# Patient Record
Sex: Female | Born: 1980 | Race: White | Hispanic: No | Marital: Single | Smoking: Current every day smoker
Health system: Southern US, Community
[De-identification: ages and names within clinical notes are randomized; demographics above are authoritative.]

## PROBLEM LIST (undated history)

## (undated) DIAGNOSIS — F419 Anxiety disorder, unspecified: Secondary | ICD-10-CM

## (undated) DIAGNOSIS — Z5189 Encounter for other specified aftercare: Secondary | ICD-10-CM

## (undated) DIAGNOSIS — M545 Low back pain: Secondary | ICD-10-CM

## (undated) HISTORY — PX: VAGINAL HYSTERECTOMY: SUR661

## (undated) HISTORY — DX: Low back pain: M54.5

## (undated) HISTORY — PX: CHOLECYSTECTOMY: SHX55

## (undated) HISTORY — DX: Anxiety disorder, unspecified: F41.9

## (undated) HISTORY — PX: DIAGNOSTIC LAPAROSCOPY: SUR761

## (undated) HISTORY — PX: FOOT SURGERY: SHX648

## (undated) HISTORY — DX: Encounter for other specified aftercare: Z51.89

---

## 2005-07-29 ENCOUNTER — Ambulatory Visit: Payer: Self-pay | Admitting: Unknown Physician Specialty

## 2005-09-19 ENCOUNTER — Emergency Department: Payer: Self-pay | Admitting: Emergency Medicine

## 2006-02-19 ENCOUNTER — Encounter: Payer: Self-pay | Admitting: Family Medicine

## 2006-05-22 ENCOUNTER — Encounter: Payer: Self-pay | Admitting: Family Medicine

## 2006-08-07 ENCOUNTER — Emergency Department: Payer: Self-pay | Admitting: Emergency Medicine

## 2008-06-05 ENCOUNTER — Encounter: Payer: Self-pay | Admitting: Family Medicine

## 2009-06-27 ENCOUNTER — Emergency Department: Payer: Self-pay | Admitting: Emergency Medicine

## 2009-11-05 ENCOUNTER — Ambulatory Visit (HOSPITAL_COMMUNITY): Admission: RE | Admit: 2009-11-05 | Discharge: 2009-11-05 | Payer: Self-pay | Admitting: Specialist

## 2010-03-20 ENCOUNTER — Encounter: Payer: Self-pay | Admitting: Family Medicine

## 2010-03-20 ENCOUNTER — Emergency Department: Payer: Self-pay | Admitting: Emergency Medicine

## 2010-10-25 ENCOUNTER — Other Ambulatory Visit: Payer: Self-pay | Admitting: Family Medicine

## 2010-10-25 ENCOUNTER — Ambulatory Visit (INDEPENDENT_AMBULATORY_CARE_PROVIDER_SITE_OTHER): Payer: PRIVATE HEALTH INSURANCE | Admitting: Family Medicine

## 2010-10-25 ENCOUNTER — Encounter: Payer: Self-pay | Admitting: Family Medicine

## 2010-10-25 DIAGNOSIS — R599 Enlarged lymph nodes, unspecified: Secondary | ICD-10-CM

## 2010-10-25 DIAGNOSIS — E119 Type 2 diabetes mellitus without complications: Secondary | ICD-10-CM

## 2010-10-25 DIAGNOSIS — E0865 Diabetes mellitus due to underlying condition with hyperglycemia: Secondary | ICD-10-CM | POA: Insufficient documentation

## 2010-10-25 DIAGNOSIS — R221 Localized swelling, mass and lump, neck: Secondary | ICD-10-CM

## 2010-10-25 DIAGNOSIS — Z136 Encounter for screening for cardiovascular disorders: Secondary | ICD-10-CM

## 2010-10-25 DIAGNOSIS — R220 Localized swelling, mass and lump, head: Secondary | ICD-10-CM

## 2010-10-25 LAB — LIPID PANEL
Cholesterol: 170 mg/dL (ref 0–200)
HDL: 34 mg/dL — ABNORMAL LOW (ref >39.00)
LDL Cholesterol: 110 mg/dL — ABNORMAL HIGH (ref 0–99)
Total CHOL/HDL Ratio: 5
Triglycerides: 128 mg/dL (ref 0.0–149.0)
VLDL: 25.6 mg/dL (ref 0.0–40.0)

## 2010-10-25 LAB — BASIC METABOLIC PANEL
BUN: 12 mg/dL (ref 6–23)
CO2: 27 mEq/L (ref 19–32)
Calcium: 9.4 mg/dL (ref 8.4–10.5)
Chloride: 101 mEq/L (ref 96–112)
Creatinine, Ser: 0.9 mg/dL (ref 0.4–1.2)
GFR: 76.36 mL/min (ref >60.00)
Glucose, Bld: 215 mg/dL — ABNORMAL HIGH (ref 70–99)
Potassium: 4.4 mEq/L (ref 3.5–5.1)
Sodium: 138 mEq/L (ref 135–145)

## 2010-10-25 LAB — HEMOGLOBIN A1C: Hgb A1c MFr Bld: 8.7 % — ABNORMAL HIGH (ref 4.6–6.5)

## 2010-10-29 ENCOUNTER — Ambulatory Visit: Payer: Self-pay | Admitting: Family Medicine

## 2010-10-29 ENCOUNTER — Encounter: Payer: Self-pay | Admitting: Family Medicine

## 2010-10-31 ENCOUNTER — Encounter: Payer: Self-pay | Admitting: Family Medicine

## 2010-10-31 NOTE — Assessment & Plan Note (Signed)
Summary: NEW PT TO EST/CHECK LUMP UNDER R ARM/CLE     [facility],MAILED NPP   Vital Signs:  Patient profile:   30 year old female Height:      72 inches Weight:      275.50 pounds BMI:     37.50 Temp:     98.3 degrees F oral Pulse rate:   72 / minute Pulse rhythm:   regular BP sitting:   130 / 80  (right arm) Cuff size:   large  Vitals Entered By: Linde Gillis CMA Duncan Dull) (October 25, 2010 10:45 AM) CC: new patient, check lump under right arm   History of Present Illness: 30 yo here to establish care.  1.  Lump under right axilla- noticed it a few weeks ago.  Not painful, no erythema, non fluctuant.  Went to PCP, placed her on Doxycycline which had no affect on it.  No increase in size.  She is concerned because her mother died of breast CA in her 70s and her mother's sister died at 10 of breast CA.   Nitara has never had a mammogram. We do not know hormone or BRCA status of her mother, but Kjerstin was checked for BRCA and was neg.  She has not noticed any lumps in breast.  No changes in her nipples or discharge. No fevers or chills.  2.  Lump on right side of her neck.  Has been there for years and at times can be very painful.  No erythema or warmth.  Has also been on doxycycline for this which has had no impact on it either.  She does not think it has grown in size.  3.  h/o DM- diagnosed with DM and was on Metformin combo mediation.  It upset her stomach.  Went to bariatric clinic, on phenteramine, lost weight and was able to control her DM with diet.  She stopped the phenteramine and gained some weight back.  Just restarted it last month and has lost 10 pounds.  Denies any increased thirst or urination.  Preventive Screening-Counseling & Management  Alcohol-Tobacco     Smoking Status: never  Current Medications (verified): 1)  Phentermine Hcl 37.5 Mg Caps (Phentermine Hcl) .... 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 Eyes:  Denies blurring. ENT:  Denies difficulty swallowing. CV:  Denies chest pain or discomfort. Resp:  Denies shortness of breath. GI:  Denies abdominal pain, change in bowel habits, nausea, and vomiting. GU:  Denies discharge and dysuria. MS:  Denies joint pain, joint redness, and joint swelling. Derm:  Denies rash. Neuro:  Denies headaches. Psych:  Denies anxiety and depression. Endo:  Denies cold intolerance, heat intolerance, and polyuria.  Heme:  Complains of enlarge lymph nodes; denies fevers.  Physical Exam  General:  alert and overweight-appearing.  alert and overweight-appearing.   Head:  normocephalic, atraumatic, and no abnormalities observed.  normocephalic, atraumatic, and no abnormalities observed.   Eyes:  vision grossly intact, pupils equal, pupils round, and pupils reactive to light.  vision grossly intact, pupils equal, pupils round, and pupils reactive to light.   Ears:  R ear normal and L ear normal.  R ear normal and L ear normal.   Nose:  no external deformity.  no external deformity.   Mouth:  good dentition.  good dentition.   Neck:  supple  and full ROM.   palpable, firm, circular 1.5 cm nodule right sterno-mastoid area.supple and full ROM.   Breasts:  No mass, nodules, thickening, tenderness, bulging, retraction, inflamation, nipple discharge or skin changes noted.   Lungs:  Normal respiratory effort, chest expands symmetrically. Lungs are clear to auscultation, no crackles or wheezes. Heart:  Normal rate and regular rhythm. S1 and S2 normal without gallop, murmur, click, rub or other extra sounds. Abdomen:  Bowel sounds positive,abdomen soft and non-tender without masses, organomegaly or hernias noted. Msk:  No deformity or scoliosis noted of thoracic or lumbar spine.   Extremities:  no edema Neurologic:  No cranial nerve deficits noted. Station and gait are normal. Plantar reflexes are down-going bilaterally. DTRs are symmetrical throughout. Sensory, motor and coordinative functions appear intact. Skin:  Intact without suspicious lesions or rashes Cervical Nodes:  see neck Axillary Nodes:  R axillary LN enlarged, non tender, no erythema.R axillary LN enlarged.   Psych:  Cognition and judgment appear intact. Alert and cooperative with normal attention span and concentration. No apparent delusions, illusions, hallucinations   Impression & Recommendations:  Problem # 1:  DIABETES MELLITUS, TYPE II (ICD-250.00) Assessment Unchanged recheck a1c today.  Likely will need to restart medication and discussed diabetic friendly diet.  Problem # 2:  LYMPHADENOPATHY, RIGHT AXILLA (ICD-785.6) Assessment: New hopefully benign lymphadenopathy.  since it is non tender and with her strong FH of breast CA, will refer for ultrasound/mammogram of her bilateral breast and right axilla.  Pt in agreement with plan. Orders: Radiology Referral (Radiology)  Problem # 3:  NECK MASS (ICD-784.2) Assessment: New Cyst vs reactive node.  Given how long it has been there and now causing pain, will refer for ultrasound. Orders: Radiology Referral  (Radiology)  Complete Medication List: 1)  Phentermine Hcl 37.5 Mg Caps (Phentermine hcl) .... Take one tablet by mouth daily  Other Orders: Venipuncture (34742) TLB-Lipid Panel (80061-LIPID) TLB-BMP (Basic Metabolic Panel-BMET) (80048-METABOL) TLB-A1C / Hgb A1C (Glycohemoglobin) (83036-A1C)  Patient Instructions: 1)  Please stop by to see Shirlee Limerick on your way out. 2)  At your convenience, please make an appointment for a complete physical/pap smear.   Orders Added: 1)  Venipuncture [36415] 2)  TLB-Lipid Panel [80061-LIPID] 3)  TLB-BMP (Basic Metabolic Panel-BMET) [80048-METABOL] 4)  TLB-A1C / Hgb A1C (Glycohemoglobin) [83036-A1C] 5)  Radiology Referral [Radiology] 6)  New Patient Level III [59563]    Current Allergies (reviewed today): No known allergies

## 2010-11-05 ENCOUNTER — Encounter: Payer: Self-pay | Admitting: Family Medicine

## 2010-11-05 ENCOUNTER — Ambulatory Visit: Payer: Self-pay | Admitting: Family Medicine

## 2010-11-05 HISTORY — PX: BREAST BIOPSY: SHX20

## 2010-11-06 ENCOUNTER — Encounter: Payer: Self-pay | Admitting: Family Medicine

## 2010-11-06 DIAGNOSIS — N63 Unspecified lump in unspecified breast: Secondary | ICD-10-CM | POA: Insufficient documentation

## 2010-11-07 ENCOUNTER — Telehealth: Payer: Self-pay | Admitting: Family Medicine

## 2010-11-07 ENCOUNTER — Other Ambulatory Visit: Payer: Self-pay | Admitting: Family Medicine

## 2010-11-07 DIAGNOSIS — N6310 Unspecified lump in the right breast, unspecified quadrant: Secondary | ICD-10-CM

## 2010-11-12 ENCOUNTER — Other Ambulatory Visit: Payer: PRIVATE HEALTH INSURANCE

## 2010-11-12 NOTE — Procedures (Signed)
Summary: Spirometry Report   Spirometry Report   Imported By: Kassie Mends 11/04/2010 10:40:52  _____________________________________________________________________  External Attachment:    Type:   Image     Comment:   External Document

## 2010-11-12 NOTE — Miscellaneous (Signed)
Summary: Orders Update  Clinical Lists Changes  Problems: Added new problem of LUMP OR MASS IN BREAST (ICD-611.72) Orders: Added new Referral order of Surgical Referral (Surgery) - Signed

## 2010-11-12 NOTE — Letter (Signed)
Summary: [facility]   Imported By: Kassie Mends 11/05/2010 08:29:39  _____________________________________________________________________  External Attachment:    Type:   Image     Comment:   External Document

## 2010-11-12 NOTE — Progress Notes (Signed)
  Phone Note From Other Clinic   Caller: Referral Coordinator Summary of Call: Wanted to let you know that the patient is going to go to the [facility] for a double core biopsy on 11/13/2010 at 8:00am. The surgeons office in [place] could not see her for 2 weeks and she didnt want to wait. Patient is aware of the appt and she will bring her MMG disc with her to the appt. Initial call taken by: Carlton Adam,  November 07, 2010 9:00 AM  Follow-up for Phone Call        Thank you. Ruthe Mannan MD  November 07, 2010 9:37 AM

## 2010-11-13 ENCOUNTER — Other Ambulatory Visit: Payer: PRIVATE HEALTH INSURANCE

## 2010-11-13 ENCOUNTER — Other Ambulatory Visit: Payer: Self-pay | Admitting: Family Medicine

## 2010-11-13 ENCOUNTER — Ambulatory Visit
Admission: RE | Admit: 2010-11-13 | Discharge: 2010-11-13 | Disposition: A | Discharge status: Home / Self Care | Payer: PRIVATE HEALTH INSURANCE | Source: Ambulatory Visit | Attending: Family Medicine | Admitting: Family Medicine

## 2010-11-13 ENCOUNTER — Other Ambulatory Visit: Payer: Self-pay | Admitting: Diagnostic Radiology

## 2010-11-13 DIAGNOSIS — N6310 Unspecified lump in the right breast, unspecified quadrant: Secondary | ICD-10-CM

## 2010-12-10 ENCOUNTER — Other Ambulatory Visit: Payer: Self-pay | Admitting: *Deleted

## 2010-12-10 MED ORDER — METFORMIN HCL 500 MG PO TABS: 500.0000 mg | ORAL_TABLET | Freq: Two times a day (BID) | ORAL | Status: DC

## 2011-01-10 ENCOUNTER — Telehealth: Payer: Self-pay | Admitting: *Deleted

## 2011-01-10 MED ORDER — FLUCONAZOLE 150 MG PO TABS: 150.0000 mg | ORAL_TABLET | Freq: Once | ORAL | Status: AC

## 2011-01-10 NOTE — Telephone Encounter (Signed)
Rx sent but if symptoms continue, must be evaluated.

## 2011-01-10 NOTE — Telephone Encounter (Signed)
Pt is complaining of yeast infection- itching, burning, discharge- and requests that diflucan be called to [facility] in [place].

## 2011-01-10 NOTE — Telephone Encounter (Signed)
Patient advised as instructed via telephone. 

## 2011-03-10 ENCOUNTER — Ambulatory Visit (INDEPENDENT_AMBULATORY_CARE_PROVIDER_SITE_OTHER): Payer: PRIVATE HEALTH INSURANCE | Admitting: Family Medicine

## 2011-03-10 ENCOUNTER — Encounter: Payer: Self-pay | Admitting: Family Medicine

## 2011-03-10 VITALS — BP 120/90 | HR 88 | Temp 98.0°F | Ht 71.0 in | Wt 273.0 lb

## 2011-03-10 DIAGNOSIS — M549 Dorsalgia, unspecified: Secondary | ICD-10-CM

## 2011-03-10 MED ORDER — PREDNISONE 20 MG PO TABS: ORAL_TABLET | ORAL | Status: DC

## 2011-03-10 NOTE — Patient Instructions (Signed)
You have musculoskeletal low back pain or perhaps a herniated disc.  Take tylenol for baseline pain relief (1-2 extra strength tabs 3x/day)  Follow the directions for prednsione.  Stay as active as possible.  Consider massage, chiropractor, physical therapy, and/or acupuncture. Physical therapy has been shown to be helpful while the others have mixed results. Strengthening of low back muscles, abdominal musculature are key for long term pain relief. If not improving, will consider further imaging (MRI) and/or other medications (neurontin, lyrica, nortriptyline) that help with pain.

## 2011-03-10 NOTE — Progress Notes (Signed)
Subjective:    Hannah Foley is a 30 y.o. female who presents for evaluation of low back pain. The patient has had recurrent self limited episodes of low back pain in the past. Symptoms have been present for 7 days and are unchanged.  Onset was related to / precipitated by no known injury. The pain is located in the right lumbar area and radiates to the right lower leg. The pain is described as aching and stiffness and occurs intermittently.. Symptoms are exacerbated by coughing, flexion and standing. Symptoms are improved by NSAIDs. She has also tried acetaminophen which provided no symptom relief. She has no other symptoms associated with the back pain. The patient has no "red flag" history indicative of complicated back pain.  The following portions of the patient's history were reviewed and updated as appropriate: problem list.  Review of Systems Pertinent items are noted in HPI.    Objective:  BP 120/90  Pulse 88  Temp(Src) 98 F (36.7 C) (Oral)  Ht 5\' 11"  (1.803 m)  Wt 273 lb (123.832 kg)  BMI 38.08 kg/m2 Gen:  Alert, pleaseant, NAD MSK:  No paraspinous tenderness, SLR pos right, neg left Neuro:  Normal gait, grossly intact    Assessment:    Sciatica possibly due to degenerative joint disease at intervertebral facet joints    Plan:    Educational material distributed. Proper lifting, bending technique discussed. Stretching exercises discussed. prednisone taper.  See pt instructions for details.

## 2011-03-12 ENCOUNTER — Other Ambulatory Visit: Payer: Self-pay | Admitting: Family Medicine

## 2011-03-12 ENCOUNTER — Telehealth: Payer: Self-pay | Admitting: *Deleted

## 2011-03-12 ENCOUNTER — Ambulatory Visit (INDEPENDENT_AMBULATORY_CARE_PROVIDER_SITE_OTHER)
Admission: RE | Admit: 2011-03-12 | Discharge: 2011-03-12 | Disposition: A | Discharge status: Home / Self Care | Payer: PRIVATE HEALTH INSURANCE | Source: Ambulatory Visit | Attending: Family Medicine | Admitting: Family Medicine

## 2011-03-12 DIAGNOSIS — M549 Dorsalgia, unspecified: Secondary | ICD-10-CM

## 2011-03-12 DIAGNOSIS — M545 Low back pain, unspecified: Secondary | ICD-10-CM | POA: Insufficient documentation

## 2011-03-12 HISTORY — DX: Low back pain, unspecified: M54.50

## 2011-03-12 HISTORY — DX: Low back pain: M54.5

## 2011-03-12 NOTE — Telephone Encounter (Signed)
Let's go ahead and get an xray of her back.  I will place order and she can come here to do it.

## 2011-03-12 NOTE — Telephone Encounter (Signed)
Patient advised as instructed via telephone.  She will come in this afternoon for the xray.

## 2011-03-12 NOTE — Telephone Encounter (Signed)
Patient was seen last week for back pain and she says that it isn't doing any better. She is asking what you would recommend at this point. Please advise.

## 2011-03-15 ENCOUNTER — Ambulatory Visit
Admission: RE | Admit: 2011-03-15 | Discharge: 2011-03-15 | Disposition: A | Discharge status: Home / Self Care | Payer: PRIVATE HEALTH INSURANCE | Source: Ambulatory Visit | Attending: Family Medicine | Admitting: Family Medicine

## 2011-03-15 DIAGNOSIS — M545 Low back pain, unspecified: Secondary | ICD-10-CM

## 2011-03-17 ENCOUNTER — Other Ambulatory Visit: Payer: Self-pay | Admitting: Family Medicine

## 2011-03-17 DIAGNOSIS — IMO0002 Reserved for concepts with insufficient information to code with codable children: Secondary | ICD-10-CM

## 2011-03-21 ENCOUNTER — Other Ambulatory Visit: Payer: Self-pay | Admitting: *Deleted

## 2011-03-21 MED ORDER — FLUCONAZOLE 150 MG PO TABS: 150.0000 mg | ORAL_TABLET | Freq: Once | ORAL | Status: DC

## 2011-03-21 NOTE — Telephone Encounter (Signed)
Patient advised as instructed via telephone. 

## 2011-03-21 NOTE — Telephone Encounter (Signed)
Patient called requesting a Rx for Diflucan.  She stated that she just finished Prednisone and now has a yeast infection.  Please advise.  Uses [facility] in [place].

## 2011-07-24 ENCOUNTER — Other Ambulatory Visit: Payer: Self-pay | Admitting: Family Medicine

## 2011-07-24 DIAGNOSIS — N809 Endometriosis, unspecified: Secondary | ICD-10-CM | POA: Insufficient documentation

## 2012-01-21 ENCOUNTER — Encounter: Payer: Self-pay | Admitting: Family Medicine

## 2012-02-02 ENCOUNTER — Ambulatory Visit (INDEPENDENT_AMBULATORY_CARE_PROVIDER_SITE_OTHER): Payer: PRIVATE HEALTH INSURANCE | Admitting: Family Medicine

## 2012-02-02 ENCOUNTER — Encounter: Payer: Self-pay | Admitting: Family Medicine

## 2012-02-02 VITALS — BP 120/86 | HR 76 | Temp 98.1°F | Wt 261.0 lb

## 2012-02-02 DIAGNOSIS — J069 Acute upper respiratory infection, unspecified: Secondary | ICD-10-CM

## 2012-02-02 MED ORDER — AMOXICILLIN-POT CLAVULANATE 875-125 MG PO TABS: 1.0000 | ORAL_TABLET | Freq: Two times a day (BID) | ORAL | Status: AC

## 2012-02-02 MED ORDER — FLUCONAZOLE 150 MG PO TABS: 150.0000 mg | ORAL_TABLET | Freq: Once | ORAL | Status: AC

## 2012-02-02 MED ORDER — METFORMIN HCL 500 MG PO TABS: 500.0000 mg | ORAL_TABLET | Freq: Two times a day (BID) | ORAL | Status: DC

## 2012-02-02 NOTE — Progress Notes (Signed)
SUBJECTIVE:  Hannah Foley is a 31 y.o. female who complains of coryza, congestion, sneezing, sore throat, dry cough, myalgias and bilateral sinus pain for 14 days. She denies a history of anorexia, chest pain, chills, dizziness, fatigue and fevers and denies a history of asthma. Patient denies smoke cigarettes.   Patient Active Problem List  Diagnoses  . Type II or unspecified type diabetes mellitus without mention of complication, not stated as uncontrolled  . NECK MASS  . LYMPHADENOPATHY, RIGHT AXILLA  . LUMP OR MASS IN BREAST  . Low back pain  . Endometriosis   Past Medical History  Diagnosis Date  . Diabetes mellitus     type 2   Past Surgical History  Procedure Date  . Cholecystectomy   . Vaginal hysterectomy     laproscopy, hyst for endometriosis   History  Substance Use Topics  . Smoking status: Former Games developer  . Smokeless tobacco: Not on file  . Alcohol Use: Yes   Family History  Problem Relation Age of Onset  . Cancer Paternal Aunt 52    breast  . Cancer Other     breast   No Known Allergies Current Outpatient Prescriptions on File Prior to Visit  Medication Sig Dispense Refill  . metFORMIN (GLUCOPHAGE) 500 MG tablet Take 1 tablet (500 mg total) by mouth 2 (two) times daily with a meal.  60 tablet  6   The PMH, PSH, Social History, Family History, Medications, and allergies have been reviewed in [facility], and have been updated if relevant.  OBJECTIVE: BP 120/86  Pulse 76  Temp(Src) 98.1 F (36.7 C) (Oral)  Wt 261 lb (118.389 kg)  She appears well, vital signs are as noted. Left TM bulging.  Throat and pharynx normal.  Neck supple. No adenopathy in the neck. Nose is congested. Sinuses non tender. The chest is clear, without wheezes or rales.  ASSESSMENT:  otitis media and sinusitis  PLAN: Given duration and progression of symptoms, will treat for bacterial sinusitis with amoxicillin. Symptomatic therapy suggested: push fluids, rest and return office visit  prn if symptoms persist or worsen. Call or return to clinic prn if these symptoms worsen or fail to improve as anticipated.

## 2012-02-06 ENCOUNTER — Telehealth: Payer: Self-pay | Admitting: Family Medicine

## 2012-02-06 NOTE — Telephone Encounter (Signed)
Caller: Redith/Patient; PCP: Gwinda Passe); CB#: [phone]; Call regarding Sore Throat; follows OV 02/02/12, was given Augmentin for ear infection, states still having difficulty swallowing with congestion, afebrile. LMP ~ 2 weeks ago, not using birth control. Not using any OTC products for congestion. Symptoms reviewed URI Guideline, with continued symptoms, Onset  2 weeks of symptoms, pt states was told to call if no improvement, calling for recommendations.

## 2012-02-06 NOTE — Telephone Encounter (Signed)
Please advise: Finish augmentin as prescribed.  Treat sympotmatically with Mucinex, nasal saline irrigation, and Tylenol/Ibuprofen. Also try claritin D or zyrtec D over the counter- two times a day as needed ( have to sign for them at pharmacy). You can use warm compresses.  Cough suppressant at night. Call if not improving as expected in 5-7 days.

## 2012-02-06 NOTE — Telephone Encounter (Signed)
Advised patient

## 2012-07-03 ENCOUNTER — Ambulatory Visit: Payer: Self-pay

## 2012-07-06 ENCOUNTER — Ambulatory Visit (INDEPENDENT_AMBULATORY_CARE_PROVIDER_SITE_OTHER): Payer: Commercial Managed Care - PPO | Admitting: Family Medicine

## 2012-07-06 VITALS — BP 110/86 | Temp 97.8°F | Wt 249.0 lb

## 2012-07-06 DIAGNOSIS — J40 Bronchitis, not specified as acute or chronic: Secondary | ICD-10-CM

## 2012-07-06 MED ORDER — LEVOFLOXACIN 500 MG PO TABS: 500.0000 mg | ORAL_TABLET | Freq: Every day | ORAL | Status: DC

## 2012-07-06 MED ORDER — DEXAMETHASONE SOD PHOSPHATE PF 10 MG/ML IJ SOLN
10.0000 mg | Freq: Once | INTRAMUSCULAR | Status: AC
  Administered 2012-07-06: 10 mg via INTRAMUSCULAR

## 2012-07-06 NOTE — Patient Instructions (Addendum)
Please take Levaquin as directed. Continue using your inhaler.  Call me in a 1-2 days with an update.

## 2012-07-06 NOTE — Addendum Note (Signed)
Addended by: Eliezer Bottom on: 07/06/2012 12:14 PM   Modules accepted: Orders

## 2012-07-06 NOTE — Progress Notes (Signed)
SUBJECTIVE:  Hannah Foley is a 31 y.o. female who complains of coryza, congestion, wheezing, productive cough, chills x 14 days.  Went to UC 4 days ago- given Zpack and albuterol.  Feels no better, actually feels worse.  Cough is more productive.   Husband being treated for PNA. Patient Active Problem List  Diagnosis  . Type II or unspecified type diabetes mellitus without mention of complication, not stated as uncontrolled  . NECK MASS  . LYMPHADENOPATHY, RIGHT AXILLA  . LUMP OR MASS IN BREAST  . Low back pain  . Endometriosis  . Bronchitis   Past Medical History  Diagnosis Date  . Diabetes mellitus     type 2   Past Surgical History  Procedure Date  . Cholecystectomy   . Vaginal hysterectomy     laproscopy, hyst for endometriosis   History  Substance Use Topics  . Smoking status: Former Games developer  . Smokeless tobacco: Not on file  . Alcohol Use: Yes   Family History  Problem Relation Age of Onset  . Cancer Paternal Aunt 88    breast  . Cancer Other     breast   No Known Allergies Current Outpatient Prescriptions on File Prior to Visit  Medication Sig Dispense Refill  . metFORMIN (GLUCOPHAGE) 500 MG tablet Take 1 tablet (500 mg total) by mouth 2 (two) times daily with a meal.  60 tablet  6   The PMH, PSH, Social History, Family History, Medications, and allergies have been reviewed in [facility], and have been updated if relevant.  OBJECTIVE: BP 110/86  Temp 97.8 F (36.6 C)  Wt 249 lb (112.946 kg)  SpO2 96%   She appears like she is not feeling well but no increased WOB Resp:  Diffuse wheezes throughout, scattered rhonchi CVS:  Tachypneic, RR Ext: no edema.  ASSESSMENT:  Bronchitis/CAP  PLAN: Will broaden coverage with course of levaquin. IM decadron in office today.  Continue prn albuterol.  Call or return to clinic prn if these symptoms worsen or fail to improve as anticipated.

## 2012-07-13 ENCOUNTER — Telehealth: Payer: Self-pay | Admitting: Family Medicine

## 2012-07-13 MED ORDER — LEVALBUTEROL TARTRATE 45 MCG/ACT IN AERO: 1.0000 | INHALATION_SPRAY | RESPIRATORY_TRACT | Status: DC | PRN

## 2012-07-13 MED ORDER — PREDNISONE 20 MG PO TABS: ORAL_TABLET | ORAL | Status: DC

## 2012-07-13 NOTE — Telephone Encounter (Signed)
Spoke with patient, she said she wasn't asking for a stronger abx, she just wanted to know what she should do about the wheezing and rattling that she has.  She says she gets really short of breath and inhalers only last a minute.  Please advise.

## 2012-07-13 NOTE — Telephone Encounter (Signed)
Yes that is why I am recommending another either dose of decadron or oral prednisone to help decrease inflammation and open up her airway. We could also try another inhaler like xopenex which would also be an as needed inhaler like albuterol.  I will send xopenex to pharmacy and let me know about prednisone tablets vs decadron.

## 2012-07-13 NOTE — Telephone Encounter (Signed)
Spoke with patient, advised her that xopenex has been sent to pharmacy.  She would like to try prednisone this time to see if it helps.  Please send to [facility] [place].

## 2012-07-13 NOTE — Telephone Encounter (Signed)
Left message asking pt to call back. 

## 2012-07-13 NOTE — Telephone Encounter (Signed)
No cough can persist for weeks and I would not extend her course of levaquin because it is so strong. We could try a Zpack to cover atypical pneumonia. Has she had a fever?  If not, another injection of decadron or oral prednisone tablets would likely be more effective than more antibiotics.

## 2012-07-13 NOTE — Telephone Encounter (Signed)
Rs sent.

## 2012-07-13 NOTE — Telephone Encounter (Signed)
° °  Patient Information:  Caller Name: Ally  Phone: [phone]  Patient: Hannah Foley, Hannah Foley  Gender: Female  DOB: 1980-09-19  Age: 31 Years  PCP: Ruthe Mannan [facility])  Pregnant: No   Symptoms  Reason For Call & Symptoms: Pt was prescribed Levaquin last Tuesday 07/06/12 for bronchitis. She took her last pill yesterday. There has been no improvement. Pt still has cough and mild wheezing (wheezing improved). Pt continues Albuterol 2 puffs QID. Pt is asking if there is another stronger abx that could be called in.  Reviewed Health History In EMR: Yes  Reviewed Medications In EMR: Yes  Reviewed Allergies In EMR: Yes  Date of Onset of Symptoms: 07/02/2012  Treatments Tried: completed treatment of Levaquin; is taking Albuterol  Treatments Tried Worked: No OB:  LMP: 07/13/2012  Guideline(s) Used:  Cough  Disposition Per Guideline:   See Within 3 Days in Office  Reason For Disposition Reached:   Cough has been present for > 10 days  Advice Given:  N/A  Office Follow Up:  Does the office need to follow up with this patient?: Yes  Instructions For The Office: Office please call pt back and advise if she needs to be assessed again of if MD can call in different antibiotic  RN Note:  Office please call pt back and let her know if another abx can be called in or if she should be evaluated again.

## 2013-02-22 ENCOUNTER — Telehealth: Payer: Self-pay

## 2013-02-22 ENCOUNTER — Ambulatory Visit (INDEPENDENT_AMBULATORY_CARE_PROVIDER_SITE_OTHER): Payer: Commercial Managed Care - PPO | Admitting: Family Medicine

## 2013-02-22 VITALS — BP 120/72 | HR 78 | Temp 98.1°F | Ht 70.0 in | Wt 240.8 lb

## 2013-02-22 DIAGNOSIS — Z113 Encounter for screening for infections with a predominantly sexual mode of transmission: Secondary | ICD-10-CM | POA: Insufficient documentation

## 2013-02-22 DIAGNOSIS — N898 Other specified noninflammatory disorders of vagina: Secondary | ICD-10-CM

## 2013-02-22 DIAGNOSIS — B3731 Acute candidiasis of vulva and vagina: Secondary | ICD-10-CM | POA: Insufficient documentation

## 2013-02-22 DIAGNOSIS — B373 Candidiasis of vulva and vagina: Secondary | ICD-10-CM

## 2013-02-22 LAB — POCT WET PREP WITH KOH
Clue Cells Wet Prep HPF POC: NEGATIVE
KOH Prep POC: POSITIVE
Trichomonas, UA: NEGATIVE
Yeast Wet Prep HPF POC: POSITIVE

## 2013-02-22 MED ORDER — FLUCONAZOLE 150 MG PO TABS: 150.0000 mg | ORAL_TABLET | Freq: Once | ORAL | Status: DC

## 2013-02-22 NOTE — Assessment & Plan Note (Signed)
She wishes to screen for GC/CHlam but not blood testing such as HIV etc.  Urine will be performed given pt on menses right now.

## 2013-02-22 NOTE — Patient Instructions (Addendum)
Return first urine of the day for GC/Chlam screen tommorow. Start diflucan x 1 ... May repeat x1 after menses if symptoms continued.

## 2013-02-22 NOTE — Telephone Encounter (Signed)
Pt started 02/21/13 with vaginal discharge with itching and perineal itching. Pt scheduled appt with Dr Ermalene Searing today at 9:45 am.

## 2013-02-22 NOTE — Progress Notes (Signed)
  Subjective:    Patient ID: Hannah Foley, female    DOB: Dec 10, 1980, 32 y.o.   MRN: [id number]  HPI  32 year old female presetns with vaginal itching and discharge x 24 hours. White thick, minimal odor. Started menses this AM. New sexual partner x 1 week ago.. Started with condoms, moved to spermicide for contraception. [place]  She has some concern for STDs such as gonnorhea an chlamydia. She also noted bump on mons pubis, non tender... She feels this is from shaving.   Review of Systems  Constitutional: Negative for fever and fatigue.  HENT: Negative for ear pain.   Eyes: Negative for pain.  Respiratory: Negative for chest tightness and shortness of breath.   Cardiovascular: Negative for chest pain, palpitations and leg swelling.  Gastrointestinal: Negative for abdominal pain.  Genitourinary: Negative for dysuria.       Objective:   Physical Exam  Constitutional: Vital signs are normal. She appears well-developed and well-nourished. She is cooperative.  Non-toxic appearance. She does not appear ill. No distress.  HENT:  Head: Normocephalic.  Right Ear: Hearing, tympanic membrane, external ear and ear canal normal. Tympanic membrane is not erythematous, not retracted and not bulging.  Left Ear: Hearing, tympanic membrane, external ear and ear canal normal. Tympanic membrane is not erythematous, not retracted and not bulging.  Nose: No mucosal edema or rhinorrhea. Right sinus exhibits no maxillary sinus tenderness and no frontal sinus tenderness. Left sinus exhibits no maxillary sinus tenderness and no frontal sinus tenderness.  Mouth/Throat: Uvula is midline, oropharynx is clear and moist and mucous membranes are normal.  Eyes: Conjunctivae, EOM and lids are normal. Pupils are equal, round, and reactive to light. No foreign bodies found.  Neck: Trachea normal and normal range of motion. Neck supple. Carotid bruit is not present. No mass and no  thyromegaly present.  Cardiovascular: Normal rate, regular rhythm, S1 normal, S2 normal, normal heart sounds, intact distal pulses and normal pulses.  Exam reveals no gallop and no friction rub.   No murmur heard. Pulmonary/Chest: Effort normal and breath sounds normal. Not tachypneic. No respiratory distress. She has no decreased breath sounds. She has no wheezes. She has no rhonchi. She has no rales.  Abdominal: Soft. Normal appearance and bowel sounds are normal. There is no tenderness.  Genitourinary: No labial fusion. There is lesion on the right labia. There is no rash or injury on the right labia. There is no rash, tenderness, lesion or injury on the left labia. There is erythema around the vagina. No tenderness or bleeding around the vagina. No foreign body around the vagina. No signs of injury around the vagina. Vaginal discharge found.  Healing boil on right mons, no herpetic lesions  Neurological: She is alert.  Skin: Skin is warm, dry and intact. No rash noted.  Psychiatric: Her speech is normal and behavior is normal. Judgment and thought content normal. Her mood appears not anxious. Cognition and memory are normal. She does not exhibit a depressed mood.          Assessment & Plan:

## 2013-03-18 ENCOUNTER — Ambulatory Visit (INDEPENDENT_AMBULATORY_CARE_PROVIDER_SITE_OTHER): Payer: Commercial Managed Care - PPO | Admitting: Family Medicine

## 2013-03-18 ENCOUNTER — Encounter: Payer: Self-pay | Admitting: Family Medicine

## 2013-03-18 VITALS — BP 120/84 | HR 84 | Temp 98.4°F | Ht 70.0 in | Wt 235.5 lb

## 2013-03-18 DIAGNOSIS — N751 Abscess of Bartholin's gland: Secondary | ICD-10-CM

## 2013-03-18 MED ORDER — FLUCONAZOLE 150 MG PO TABS: 150.0000 mg | ORAL_TABLET | Freq: Once | ORAL | Status: DC

## 2013-03-18 MED ORDER — AMOXICILLIN-POT CLAVULANATE 875-125 MG PO TABS: 1.0000 | ORAL_TABLET | Freq: Two times a day (BID) | ORAL | Status: DC

## 2013-03-18 NOTE — Assessment & Plan Note (Signed)
Pt hesitant about I and D/word catheter. Will start with oral antibiotics and TID sitz baths.  Follow up early next week. If no improvement.. Refer to GYN.

## 2013-03-18 NOTE — Patient Instructions (Addendum)
Start and complete antibiotics.  Sitz baths TID.  Follow up next TUesday with Dr. Ermalene Searing.

## 2013-03-18 NOTE — Progress Notes (Signed)
  Subjective:    Patient ID: Hannah Foley, female    DOB: [birth year], 32 y.o.   MRN: [id number]  HPI  32 year old female pt of Dr. Olena Mater presents with  Bump  After sex 5 days ago.. In inner pasrt of right labia.  Are is getting larger over time...pea to lima bean size. Under skin. Minimally tender.. When she mashes on it. No discharge. No fever. No N/V, no abdominal pain. Soaking area daily in bath. No medication.  She has had similar in the past... Went away on its own.  Review of Systems  Constitutional: Negative for fever and fatigue.  HENT: Negative for ear pain.   Eyes: Negative for pain.  Respiratory: Negative for shortness of breath.   Cardiovascular: Negative for chest pain, palpitations and leg swelling.       Objective:   Physical Exam  Constitutional: Vital signs are normal. She appears well-developed and well-nourished. She is cooperative.  Non-toxic appearance. She does not appear ill. No distress.  HENT:  Head: Normocephalic.  Right Ear: Hearing, tympanic membrane, external ear and ear canal normal. Tympanic membrane is not erythematous, not retracted and not bulging.  Left Ear: Hearing, tympanic membrane, external ear and ear canal normal. Tympanic membrane is not erythematous, not retracted and not bulging.  Nose: No mucosal edema or rhinorrhea. Right sinus exhibits no maxillary sinus tenderness and no frontal sinus tenderness. Left sinus exhibits no maxillary sinus tenderness and no frontal sinus tenderness.  Mouth/Throat: Uvula is midline, oropharynx is clear and moist and mucous membranes are normal.  Eyes: Conjunctivae, EOM and lids are normal. Pupils are equal, round, and reactive to light. No foreign bodies found.  Neck: Trachea normal and normal range of motion. Neck supple. Carotid bruit is not present. No mass and no thyromegaly present.  Cardiovascular: Normal rate, regular rhythm, S1 normal, S2 normal, normal heart sounds, intact distal pulses and normal  pulses.  Exam reveals no gallop and no friction rub.   No murmur heard. Pulmonary/Chest: Effort normal and breath sounds normal. Not tachypneic. No respiratory distress. She has no decreased breath sounds. She has no wheezes. She has no rhonchi. She has no rales.  Abdominal: Soft. Normal appearance and bowel sounds are normal. There is no tenderness.  Genitourinary: There is tenderness and lesion on the right labia. There is no rash on the right labia.  2 cm Bartholin's gland abscess /cyst on right  Neurological: She is alert.  Skin: Skin is warm, dry and intact. No rash noted.  Psychiatric: Her speech is normal and behavior is normal. Judgment and thought content normal. Her mood appears not anxious. Cognition and memory are normal. She does not exhibit a depressed mood.          Assessment & Plan:

## 2013-03-18 NOTE — Addendum Note (Signed)
Addended by: Kerby Nora E on: 03/18/2013 11:36 AM   Modules accepted: Orders

## 2013-03-22 ENCOUNTER — Encounter: Payer: Self-pay | Admitting: Family Medicine

## 2013-03-22 ENCOUNTER — Ambulatory Visit (INDEPENDENT_AMBULATORY_CARE_PROVIDER_SITE_OTHER): Payer: Commercial Managed Care - PPO | Admitting: Family Medicine

## 2013-03-22 VITALS — BP 122/76 | HR 86 | Temp 98.2°F | Wt 230.8 lb

## 2013-03-22 DIAGNOSIS — N751 Abscess of Bartholin's gland: Secondary | ICD-10-CM

## 2013-03-22 NOTE — Progress Notes (Signed)
  Subjective:    Patient ID: Hannah Foley, female    DOB: [birth year], 32 y.o.   MRN: [id number]  HPI  32 year old female pt of Dr. Elmer Sow seen on 7/25 diagnosed with Bartholin's gland abcess.  Started on augmentin BID x 10 days and sitz baths returns for 4 day follow up.  Today she reports that she had some drainage..yellow bloody discharge. Still pea size lump remains.  Now non tender. She does fell tired and nauseous but feels this may be the antibiotics.    Review of Systems  Constitutional: Negative for fever and fatigue.  HENT: Negative for ear pain.   Eyes: Negative for pain.  Respiratory: Negative for chest tightness and shortness of breath.   Cardiovascular: Negative for chest pain, palpitations and leg swelling.  Gastrointestinal: Negative for abdominal pain.  Genitourinary: Negative for dysuria.       Objective:   Physical Exam  Constitutional: Vital signs are normal. She appears well-developed and well-nourished. She is cooperative.  Non-toxic appearance. She does not appear ill. No distress.  HENT:  Head: Normocephalic.  Right Ear: Hearing, tympanic membrane, external ear and ear canal normal. Tympanic membrane is not erythematous, not retracted and not bulging.  Left Ear: Hearing, tympanic membrane, external ear and ear canal normal. Tympanic membrane is not erythematous, not retracted and not bulging.  Nose: No mucosal edema or rhinorrhea. Right sinus exhibits no maxillary sinus tenderness and no frontal sinus tenderness. Left sinus exhibits no maxillary sinus tenderness and no frontal sinus tenderness.  Mouth/Throat: Uvula is midline, oropharynx is clear and moist and mucous membranes are normal.  Eyes: Conjunctivae, EOM and lids are normal. Pupils are equal, round, and reactive to light. No foreign bodies found.  Neck: Trachea normal and normal range of motion. Neck supple. Carotid bruit is not present. No mass and no thyromegaly present.  Cardiovascular: Normal  rate, regular rhythm, S1 normal, S2 normal, normal heart sounds, intact distal pulses and normal pulses.  Exam reveals no gallop and no friction rub.   No murmur heard. Pulmonary/Chest: Effort normal and breath sounds normal. Not tachypneic. No respiratory distress. She has no decreased breath sounds. She has no wheezes. She has no rhonchi. She has no rales.  Abdominal: Soft. Normal appearance and bowel sounds are normal. There is no tenderness.  Genitourinary: There is lesion on the right labia. There is no rash or tenderness on the right labia.  0.5 cm Bartholin's gland cyst on right, nontender, no discharge expressed, firm  Neurological: She is alert.  Skin: Skin is warm, dry and intact. No rash noted.  Psychiatric: Her speech is normal and behavior is normal. Judgment and thought content normal. Her mood appears not anxious. Cognition and memory are normal. She does not exhibit a depressed mood.          Assessment & Plan:

## 2013-03-22 NOTE — Assessment & Plan Note (Signed)
Resolving with antibiotics and sitz baths.  If recurs refer to GYN for word catheter placement.

## 2013-05-13 ENCOUNTER — Emergency Department: Payer: Self-pay | Admitting: Emergency Medicine

## 2013-05-15 LAB — HM DIABETES EYE EXAM

## 2013-05-16 ENCOUNTER — Other Ambulatory Visit: Payer: Self-pay

## 2013-05-16 MED ORDER — FLUCONAZOLE 150 MG PO TABS: 150.0000 mg | ORAL_TABLET | Freq: Once | ORAL | Status: DC

## 2013-05-16 NOTE — Telephone Encounter (Signed)
Pt left vm was seen last week a [facility] ED for stepping on a needle and was placed on antibiotic; now pt has white, thick vaginal discharge,vaginal and perineal itching and request diflucan to [facility].Please advise. [facility] ED record requested by fax.

## 2013-05-16 NOTE — Telephone Encounter (Signed)
Patient notified diflucan sent to pharmacy.

## 2013-05-30 ENCOUNTER — Ambulatory Visit: Payer: Commercial Managed Care - PPO | Admitting: Family Medicine

## 2013-05-30 ENCOUNTER — Ambulatory Visit (INDEPENDENT_AMBULATORY_CARE_PROVIDER_SITE_OTHER): Payer: Commercial Managed Care - PPO | Admitting: Family Medicine

## 2013-05-30 ENCOUNTER — Encounter: Payer: Self-pay | Admitting: Family Medicine

## 2013-05-30 VITALS — BP 124/82 | HR 102 | Temp 98.0°F | Wt 228.8 lb

## 2013-05-30 DIAGNOSIS — B373 Candidiasis of vulva and vagina: Secondary | ICD-10-CM

## 2013-05-30 DIAGNOSIS — F17200 Nicotine dependence, unspecified, uncomplicated: Secondary | ICD-10-CM

## 2013-05-30 DIAGNOSIS — E119 Type 2 diabetes mellitus without complications: Secondary | ICD-10-CM

## 2013-05-30 DIAGNOSIS — J40 Bronchitis, not specified as acute or chronic: Secondary | ICD-10-CM

## 2013-05-30 DIAGNOSIS — B3731 Acute candidiasis of vulva and vagina: Secondary | ICD-10-CM

## 2013-05-30 MED ORDER — FLUCONAZOLE 150 MG PO TABS: 150.0000 mg | ORAL_TABLET | Freq: Once | ORAL | Status: DC

## 2013-05-30 MED ORDER — AZITHROMYCIN 250 MG PO TABS: ORAL_TABLET | ORAL | Status: DC

## 2013-05-30 MED ORDER — WELLBUTRIN 75 MG PO TABS: 75.0000 mg | ORAL_TABLET | Freq: Two times a day (BID) | ORAL | Status: DC

## 2013-05-30 MED ORDER — ALBUTEROL SULFATE HFA 108 (90 BASE) MCG/ACT IN AERS: 2.0000 | INHALATION_SPRAY | Freq: Four times a day (QID) | RESPIRATORY_TRACT | Status: DC | PRN

## 2013-05-30 NOTE — Patient Instructions (Signed)
I've sent in wellbutrin 75mg  twice daily (brand dosing).  Start when you're feeling better.  Look into [email or web address]. You have bronchitis - treat with zpack and albuterol inhaler - three times a day for next 3 days then as needed. Push fluids and plenty of rest. Use plain mucinex or immediate release guaifenesin with plenty of water to mobilize mucous. Continue otc cough medicine as needed. If fever or worsening productive cough or not improving as expected, return to see [place]. Schedule appointment with Dr. Dayton Martes for routine follow up in next few months - to check sugars.

## 2013-05-30 NOTE — Assessment & Plan Note (Signed)
Sent in brand wellbutrin per pt request - has had good success with this in past. Discussed to notify us if tolerating well and not effective for increase in dose.

## 2013-05-30 NOTE — Assessment & Plan Note (Signed)
[facility] script for diflucan per pt request.

## 2013-05-30 NOTE — Progress Notes (Signed)
  Subjective:    Patient ID: Hannah Foley, female    DOB: [birth year], 32 y.o.   MRN: [id number]  HPI CC: cough  Recent trip to beach - started feeling sick on Friday.  3d h/o cough , progressively worsening.  Productive of mild mucous.  Headache from coughing.  Chest congestion.  Ear pain.  Some wheezing, short winded. Has tried benadryl and OTC tussin.  No fevers/chills, ST, abd pain, tooth pain.  No sick contacts at home. Pt smokes - 1 ppd - just restarted (prior had quit for 4 years).  Insurance will cover wellbutrin - this worked well in the past.  wellbutrin brand (generic caused dizziness) would like script. No h/o asthma, COPD.  H/o bronchitis in the past.  H/o DM - hasn't checked sugars recently.  No recent A1c. Wt Readings from Last 3 Encounters:  05/30/13 228 lb 12 oz (103.76 kg)  03/22/13 230 lb 12 oz (104.668 kg)  03/18/13 235 lb 8 oz (106.822 kg)    Lab Results  Component Value Date   HGBA1C 8.7* 10/25/2010    Past Medical History  Diagnosis Date  . Diabetes mellitus     type 2     Review of Systems Per HPI    Objective:   Physical Exam  Vitals reviewed. Constitutional: She appears well-developed and well-nourished. No distress.  HENT:  Head: Normocephalic and atraumatic.  Right Ear: Hearing, tympanic membrane, external ear and ear canal normal.  Left Ear: Hearing, tympanic membrane, external ear and ear canal normal.  Nose: No mucosal edema or rhinorrhea. Right sinus exhibits no maxillary sinus tenderness and no frontal sinus tenderness. Left sinus exhibits no maxillary sinus tenderness and no frontal sinus tenderness.  Mouth/Throat: Uvula is midline and mucous membranes are normal. Posterior oropharyngeal erythema present. No oropharyngeal exudate, posterior oropharyngeal edema or tonsillar abscesses.  Eyes: Conjunctivae and EOM are normal. Pupils are equal, round, and reactive to light. No scleral icterus.  Neck: Normal range of motion. Neck supple.   Cardiovascular: Normal rate, regular rhythm, normal heart sounds and intact distal pulses.   No murmur heard. Pulmonary/Chest: Effort normal. No respiratory distress. She has decreased breath sounds. She has no wheezes (faint end exp). She has rhonchi (scattered diffusely mainly bilat upper lobes). She has no rales.  Lymphadenopathy:    She has no cervical adenopathy.  Skin: Skin is warm and dry. No rash noted.       Assessment & Plan:

## 2013-05-30 NOTE — Assessment & Plan Note (Signed)
rec schedule routine f/u with PCP in next 1-2 months.

## 2013-05-30 NOTE — Assessment & Plan Note (Signed)
In smoker and likely uncontrolled diabetic. Treat with zpack, albuterol inhaler, supportive care as per instructions. Continue OTC cough medicines. Update if worsening or not improving as expected - red flags to return discussed.

## 2013-05-31 ENCOUNTER — Telehealth: Payer: Self-pay | Admitting: Family Medicine

## 2013-05-31 NOTE — Telephone Encounter (Signed)
Please have her increase albuterol inhaler to every 4 hours as needed.  If not better with this, will need to return for re evaluation.

## 2013-05-31 NOTE — Telephone Encounter (Signed)
Pt given inhaler and medicine yesterday.  She can only use the inhaler every 6 hours and it is not relieving her chest tightness.  She said she needs something she can use more often or in between.  Pharmacy: [facility] in [place]

## 2013-06-01 NOTE — Telephone Encounter (Signed)
Pt called back; still tightness and congestion in chest. Patient notified as instructed by telephone. Offered pt a f/u appt today but pt said she will try the albuterol inhaler q4h prn and if not better tomorrow will cb for f/u appt. Pt advised if condition were to change or worsen tonight to go to UC if necessary. Pt voiced understanding.

## 2013-06-02 ENCOUNTER — Ambulatory Visit: Payer: Commercial Managed Care - PPO | Admitting: Family Medicine

## 2013-06-02 DIAGNOSIS — Z0289 Encounter for other administrative examinations: Secondary | ICD-10-CM

## 2013-06-14 ENCOUNTER — Ambulatory Visit (INDEPENDENT_AMBULATORY_CARE_PROVIDER_SITE_OTHER): Payer: Commercial Managed Care - PPO | Admitting: Family Medicine

## 2013-06-14 ENCOUNTER — Telehealth: Payer: Self-pay

## 2013-06-14 ENCOUNTER — Encounter: Payer: Self-pay | Admitting: Family Medicine

## 2013-06-14 VITALS — BP 116/82 | HR 88 | Temp 98.1°F | Ht 70.0 in | Wt 227.8 lb

## 2013-06-14 DIAGNOSIS — IMO0001 Reserved for inherently not codable concepts without codable children: Secondary | ICD-10-CM

## 2013-06-14 DIAGNOSIS — J40 Bronchitis, not specified as acute or chronic: Secondary | ICD-10-CM

## 2013-06-14 LAB — HM DIABETES FOOT EXAM

## 2013-06-14 MED ORDER — LIRAGLUTIDE 18 MG/3ML ~~LOC~~ SOPN: 0.6000 mg | PEN_INJECTOR | Freq: Every day | SUBCUTANEOUS | Status: DC

## 2013-06-14 NOTE — Progress Notes (Signed)
Subjective:    Patient ID: Hannah Foley, female    DOB: 09-18-1980, 32 y.o.   MRN: [id number]  HPI  32 year old female pt of Dr. Elmer Sow previously well controlled diabetic ( per pt) on diet control presents with recent elevation of A1C ot GYN office.  A1C at GYN 10. ? last week.  At our office she has not been seen for A1C  Or DM visit since 2012. Lab Results  Component Value Date   HGBA1C 8.7* 10/25/2010   She reports she has been feeling well except recent bronchitis, on antibiotics. She has not had increase in thirst, no urinary frequency, no fatigue. She has been eating better in last 3 months.  No exercise.  She has not been checking blood sugars at home.  Wt Readings from Last 3 Encounters:  06/14/13 227 lb 12 oz (103.307 kg)  05/30/13 228 lb 12 oz (103.76 kg)  03/22/13 230 lb 12 oz (104.668 kg)   She was on metformin in past for DM.Marland Kitchen But made her feel sluggish. Januvia in past tolerated.  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   Mother with DM.    Review of Systems  Constitutional: Negative for fever and fatigue.  HENT: Negative for ear pain.   Eyes: Negative for pain.  Respiratory: Positive for cough and chest tightness. Negative for shortness of breath.        Secondary to bronchitis.  Cardiovascular: Negative for chest pain, palpitations and leg swelling.  Gastrointestinal: Negative for abdominal pain.  Genitourinary: Negative for dysuria.       Objective:   Physical Exam  Constitutional: Vital signs are normal. She appears well-developed and well-nourished. She is cooperative.  Non-toxic appearance. She does not appear ill. No distress.  obese  HENT:  Head: Normocephalic.  Right Ear: Hearing, tympanic membrane, external ear and ear canal normal. Tympanic membrane is not erythematous, not retracted and not bulging.  Left Ear: Hearing, tympanic membrane, external ear and ear canal normal. Tympanic membrane is not erythematous, not retracted  and not bulging.  Nose: No mucosal edema or rhinorrhea. Right sinus exhibits no maxillary sinus tenderness and no frontal sinus tenderness. Left sinus exhibits no maxillary sinus tenderness and no frontal sinus tenderness.  Mouth/Throat: Uvula is midline, oropharynx is clear and moist and mucous membranes are normal.  Eyes: Conjunctivae, EOM and lids are normal. Pupils are equal, round, and reactive to light. Lids are everted and swept, no foreign bodies found.  Neck: Trachea normal and normal range of motion. Neck supple. Carotid bruit is not present. No mass and no thyromegaly present.  Cardiovascular: Normal rate, regular rhythm, S1 normal, S2 normal, normal heart sounds, intact distal pulses and normal pulses.  Exam reveals no gallop and no friction rub.   No murmur heard. Pulmonary/Chest: Effort normal and breath sounds normal. Not tachypneic. No respiratory distress. She has no decreased breath sounds. She has no wheezes. She has no rhonchi. She has no rales.  Abdominal: Soft. Normal appearance and bowel sounds are normal. There is no tenderness.  Neurological: She is alert.  Skin: Skin is warm, dry and intact. No rash noted.  Psychiatric: Her speech is normal and behavior is normal. Judgment and thought content normal. Her mood appears not anxious. Cognition and memory are normal. She does not exhibit a depressed mood.     Diabetic foot exam: Normal inspection No skin breakdown No calluses  Normal DP pulses Normal sensation to light touch and monofilament Nails normal  Assessment & Plan:  Total visit time 30 minutes, > 50% spent counseling and cordinating patients care.

## 2013-06-14 NOTE — Telephone Encounter (Signed)
Inject 0.6 mg into skin daily x 1 week, then increase to 1.2 mg daily  therafter

## 2013-06-14 NOTE — Telephone Encounter (Signed)
Hannah Foley left v/m requesting cb for clarification of instructions for Liraglutide.Please advise.

## 2013-06-14 NOTE — Assessment & Plan Note (Signed)
Likely causing significant comorbidity.

## 2013-06-14 NOTE — Telephone Encounter (Signed)
Misty Stanley at [employer] notified as instructed and voiced understanding.

## 2013-06-14 NOTE — Patient Instructions (Addendum)
Have eye MD visit. Send in lab panel from GYN. Restart exercise and work on weight loss. Work on diet changes with nutritionist at work. Follow up in 2 weeks with Dr.  For Dm check. Bring your record... Fasting in morning and 2 hours after one meal a day. Follow up in 3 months with lab prior.

## 2013-06-14 NOTE — Assessment & Plan Note (Signed)
Improving.

## 2013-06-14 NOTE — Assessment & Plan Note (Signed)
Poor control. Start on victoza.  Counseled on lifestyle changes. Info given. Has nutritionist at work. Follow up in 2 weeks.  Recheck A1C in 3 months.

## 2013-06-15 ENCOUNTER — Other Ambulatory Visit: Payer: Self-pay | Admitting: *Deleted

## 2013-06-15 MED ORDER — INSULIN PEN NEEDLE 32G X 6 MM MISC: 1.0000 | Freq: Every day | Status: DC

## 2013-06-28 ENCOUNTER — Ambulatory Visit (INDEPENDENT_AMBULATORY_CARE_PROVIDER_SITE_OTHER): Payer: Commercial Managed Care - PPO | Admitting: Family Medicine

## 2013-06-28 ENCOUNTER — Encounter: Payer: Self-pay | Admitting: Family Medicine

## 2013-06-28 VITALS — BP 104/74 | HR 92 | Temp 98.7°F | Ht 70.0 in | Wt 225.2 lb

## 2013-06-28 DIAGNOSIS — IMO0001 Reserved for inherently not codable concepts without codable children: Secondary | ICD-10-CM

## 2013-06-28 MED ORDER — LIRAGLUTIDE 18 MG/3ML ~~LOC~~ SOPN: 1.8000 mg | PEN_INJECTOR | Freq: Every day | SUBCUTANEOUS | Status: DC

## 2013-06-28 NOTE — Progress Notes (Signed)
  Subjective:    Patient ID: Hannah Foley, female    DOB: 10-27-1980, 32 y.o.   MRN: [id number]  HPI  32 year old female recent dx of DM presents for 2 week follow up after victoza initiation. She has not had any SE to this medication.  After  1 week went to dose of 1.2  FBS 124-134, post prandial 1 hr  159-192. She has noted she is eating less... More full, working on healthy eating.    Lab Results  Component Value Date   HGBA1C 8.7* 10/25/2010   Wt Readings from Last 3 Encounters:  06/28/13 225 lb 4 oz (102.173 kg)  06/14/13 227 lb 12 oz (103.307 kg)  05/30/13 228 lb 12 oz (103.76 kg)      Review of Systems  Constitutional: Negative for fever and fatigue.  HENT: Negative for ear pain.   Eyes: Negative for pain.  Respiratory: Negative for chest tightness and shortness of breath.   Cardiovascular: Negative for chest pain, palpitations and leg swelling.  Gastrointestinal: Negative for abdominal pain.  Genitourinary: Negative for dysuria.       Objective:   Physical Exam  Constitutional: Vital signs are normal. She appears well-developed and well-nourished. She is cooperative.  Non-toxic appearance. She does not appear ill. No distress.  HENT:  Head: Normocephalic.  Right Ear: Hearing, tympanic membrane, external ear and ear canal normal. Tympanic membrane is not erythematous, not retracted and not bulging.  Left Ear: Hearing, tympanic membrane, external ear and ear canal normal. Tympanic membrane is not erythematous, not retracted and not bulging.  Nose: No mucosal edema or rhinorrhea. Right sinus exhibits no maxillary sinus tenderness and no frontal sinus tenderness. Left sinus exhibits no maxillary sinus tenderness and no frontal sinus tenderness.  Mouth/Throat: Uvula is midline, oropharynx is clear and moist and mucous membranes are normal.  Eyes: Conjunctivae, EOM and lids are normal. Pupils are equal, round, and reactive to light. Lids are everted and swept, no foreign  bodies found.  Neck: Trachea normal and normal range of motion. Neck supple. Carotid bruit is not present. No mass and no thyromegaly present.  Cardiovascular: Normal rate, regular rhythm, S1 normal, S2 normal, normal heart sounds, intact distal pulses and normal pulses.  Exam reveals no gallop and no friction rub.   No murmur heard. Pulmonary/Chest: Effort normal and breath sounds normal. Not tachypneic. No respiratory distress. She has no decreased breath sounds. She has no wheezes. She has no rhonchi. She has no rales.  Abdominal: Soft. Normal appearance and bowel sounds are normal. There is no tenderness.  Neurological: She is alert.  Skin: Skin is warm, dry and intact. No rash noted.  Psychiatric: Her speech is normal and behavior is normal. Judgment and thought content normal. Her mood appears not anxious. Cognition and memory are normal. She does not exhibit a depressed mood.          Assessment & Plan:

## 2013-06-28 NOTE — Patient Instructions (Addendum)
Increase victoza to the maximum dose 1.8 mg.  Goal fasting  blood sugar < 120, ideally less than 100. 2 hours after meals goal < 180. Low blood sugar in < 60... If occurs have 4 oz orange juice. Follow up in 2 and 1/2 months for appt with fasting labs prior.

## 2013-06-28 NOTE — Assessment & Plan Note (Signed)
Improving control on victoza, tolerating well. Increase dose to max, conitnue working on lifestyle and follow up in 2.5 months for A1C.

## 2013-06-30 ENCOUNTER — Other Ambulatory Visit: Payer: Self-pay

## 2013-08-03 ENCOUNTER — Telehealth: Payer: Self-pay

## 2013-08-03 NOTE — Telephone Encounter (Signed)
Pt left v/m; pt taking Victoza; pt has problem with indigestion at night, reflux burns her throat; Pepcid is not helping.[facility] Ditto; pt request cb.

## 2013-08-03 NOTE — Telephone Encounter (Signed)
Have her try 2 tabs pf omeprazole OTC  Daily... If no relief, decrease victoza back to 1.2 mgs daily.

## 2013-08-04 NOTE — Telephone Encounter (Signed)
Patient notified as instructed by telephone. 

## 2013-09-06 ENCOUNTER — Encounter: Payer: Self-pay | Admitting: Family Medicine

## 2013-09-06 ENCOUNTER — Ambulatory Visit (INDEPENDENT_AMBULATORY_CARE_PROVIDER_SITE_OTHER): Payer: Commercial Managed Care - PPO | Admitting: Family Medicine

## 2013-09-06 VITALS — BP 90/70 | HR 92 | Temp 98.0°F | Ht 70.0 in | Wt 209.8 lb

## 2013-09-06 DIAGNOSIS — IMO0001 Reserved for inherently not codable concepts without codable children: Secondary | ICD-10-CM

## 2013-09-06 DIAGNOSIS — E7800 Pure hypercholesterolemia, unspecified: Secondary | ICD-10-CM

## 2013-09-06 DIAGNOSIS — E1165 Type 2 diabetes mellitus with hyperglycemia: Principal | ICD-10-CM

## 2013-09-06 LAB — LIPID PANEL
Cholesterol: 166 mg/dL (ref 0–200)
HDL: 31.9 mg/dL — ABNORMAL LOW (ref >39.00)
LDL Cholesterol: 114 mg/dL — ABNORMAL HIGH (ref 0–99)
Total CHOL/HDL Ratio: 5
Triglycerides: 102 mg/dL (ref 0.0–149.0)
VLDL: 20.4 mg/dL (ref 0.0–40.0)

## 2013-09-06 LAB — COMPREHENSIVE METABOLIC PANEL
ALT: 21 U/L (ref 0–35)
AST: 18 U/L (ref 0–37)
Albumin: 3.9 g/dL (ref 3.5–5.2)
Alkaline Phosphatase: 53 U/L (ref 39–117)
BUN: 10 mg/dL (ref 6–23)
CO2: 25 mEq/L (ref 19–32)
Calcium: 9.1 mg/dL (ref 8.4–10.5)
Chloride: 106 mEq/L (ref 96–112)
Creatinine, Ser: 1 mg/dL (ref 0.4–1.2)
GFR: 65.03 mL/min (ref >60.00)
Glucose, Bld: 98 mg/dL (ref 70–99)
Potassium: 4.5 mEq/L (ref 3.5–5.1)
Sodium: 137 mEq/L (ref 135–145)
Total Bilirubin: 0.5 mg/dL (ref 0.3–1.2)
Total Protein: 7.5 g/dL (ref 6.0–8.3)

## 2013-09-06 LAB — HEMOGLOBIN A1C: Hgb A1c MFr Bld: 6.1 % (ref 4.6–6.5)

## 2013-09-06 NOTE — Assessment & Plan Note (Signed)
Due for re-eval. 

## 2013-09-06 NOTE — Progress Notes (Signed)
33 year old female recent dx of DM presents for  follow up   Diabetes:  On victoza 1.8 She is down two sizes of pants. She has not started exercsing. Using medications without difficulties: occ flare ups of indigestion at times, better if does not lay down after eating. Hypoglycemic episodes:None Hyperglycemic episodes:None Feet problems:None Blood Sugars averaging: FBS 90 eye exam within last year:due She has not had any SE to this medication.   Due for re-eval of A1C.  Wt Readings from Last 3 Encounters:  09/06/13 209 lb 12 oz (95.142 kg)  06/28/13 225 lb 4 oz (102.173 kg)  06/14/13 227 lb 12 oz (103.307 kg)      Review of Systems  Constitutional: Negative for fever and fatigue.  HENT: Negative for ear pain.  Eyes: Negative for pain.  Respiratory: Negative for chest tightness and shortness of breath.  Cardiovascular: Negative for chest pain, palpitations and leg swelling.  Gastrointestinal: Negative for abdominal pain.  Genitourinary: Negative for dysuria.  Objective:   Physical Exam  Constitutional: Vital signs are normal. She appears well-developed and well-nourished. She is cooperative. Non-toxic appearance. She does not appear ill. No distress.  HENT:  Head: Normocephalic.  Right Ear: Hearing, tympanic membrane, external ear and ear canal normal. Tympanic membrane is not erythematous, not retracted and not bulging.  Left Ear: Hearing, tympanic membrane, external ear and ear canal normal. Tympanic membrane is not erythematous, not retracted and not bulging.  Nose: No mucosal edema or rhinorrhea. Right sinus exhibits no maxillary sinus tenderness and no frontal sinus tenderness. Left sinus exhibits no maxillary sinus tenderness and no frontal sinus tenderness.  Mouth/Throat: Uvula is midline, oropharynx is clear and moist and mucous membranes are normal.  Eyes: Conjunctivae, EOM and lids are normal. Pupils are equal, round, and reactive to light. Lids are everted and swept,  no foreign bodies found.  Neck: Trachea normal and normal range of motion. Neck supple. Carotid bruit is not present. No mass and no thyromegaly present.  Cardiovascular: Normal rate, regular rhythm, S1 normal, S2 normal, normal heart sounds, intact distal pulses and normal pulses. Exam reveals no gallop and no friction rub.  No murmur heard.  Pulmonary/Chest: Effort normal and breath sounds normal. Not tachypneic. No respiratory distress. She has no decreased breath sounds. She has no wheezes. She has no rhonchi. She has no rales.  Abdominal: Soft. Normal appearance and bowel sounds are normal. There is no tenderness.  Neurological: She is alert.  Skin: Skin is warm, dry and intact. No rash noted.  Psychiatric: Her speech is normal and behavior is normal. Judgment and thought content normal. Her mood appears not anxious. Cognition and memory are normal. She does not exhibit a depressed mood  Diabetic foot exam: Normal inspection No skin breakdown No calluses  Normal DP pulses Normal sensation to light touch and monofilament Nails normal

## 2013-09-06 NOTE — Assessment & Plan Note (Signed)
Due for re-eval of A1C but likely well controlled on victoza with weight loss.

## 2013-09-06 NOTE — Progress Notes (Signed)
Pre-visit discussion using our clinic review tool. No additional management support is needed unless otherwise documented below in the visit note.  

## 2013-09-06 NOTE — Patient Instructions (Addendum)
Stop at lab on way out. Add exercise to routine. Continuing working with weight loss. Schedule DM follow up in 6 months with fasting labs prior.

## 2013-09-07 ENCOUNTER — Telehealth: Payer: Self-pay

## 2013-09-07 ENCOUNTER — Telehealth: Payer: Self-pay | Admitting: Family Medicine

## 2013-09-07 NOTE — Telephone Encounter (Signed)
Relevant patient education assigned to patient using Emmi. ° °

## 2013-10-15 ENCOUNTER — Ambulatory Visit: Payer: Self-pay | Admitting: Physician Assistant

## 2013-10-15 LAB — PREGNANCY, URINE: Pregnancy Test, Urine: NEGATIVE m[IU]/mL

## 2013-11-22 ENCOUNTER — Telehealth: Payer: Self-pay | Admitting: Family Medicine

## 2013-11-22 ENCOUNTER — Ambulatory Visit (INDEPENDENT_AMBULATORY_CARE_PROVIDER_SITE_OTHER): Payer: Commercial Managed Care - PPO | Admitting: Family Medicine

## 2013-11-22 ENCOUNTER — Encounter: Payer: Self-pay | Admitting: Family Medicine

## 2013-11-22 VITALS — BP 100/60 | HR 79 | Temp 98.2°F | Ht 70.0 in | Wt 197.5 lb

## 2013-11-22 DIAGNOSIS — IMO0001 Reserved for inherently not codable concepts without codable children: Secondary | ICD-10-CM

## 2013-11-22 DIAGNOSIS — Z3200 Encounter for pregnancy test, result unknown: Secondary | ICD-10-CM

## 2013-11-22 DIAGNOSIS — E1165 Type 2 diabetes mellitus with hyperglycemia: Principal | ICD-10-CM

## 2013-11-22 LAB — HCG, SERUM, QUALITATIVE: Preg, Serum: NEGATIVE

## 2013-11-22 NOTE — Progress Notes (Signed)
33 year old female recent dx of DM presents for follow up.  Pt of Dr. Elmer SowAron's   She has not been taking OCps regularly and is concerned she may be pregnant . She had negative tests at home and is interested in blood serum preg.  She has been sick on stomach, occ vaginal discharge. She had sex during time she was sexually active. No breast tenderness. LMP  2 weeks ago, but had a second period.  Diabetes: On victoza 1.8  Lab Results  Component Value Date   HGBA1C 6.1 09/06/2013  Using medications without difficulties:  Cost of victoza. is now prohibhtive Hypoglycemic episodes:occ if skipped bedtime snack Hyperglycemic episodes:None  Feet problems:None  Blood Sugars averaging: FBS  88 eye exam within last year:due  She has not had any SE to this medication.   Wt Readings from Last 3 Encounters:  11/22/13 197 lb 8 oz (89.585 kg)  09/06/13 209 lb 12 oz (95.142 kg)  06/28/13 225 lb 4 oz (102.173 kg)    Review of Systems  Constitutional: Negative for fever and fatigue.  HENT: Negative for ear pain.  Eyes: Negative for pain.  Respiratory: Negative for chest tightness and shortness of breath.  Cardiovascular: Negative for chest pain, palpitations and leg swelling.  Gastrointestinal: Negative for abdominal pain.  Genitourinary: Negative for dysuria.  Objective:   Physical Exam  Constitutional: Vital signs are normal. She appears well-developed and well-nourished. She is cooperative. Non-toxic appearance. She does not appear ill. No distress.  HENT:  Head: Normocephalic.  Right Ear: Hearing, tympanic membrane, external ear and ear canal normal. Tympanic membrane is not erythematous, not retracted and not bulging.  Left Ear: Hearing, tympanic membrane, external ear and ear canal normal. Tympanic membrane is not erythematous, not retracted and not bulging.  Nose: No mucosal edema or rhinorrhea. Right sinus exhibits no maxillary sinus tenderness and no frontal sinus tenderness. Left sinus  exhibits no maxillary sinus tenderness and no frontal sinus tenderness.  Mouth/Throat: Uvula is midline, oropharynx is clear and moist and mucous membranes are normal.  Eyes: Conjunctivae, EOM and lids are normal. Pupils are equal, round, and reactive to light. Lids are everted and swept, no foreign bodies found.  Neck: Trachea normal and normal range of motion. Neck supple. Carotid bruit is not present. No mass and no thyromegaly present.  Cardiovascular: Normal rate, regular rhythm, S1 normal, S2 normal, normal heart sounds, intact distal pulses and normal pulses. Exam reveals no gallop and no friction rub.  No murmur heard.  Pulmonary/Chest: Effort normal and breath sounds normal. Not tachypneic. No respiratory distress. She has no decreased breath sounds. She has no wheezes. She has no rhonchi. She has no rales.  Abdominal: Soft. Normal appearance and bowel sounds are normal. There is no tenderness.  Neurological: She is alert.  Skin: Skin is warm, dry and intact. No rash noted.  Psychiatric: Her speech is normal and behavior is normal. Judgment and thought content normal. Her mood appears not anxious. Cognition and memory are normal. She does not exhibit a depressed mood  Diabetic foot exam:  Normal inspection  No skin breakdown  No calluses  Normal DP pulses  Normal sensation to light touch and monofilament  Nails normal

## 2013-11-22 NOTE — Telephone Encounter (Signed)
Relevant patient education mailed to patient.  

## 2013-11-22 NOTE — Progress Notes (Signed)
Pre visit review using our clinic review tool, if applicable. No additional management support is needed unless otherwise documented below in the visit note. 

## 2013-11-22 NOTE — Assessment & Plan Note (Signed)
Excellent control last check. Continued weight loss.  Cannot afford victoza so we will D/C.  Encouraged exercise, weight loss, healthy eating habits.  Follow up in 3 months.

## 2013-11-22 NOTE — Patient Instructions (Addendum)
Stop victoza. Work on [employer]healthy eating, start regular exercise. Follow up in 3 months with fasting labs prior. Stop at lab on way out. CHANGE PCP in computer to Dr. Ermalene SearingBedsole.

## 2013-12-02 ENCOUNTER — Telehealth: Payer: Self-pay

## 2013-12-02 NOTE — Telephone Encounter (Signed)
Relevant patient education assigned to patient using Emmi. ° °

## 2013-12-19 ENCOUNTER — Encounter: Payer: Self-pay | Admitting: Family Medicine

## 2013-12-19 ENCOUNTER — Ambulatory Visit (INDEPENDENT_AMBULATORY_CARE_PROVIDER_SITE_OTHER): Payer: Commercial Managed Care - PPO | Admitting: Family Medicine

## 2013-12-19 ENCOUNTER — Telehealth: Payer: Self-pay | Admitting: Family Medicine

## 2013-12-19 VITALS — BP 122/62 | HR 84 | Temp 97.4°F | Ht 69.5 in | Wt 189.2 lb

## 2013-12-19 DIAGNOSIS — Z113 Encounter for screening for infections with a predominantly sexual mode of transmission: Secondary | ICD-10-CM

## 2013-12-19 LAB — HIV ANTIBODY (ROUTINE TESTING W REFLEX): HIV 1&2 Ab, 4th Generation: NONREACTIVE

## 2013-12-19 LAB — RPR

## 2013-12-19 NOTE — Telephone Encounter (Signed)
Relevant patient education assigned to patient using Emmi. ° °

## 2013-12-19 NOTE — Patient Instructions (Signed)
Good to see you. We will call you with your lab results.   

## 2013-12-19 NOTE — Addendum Note (Signed)
Addended by: Baldomero LamyHAVERS, Nhu Glasby C on: 12/19/2013 12:28 PM   Modules accepted: Orders

## 2013-12-19 NOTE — Assessment & Plan Note (Signed)
Orders Placed This Encounter  Procedures  . GC/Chlamydia Probe Amp  . HIV Antibody  . RPR  . HSV(herpes simplex vrs) 1+2 ab-IgM

## 2013-12-19 NOTE — Progress Notes (Signed)
Pre visit review using our clinic review tool, if applicable. No additional management support is needed unless otherwise documented below in the visit note. 

## 2013-12-19 NOTE — Progress Notes (Signed)
   Subjective:   Patient ID: Hannah Foley, female    DOB: 07/06/1981, 33 y.o.   MRN: [id number]  Hannah Hampshiremily C Kam is a pleasant 33 y.o. year old female who presents to clinic today with infectious disease  on 12/19/2013  HPI: In a new relationship (started a few months ago). Very happy and trusts him very much.   They do not use condoms.  On OCPs.  He was complaining of burning with urination and was tested for STDs at walk in clinic- results not back yet.  She wants to be on safe side and get checked out. Denies any dysuria, vaginal discharge or genital lesions.  Patient Active Problem List   Diagnosis Date Noted  . High cholesterol 09/06/2013  . Morbid obesity 06/14/2013  . Smoker 05/30/2013  . Bartholin's gland abscess 03/18/2013  . Screen for STD (sexually transmitted disease) 02/22/2013  . Endometriosis 07/24/2011  . Low back pain 03/12/2011  . Type II or unspecified type diabetes mellitus without mention of complication, not stated as uncontrolled 10/25/2010   Past Medical History  Diagnosis Date  . Diabetes mellitus     type 2   Past Surgical History  Procedure Laterality Date  . Cholecystectomy    . Vaginal hysterectomy      laproscopy, hyst for endometriosis   History  Substance Use Topics  . Smoking status: Current Every Day Smoker -- 1.00 packs/day    Types: Cigarettes  . Smokeless tobacco: Never Used  . Alcohol Use: Yes   Family History  Problem Relation Age of Onset  . Cancer Paternal Aunt 7850    breast  . Cancer Other     breast   No Known Allergies Current Outpatient Prescriptions on File Prior to Visit  Medication Sig Dispense Refill  . Norethin Ace-Eth Estrad-FE (MICROGESTIN FE 1.5/30 PO) Take 1 tablet by mouth daily.       No current facility-administered medications on file prior to visit.   The PMH, PSH, Social History, Family History, Medications, and allergies [facility] been [facility], and have been updated if relevant.    Review of  Systems    See HPI Objective:    BP 122/62  Pulse 84  Temp(Src) 97.4 F (36.3 C) (Oral)  Ht 5' 9.5" (1.765 m)  Wt 189 lb 4 oz (85.843 kg)  BMI 27.56 kg/m2  SpO2 98%  LMP 12/06/2013  Wt Readings from Last 3 Encounters:  12/19/13 189 lb 4 oz (85.843 kg)  11/22/13 197 lb 8 oz (89.585 kg)  09/06/13 209 lb 12 oz (95.142 kg)    Physical Exam  Gen:  Alert, pleasant, NAD GU: Normal vagina and external genitalia. No CMT       Assessment & Plan:   Screen for STD (sexually transmitted disease) No Follow-up on file.

## 2013-12-20 LAB — GC/CHLAMYDIA PROBE AMP
CT Probe RNA: NEGATIVE
GC Probe RNA: NEGATIVE

## 2013-12-20 LAB — HSV(HERPES SIMPLEX VRS) I + II AB-IGM: Herpes Simplex Vrs I&II-IgM Ab (EIA): 2.54 INDEX — ABNORMAL HIGH

## 2014-02-15 ENCOUNTER — Other Ambulatory Visit: Payer: Commercial Managed Care - PPO

## 2014-02-16 ENCOUNTER — Other Ambulatory Visit (INDEPENDENT_AMBULATORY_CARE_PROVIDER_SITE_OTHER): Payer: Commercial Managed Care - PPO

## 2014-02-16 ENCOUNTER — Other Ambulatory Visit: Payer: Self-pay | Admitting: Family Medicine

## 2014-02-16 DIAGNOSIS — E119 Type 2 diabetes mellitus without complications: Secondary | ICD-10-CM

## 2014-02-16 LAB — COMPREHENSIVE METABOLIC PANEL
ALT: 16 U/L (ref 0–35)
AST: 20 U/L (ref 0–37)
Albumin: 3.9 g/dL (ref 3.5–5.2)
Alkaline Phosphatase: 50 U/L (ref 39–117)
BUN: 11 mg/dL (ref 6–23)
CO2: 24 mEq/L (ref 19–32)
Calcium: 9.1 mg/dL (ref 8.4–10.5)
Chloride: 105 mEq/L (ref 96–112)
Creatinine, Ser: 1 mg/dL (ref 0.4–1.2)
GFR: 67.89 mL/min (ref >60.00)
Glucose, Bld: 130 mg/dL — ABNORMAL HIGH (ref 70–99)
Potassium: 4.1 mEq/L (ref 3.5–5.1)
Sodium: 137 mEq/L (ref 135–145)
Total Bilirubin: 0.4 mg/dL (ref 0.2–1.2)
Total Protein: 7.5 g/dL (ref 6.0–8.3)

## 2014-02-16 LAB — HEMOGLOBIN A1C: Hgb A1c MFr Bld: 6.1 % (ref 4.6–6.5)

## 2014-02-21 ENCOUNTER — Ambulatory Visit (INDEPENDENT_AMBULATORY_CARE_PROVIDER_SITE_OTHER): Payer: Commercial Managed Care - PPO | Admitting: Family Medicine

## 2014-02-21 ENCOUNTER — Encounter: Payer: Self-pay | Admitting: Family Medicine

## 2014-02-21 VITALS — BP 120/74 | HR 81 | Temp 97.8°F | Ht 69.5 in | Wt 181.5 lb

## 2014-02-21 DIAGNOSIS — E119 Type 2 diabetes mellitus without complications: Secondary | ICD-10-CM

## 2014-02-21 NOTE — Assessment & Plan Note (Signed)
>  15 minutes spent in face to face time with patient, >50% spent in counselling or coordination of care Discussed lab results with pt. Congratulated on her on continued success- diabetes is now diet controlled. Follow up in 1 year unless her FSBS increase- she will continue to monitor at home.

## 2014-02-21 NOTE — Progress Notes (Signed)
   Subjective:   Patient ID: Hannah Foley, female    DOB: 06/19/1981, 33 y.o.   MRN: [id number]  Hannah Foley is a pleasant 33 y.o. year old female who presents to clinic today with Follow-up  on 02/21/2014  HPI: Has been seeing Dr. Ermalene SearingBedsole for her DM.  Last note reviewed.  Was on Victoza 1.8.  Well controlled and losing weight but cost was prohibitive so it was d/c'd.  Dr. B encouraged life style changes- diet control.  She is doing well, has continued to lose weight.  Lab Results  Component Value Date   HGBA1C 6.1 02/16/2014    Wt Readings from Last 3 Encounters:  02/21/14 181 lb 8 oz (82.328 kg)  12/19/13 189 lb 4 oz (85.843 kg)  11/22/13 197 lb 8 oz (89.585 kg)     Current Outpatient Prescriptions on File Prior to Visit  Medication Sig Dispense Refill  . Norethin Ace-Eth Estrad-FE (MICROGESTIN FE 1.5/30 PO) Take 1 tablet by mouth daily.       No current facility-administered medications on file prior to visit.    No Known Allergies  Past Medical History  Diagnosis Date  . Diabetes mellitus     type 2    Past Surgical History  Procedure Laterality Date  . Cholecystectomy    . Vaginal hysterectomy      laproscopy, hyst for endometriosis    Family History  Problem Relation Age of Onset  . Cancer Paternal Aunt 1650    breast  . Cancer Other     breast    History   Social History  . Marital Status: Single    Spouse Name: N/A    Number of Children: 0  . Years of Education: N/A   Occupational History  . Social Worker, [employer]    Social History Main Topics  . Smoking status: Current Every Day Smoker -- 1.00 packs/day    Types: Cigarettes  . Smokeless tobacco: Never Used  . Alcohol Use: Yes  . Drug Use: No  . Sexual Activity: Not on file   Other Topics Concern  . Not on file   Social History Narrative  . No narrative on file   The PMH, PSH, Social History, Family History, Medications, and allergies have been reviewed [facility], and have  been updated if relevant.    Review of Systems See HPI Denies any CP or SOB No hot sweats, nausea or changes in blood sugar    Objective:    BP 120/74  Pulse 81  Temp(Src) 97.8 F (36.6 C) (Oral)  Ht 5' 9.5" (1.765 m)  Wt 181 lb 8 oz (82.328 kg)  BMI 26.43 kg/m2  SpO2 99%  LMP 02/18/2014   Physical Exam  Gen:  Alert, pleasant, NAD Psych:  Good eye contact, not anxious or depressed appearing     Assessment & Plan:   Type II or unspecified type diabetes mellitus without mention of complication, not stated as uncontrolled No Follow-up on file.

## 2014-02-21 NOTE — Progress Notes (Signed)
Pre visit review using our clinic review tool, if applicable. No additional management support is needed unless otherwise documented below in the visit note. 

## 2014-05-17 ENCOUNTER — Encounter: Payer: Self-pay | Admitting: Internal Medicine

## 2014-05-17 ENCOUNTER — Ambulatory Visit (INDEPENDENT_AMBULATORY_CARE_PROVIDER_SITE_OTHER): Payer: Commercial Managed Care - PPO | Admitting: Internal Medicine

## 2014-05-17 VITALS — BP 120/76 | HR 81 | Temp 98.2°F | Wt 177.0 lb

## 2014-05-17 DIAGNOSIS — J219 Acute bronchiolitis, unspecified: Secondary | ICD-10-CM

## 2014-05-17 DIAGNOSIS — B379 Candidiasis, unspecified: Secondary | ICD-10-CM

## 2014-05-17 DIAGNOSIS — T3695XA Adverse effect of unspecified systemic antibiotic, initial encounter: Secondary | ICD-10-CM

## 2014-05-17 DIAGNOSIS — J218 Acute bronchiolitis due to other specified organisms: Secondary | ICD-10-CM

## 2014-05-17 MED ORDER — FLUCONAZOLE 150 MG PO TABS: 150.0000 mg | ORAL_TABLET | Freq: Once | ORAL | Status: DC

## 2014-05-17 MED ORDER — AZITHROMYCIN 250 MG PO TABS: ORAL_TABLET | ORAL | Status: DC

## 2014-05-17 NOTE — Patient Instructions (Addendum)

## 2014-05-17 NOTE — Progress Notes (Signed)
HPI  Pt presents to the clinic today with c/o cough, chest congestion, shortness of breath and fatigue. She reports that this started 2 weeks ago. The cough is productive of yellow mucous. She denies fever but has had chills and body aches. She has tried Catering manager without relief. She is a smoker. She has had sick contacts.  Review of Systems      Past Medical History  Diagnosis Date  . Diabetes mellitus     type 2    Family History  Problem Relation Age of Onset  . Cancer Paternal Aunt 34    breast  . Cancer Other     breast    History   Social History  . Marital Status: Single    Spouse Name: N/A    Number of Children: 0  . Years of Education: N/A   Occupational History  . Social Worker, [employer]    Social History Main Topics  . Smoking status: Current Every Day Smoker -- 1.00 packs/day    Types: Cigarettes  . Smokeless tobacco: Never Used  . Alcohol Use: Yes  . Drug Use: No  . Sexual Activity: Not on file   Other Topics Concern  . Not on file   Social History Narrative  . No narrative on file    No Known Allergies   Constitutional: Positive headache, fatigue. Denies fever or abrupt weight changes.  HEENT:  Positive sore throat. Denies eye redness, eye pain, pressure behind the eyes, facial pain, nasal congestion, ear pain, ringing in the ears, wax buildup, runny nose or bloody nose. Respiratory: Positive cough. Denies difficulty breathing or shortness of breath.  Cardiovascular: Denies chest pain, chest tightness, palpitations or swelling in the hands or feet.   No other specific complaints in a complete review of systems (except as listed in HPI above).  Objective:   BP 120/76  Pulse 81  Temp(Src) 98.2 F (36.8 C) (Oral)  Wt 177 lb (80.287 kg)  SpO2 98% Wt Readings from Last 3 Encounters:  05/17/14 177 lb (80.287 kg)  02/21/14 181 lb 8 oz (82.328 kg)  12/19/13 189 lb 4 oz (85.843 kg)     General: Appears her stated age, well  developed, well nourished in NAD. HEENT:  Ears: Tm's gray and intact, normal light reflex; Nose: mucosa pink and moist, septum midline; Throat/Mouth: + PND. Teeth present, mucosa erythematous and moist, no exudate noted, no lesions or ulcerations noted.  Neck: Mild cervical lymphadenopathy. Cardiovascular: Normal rate and rhythm. S1,S2 noted.  No murmur, rubs or gallops noted.  Pulmonary/Chest: Normal effort and intermittent expiratory wheezing noted. No respiratory distress. No rales or ronchi noted.      Assessment & Plan:   Acute bronchitis:  Stop Smoking!! Get some rest and drink plenty of water Do salt water gargles for the sore throat eRx for Azithromax x 5 days eRx for diflucan for antibiotic induced yeast infection  RTC as needed or if symptoms persist.

## 2014-05-17 NOTE — Progress Notes (Signed)
Pre visit review using our clinic review tool, if applicable. No additional management support is needed unless otherwise documented below in the visit note. 

## 2014-05-18 ENCOUNTER — Ambulatory Visit: Payer: Commercial Managed Care - PPO | Admitting: Internal Medicine

## 2014-08-03 ENCOUNTER — Encounter: Payer: Self-pay | Admitting: Family Medicine

## 2014-08-03 ENCOUNTER — Ambulatory Visit (INDEPENDENT_AMBULATORY_CARE_PROVIDER_SITE_OTHER): Payer: Commercial Managed Care - PPO | Admitting: Family Medicine

## 2014-08-03 ENCOUNTER — Other Ambulatory Visit (HOSPITAL_COMMUNITY)
Admission: RE | Admit: 2014-08-03 | Discharge: 2014-08-03 | Disposition: A | Discharge status: Home / Self Care | Payer: Commercial Managed Care - PPO | Source: Ambulatory Visit | Attending: Family Medicine | Admitting: Family Medicine

## 2014-08-03 VITALS — BP 126/74 | HR 66 | Temp 98.0°F | Ht 69.5 in | Wt 181.5 lb

## 2014-08-03 DIAGNOSIS — Z01419 Encounter for gynecological examination (general) (routine) without abnormal findings: Secondary | ICD-10-CM | POA: Diagnosis present

## 2014-08-03 DIAGNOSIS — Z1151 Encounter for screening for human papillomavirus (HPV): Secondary | ICD-10-CM | POA: Diagnosis present

## 2014-08-03 DIAGNOSIS — F17200 Nicotine dependence, unspecified, uncomplicated: Secondary | ICD-10-CM

## 2014-08-03 DIAGNOSIS — Z0000 Encounter for general adult medical examination without abnormal findings: Secondary | ICD-10-CM | POA: Insufficient documentation

## 2014-08-03 DIAGNOSIS — E785 Hyperlipidemia, unspecified: Secondary | ICD-10-CM

## 2014-08-03 DIAGNOSIS — Z720 Tobacco use: Secondary | ICD-10-CM

## 2014-08-03 DIAGNOSIS — E119 Type 2 diabetes mellitus without complications: Secondary | ICD-10-CM

## 2014-08-03 LAB — CBC WITH DIFFERENTIAL/PLATELET
Basophils Absolute: 0.1 10*3/uL (ref 0.0–0.1)
Basophils Relative: 0.6 % (ref 0.0–3.0)
Eosinophils Absolute: 0.1 10*3/uL (ref 0.0–0.7)
Eosinophils Relative: 1 % (ref 0.0–5.0)
HCT: 43.5 % (ref 36.0–46.0)
Hemoglobin: 13.9 g/dL (ref 12.0–15.0)
Lymphocytes Relative: 19.1 % (ref 12.0–46.0)
Lymphs Abs: 2.1 10*3/uL (ref 0.7–4.0)
MCHC: 32.1 g/dL (ref 30.0–36.0)
MCV: 87.1 fl (ref 78.0–100.0)
Monocytes Absolute: 0.4 10*3/uL (ref 0.1–1.0)
Monocytes Relative: 4 % (ref 3.0–12.0)
Neutro Abs: 8.3 10*3/uL — ABNORMAL HIGH (ref 1.4–7.7)
Neutrophils Relative %: 75.3 % (ref 43.0–77.0)
Platelets: 278 10*3/uL (ref 150.0–400.0)
RBC: 4.99 Mil/uL (ref 3.87–5.11)
RDW: 14.3 % (ref 11.5–15.5)
WBC: 11 10*3/uL — ABNORMAL HIGH (ref 4.0–10.5)

## 2014-08-03 LAB — COMPREHENSIVE METABOLIC PANEL
ALT: 11 U/L (ref 0–35)
AST: 12 U/L (ref 0–37)
Albumin: 4 g/dL (ref 3.5–5.2)
Alkaline Phosphatase: 40 U/L (ref 39–117)
BUN: 14 mg/dL (ref 6–23)
CO2: 25 mEq/L (ref 19–32)
Calcium: 8.9 mg/dL (ref 8.4–10.5)
Chloride: 103 mEq/L (ref 96–112)
Creatinine, Ser: 0.9 mg/dL (ref 0.4–1.2)
GFR: 77.57 mL/min (ref >60.00)
Glucose, Bld: 95 mg/dL (ref 70–99)
Potassium: 4.2 mEq/L (ref 3.5–5.1)
Sodium: 135 mEq/L (ref 135–145)
Total Bilirubin: 0.4 mg/dL (ref 0.2–1.2)
Total Protein: 7.4 g/dL (ref 6.0–8.3)

## 2014-08-03 LAB — LIPID PANEL
Cholesterol: 175 mg/dL (ref 0–200)
HDL: 39.4 mg/dL (ref >39.00)
LDL Cholesterol: 119 mg/dL — ABNORMAL HIGH (ref 0–99)
NonHDL: 135.6
Total CHOL/HDL Ratio: 4
Triglycerides: 83 mg/dL (ref 0.0–149.0)
VLDL: 16.6 mg/dL (ref 0.0–40.0)

## 2014-08-03 LAB — TSH: TSH: 1.6 u[IU]/mL (ref 0.35–4.50)

## 2014-08-03 LAB — HEMOGLOBIN A1C: Hgb A1c MFr Bld: 5.9 % (ref 4.6–6.5)

## 2014-08-03 MED ORDER — NORETHIN ACE-ETH ESTRAD-FE 1.5-30 MG-MCG PO TABS: 1.0000 | ORAL_TABLET | Freq: Every day | ORAL | Status: DC

## 2014-08-03 NOTE — Patient Instructions (Signed)
Happy Holidays! We will call you with your results from today.

## 2014-08-03 NOTE — Assessment & Plan Note (Signed)
Has been diet controlled. Recheck a1c today to confirm stability. Lipid panel as well. The patient indicates understanding of these issues and agrees with the plan.

## 2014-08-03 NOTE — Assessment & Plan Note (Signed)
Improved. BMI now 26.4- encouraged her to continue working on diet and exercise.

## 2014-08-03 NOTE — Assessment & Plan Note (Signed)
Quit smoking cigarettes but is vaping now.  Discussed the risks of vaping with her, including popcorn lung, unknown long term effects of inhaling additives/flavoring, etc.

## 2014-08-03 NOTE — Addendum Note (Signed)
Addended by: Liane ComberHAVERS, Sharetta Ricchio C on: 08/03/2014 10:17 AM   Modules accepted: Orders, SmartSet

## 2014-08-03 NOTE — Progress Notes (Signed)
Subjective:   Patient ID: Hannah Foley, female    DOB: 04/13/1981, 33 y.o.   MRN: [id number]  Hannah Foley is a pleasant 33 y.o. year old female who presents to clinic today with Annual Exam  on 08/03/2014  HPI:  Received her flu vaccine at work.  DM- has been diet controlled.    Was on Victoza 1.8- not currently taking anything.    She is doing well, has continued to work on her diet and lifestyle- weight stable.    Lab Results  Component Value Date   HGBA1C 6.1 02/16/2014   Tobacco use- quit smoking cigarettes yesterday, now vaping.  Lab Results  Component Value Date   CHOL 166 09/06/2013   HDL 31.90* 09/06/2013   LDLCALC 114* 09/06/2013   TRIG 102.0 09/06/2013   CHOLHDL 5 09/06/2013   Happier than she has ever been- still with same boyfriend who she is living with.  They did not get officially married but did say their vows to each other in 10/2013.  Very healthy relationship compared to her previous relationship.  Recently tested for STDs.  Wt Readings from Last 3 Encounters:  08/03/14 181 lb 8 oz (82.328 kg)  05/17/14 177 lb (80.287 kg)  02/21/14 181 lb 8 oz (82.328 kg)     No current outpatient prescriptions on file prior to visit.   No current facility-administered medications on file prior to visit.    No Known Allergies  Past Medical History  Diagnosis Date  . Diabetes mellitus     type 2    Past Surgical History  Procedure Laterality Date  . Cholecystectomy    . Vaginal hysterectomy      laproscopy, hyst for endometriosis    Family History  Problem Relation Age of Onset  . Cancer Paternal Aunt 250    breast  . Cancer Other     breast    History   Social History  . Marital Status: Single    Spouse Name: N/A    Number of Children: 0  . Years of Education: N/A   Occupational History  . Social Worker, [employer]    Social History Main Topics  . Smoking status: Current Every Day Smoker -- 1.00 packs/day    Types: Cigarettes   . Smokeless tobacco: Never Used  . Alcohol Use: Yes  . Drug Use: No  . Sexual Activity: Not on file   Other Topics Concern  . Not on file   Social History Narrative   The PMH, PSH, Social History, Family History, Medications, and allergies have been reviewed [facility], and have been updated if relevant.    Review of Systems  See HPI Patient reports no  vision/ hearing changes,anorexia, weight change, fever ,adenopathy, persistant / recurrent hoarseness, swallowing issues, chest pain, edema,persistant / recurrent cough, hemoptysis, dyspnea(rest, exertional, paroxysmal nocturnal), gastrointestinal  bleeding (melena, rectal bleeding), abdominal pain, excessive heart burn, GU symptoms(dysuria, hematuria, pyuria, voiding/incontinence  Issues) syncope, focal weakness, severe memory loss, concerning skin lesions, depression, anxiety, abnormal bruising/bleeding, major joint swelling, breast masses or abnormal vaginal bleeding.         Objective:    BP 126/74 mmHg  Pulse 66  Temp(Src) 98 F (36.7 C) (Oral)  Ht 5' 9.5" (1.765 m)  Wt 181 lb 8 oz (82.328 kg)  BMI 26.43 kg/m2  SpO2 98%  LMP 07/18/2014 (Within Days)  Wt Readings from Last 3 Encounters:  08/03/14 181 lb 8 oz (82.328 kg)  05/17/14  177 lb (80.287 kg)  02/21/14 181 lb 8 oz (82.328 kg)    Physical Exam   General:  Well-developed,well-nourished,in no acute distress; alert,appropriate and cooperative throughout examination Head:  normocephalic and atraumatic.   Eyes:  vision grossly intact, pupils equal, pupils round, and pupils reactive to light.   Ears:  R ear normal and L ear normal.   Nose:  no external deformity.   Mouth:  good dentition.   Neck:  No deformities, masses, or tenderness noted. Breasts:  No mass, nodules, thickening, tenderness, bulging, retraction, inflamation, nipple discharge or skin changes noted.   Lungs:  Normal respiratory effort, chest expands symmetrically. Lungs are clear to auscultation, no  crackles or wheezes. Heart:  Normal rate and regular rhythm. S1 and S2 normal without gallop, murmur, click, rub or other extra sounds. Abdomen:  Bowel sounds positive,abdomen soft and non-tender without masses, organomegaly or hernias noted. Rectal:  no external abnormalities.   Genitalia:  Pelvic Exam:        External: normal female genitalia without lesions or masses        Vagina: normal without lesions or masses        Cervix: normal without lesions or masses        Adnexa: normal bimanual exam without masses or fullness        Uterus: normal by palpation        Pap smear: performed Msk:  No deformity or scoliosis noted of thoracic or lumbar spine.   Extremities:  No clubbing, cyanosis, edema, or deformity noted with normal full range of motion of all joints.   Neurologic:  alert & oriented X3 and gait normal.   Skin:  Intact without suspicious lesions or rashes Cervical Nodes:  No lymphadenopathy noted Axillary Nodes:  No palpable lymphadenopathy Psych:  Cognition and judgment appear intact. Alert and cooperative with normal attention span and concentration. No apparent delusions, illusions, hallucinations      Assessment & Plan:   Diabetes type 2, controlled  Well woman exam with routine gynecological exam No Follow-up on file.

## 2014-08-03 NOTE — Assessment & Plan Note (Signed)
Reviewed preventive care protocols, scheduled due services, and updated immunizations Discussed nutrition, exercise, diet, and healthy lifestyle.  

## 2014-08-04 LAB — CYTOLOGY - PAP

## 2014-08-05 ENCOUNTER — Other Ambulatory Visit: Payer: Self-pay | Admitting: Internal Medicine

## 2014-08-07 ENCOUNTER — Encounter: Payer: Self-pay | Admitting: *Deleted

## 2014-08-23 ENCOUNTER — Ambulatory Visit (INDEPENDENT_AMBULATORY_CARE_PROVIDER_SITE_OTHER): Payer: Commercial Managed Care - PPO | Admitting: Internal Medicine

## 2014-08-23 ENCOUNTER — Encounter: Payer: Self-pay | Admitting: Internal Medicine

## 2014-08-23 VITALS — BP 118/74 | HR 76 | Temp 97.8°F | Wt 189.0 lb

## 2014-08-23 DIAGNOSIS — J0100 Acute maxillary sinusitis, unspecified: Secondary | ICD-10-CM

## 2014-08-23 DIAGNOSIS — B379 Candidiasis, unspecified: Secondary | ICD-10-CM

## 2014-08-23 DIAGNOSIS — T3695XA Adverse effect of unspecified systemic antibiotic, initial encounter: Secondary | ICD-10-CM

## 2014-08-23 MED ORDER — FLUCONAZOLE 150 MG PO TABS: 150.0000 mg | ORAL_TABLET | Freq: Once | ORAL | Status: DC

## 2014-08-23 MED ORDER — AMOXICILLIN-POT CLAVULANATE 875-125 MG PO TABS: 1.0000 | ORAL_TABLET | Freq: Two times a day (BID) | ORAL | Status: DC

## 2014-08-23 NOTE — Patient Instructions (Signed)

## 2014-08-23 NOTE — Progress Notes (Signed)
Pre visit review using our clinic review tool, if applicable. No additional management support is needed unless otherwise documented below in the visit note. 

## 2014-08-23 NOTE — Progress Notes (Signed)
HPI  Pt presents to the clinic today with c/o nasal congestion, cough and chest congestion. She reports this started 3 days ago. She is blowing green mucous out of her nose. She denies fever, chills or body aches. Her cough is unproductive. She denies shortness of breath. She has tried Mucinex and Catering managerAlka Seltzer without much relief. She has no history of allergies or breathing problems but she does smoke. She has had sick contacts.  Review of Systems    Past Medical History  Diagnosis Date  . Diabetes mellitus     type 2    Family History  Problem Relation Age of Onset  . Cancer Paternal Aunt 2050    breast  . Cancer Other     breast    History   Social History  . Marital Status: Single    Spouse Name: N/A    Number of Children: 0  . Years of Education: N/A   Occupational History  . Social Worker, [employer]    Social History Main Topics  . Smoking status: Current Every Day Smoker -- 1.00 packs/day    Types: Cigarettes  . Smokeless tobacco: Never Used  . Alcohol Use: 0.0 oz/week    0 Not specified per week     Comment: occasional  . Drug Use: No  . Sexual Activity: Not on file   Other Topics Concern  . Not on file   Social History Narrative    No Known Allergies   Constitutional: Positive headache, fatigue . Denies fever or abrupt weight changes.  HEENT:  Positive facial pain, nasal congestion and sore throat. Denies eye redness, ear pain, ringing in the ears, wax buildup, runny nose or bloody nose. Respiratory: Positive cough. Denies difficulty breathing or shortness of breath.  Cardiovascular: Denies chest pain, chest tightness, palpitations or swelling in the hands or feet.   No other specific complaints in a complete review of systems (except as listed in HPI above).  Objective:  BP 118/74 mmHg  Pulse 76  Temp(Src) 97.8 F (36.6 C) (Oral)  Wt 189 lb (85.73 kg)  SpO2 98%  LMP 07/18/2014 (Within Days)   General: Appears her stated age, ill  appearing in NAD. HEENT: Head: normal shape and size, maxillary sinus tenderness noted; Eyes: sclera white, no icterus, conjunctiva pink; Ears: Tm's pink but intact, normal light reflex, + serous effusion on the left; Nose: mucosa pink and moist, septum midline; Throat/Mouth: + PND. Teeth present, mucosa erythematous  and moist, no exudate noted, no lesions or ulcerations noted.  Neck: Cervical adenopathy noted. Cardiovascular: Normal rate and rhythm. S1,S2 noted.  No murmur, rubs or gallops noted. Pulmonary/Chest: Normal effort and positive vesicular breath sounds. No respiratory distress. No wheezes, rales or ronchi noted.      Assessment & Plan:   Viral sinusitis  Can use a Neti Pot which can be purchased from your local drug store. Flonase 2 sprays each nostril for 3 days and then as needed. Given the approaching weekend and holiday, will print RX for Augmentin BID for 10 days to start if you are worse Will also print RX for Diflucan for antibiotic induced yeast infection  RTC as needed or if symptoms persist.

## 2014-10-13 ENCOUNTER — Ambulatory Visit (INDEPENDENT_AMBULATORY_CARE_PROVIDER_SITE_OTHER): Payer: Commercial Managed Care - PPO | Admitting: Family Medicine

## 2014-10-13 ENCOUNTER — Encounter: Payer: Self-pay | Admitting: Family Medicine

## 2014-10-13 VITALS — BP 120/72 | HR 80 | Temp 98.7°F | Wt 192.0 lb

## 2014-10-13 DIAGNOSIS — L729 Follicular cyst of the skin and subcutaneous tissue, unspecified: Secondary | ICD-10-CM

## 2014-10-13 DIAGNOSIS — B379 Candidiasis, unspecified: Secondary | ICD-10-CM

## 2014-10-13 DIAGNOSIS — L089 Local infection of the skin and subcutaneous tissue, unspecified: Secondary | ICD-10-CM

## 2014-10-13 DIAGNOSIS — T3695XA Adverse effect of unspecified systemic antibiotic, initial encounter: Secondary | ICD-10-CM

## 2014-10-13 MED ORDER — FLUCONAZOLE 150 MG PO TABS: 150.0000 mg | ORAL_TABLET | Freq: Once | ORAL | Status: DC

## 2014-10-13 MED ORDER — DOXYCYCLINE HYCLATE 100 MG PO TABS: 100.0000 mg | ORAL_TABLET | Freq: Two times a day (BID) | ORAL | Status: DC

## 2014-10-13 NOTE — Assessment & Plan Note (Signed)
Worse after she tried to express pus out of it at home  Small and firm- not fluctuant Disc home care- warm compresses and hygiene and use of abx ointment  Doxycycline 100 mg bid for 10 d Diflucan for yeast inf times one if needed  Update if not starting to improve in a week or if worsening

## 2014-10-13 NOTE — Progress Notes (Signed)
Pre visit review using our clinic review tool, if applicable. No additional management support is needed unless otherwise documented below in the visit note. 

## 2014-10-13 NOTE — Progress Notes (Signed)
Subjective:    Patient ID: Hannah Foley, female    DOB: 08/04/1981, 34 y.o.   MRN: [id number]  HPI Has a cyst/ skin issue on R breast   Started like a pimple (there is a pore that stays clogged)  Expressed pus out of it and it got worse/red and sore   No fever   Needs tx for yeast infx if she take an antibiotic  Patient Active Problem List   Diagnosis Date Noted  . Well woman exam with routine gynecological exam 08/03/2014  . Tobacco use disorder 08/03/2014  . High cholesterol 09/06/2013  . Morbid obesity 06/14/2013  . Smoker 05/30/2013  . Endometriosis 07/24/2011  . Low back pain 03/12/2011  . Diabetes type 2, controlled 10/25/2010   Past Medical History  Diagnosis Date  . Diabetes mellitus     type 2   Past Surgical History  Procedure Laterality Date  . Cholecystectomy    . Vaginal hysterectomy      laproscopy, hyst for endometriosis   History  Substance Use Topics  . Smoking status: Current Every Day Smoker -- 1.00 packs/day    Types: Cigarettes  . Smokeless tobacco: Never Used  . Alcohol Use: 0.0 oz/week    0 Standard drinks or equivalent per week     Comment: occasional   Family History  Problem Relation Age of Onset  . Cancer Paternal Aunt [id number]    breast  . Cancer Other     breast   No Known Allergies Current Outpatient Prescriptions on File Prior to Visit  Medication Sig Dispense Refill  . norethindrone-ethinyl estradiol-iron (MICROGESTIN FE 1.5/30) 1.5-30 MG-MCG tablet Take 1 tablet by mouth daily. 1 Package 10   No current facility-administered medications on file prior to visit.    Review of Systems Review of Systems  Constitutional: Negative for fever, appetite change, fatigue and unexpected weight change.  Eyes: Negative for pain and visual disturbance.  Respiratory: Negative for cough and shortness of breath.   Cardiovascular: Negative for cp or palpitations    Gastrointestinal: Negative for nausea, diarrhea and constipation.    Genitourinary: Negative for urgency and frequency.  Skin: Negative for pallor or rash  pos for painful skin area with drainage in between breasts  Neurological: Negative for weakness, light-headedness, numbness and headaches.  Hematological: Negative for adenopathy. Does not bruise/bleed easily.  Psychiatric/Behavioral: Negative for dysphoric mood. The patient is not nervous/anxious.         Objective:   Physical Exam  Constitutional: She appears well-developed and well-nourished. No distress.  HENT:  Head: Normocephalic and atraumatic.  Mouth/Throat: Oropharynx is clear and moist.  Eyes: Conjunctivae and EOM are normal. Pupils are equal, round, and reactive to light.  Neck: Normal range of motion. Neck supple.  Cardiovascular: Normal rate and regular rhythm.   Pulmonary/Chest: Effort normal and breath sounds normal.  Lymphadenopathy:    She has no cervical adenopathy.  Neurological: She is alert.  Skin: Skin is warm and dry. No rash noted. There is erythema. No pallor.  1 cm area of erythema and induration in between breasts to the R that is tender and without fluctuance or drainage    Psychiatric: She has a normal mood and affect.          Assessment & Plan:   Problem List Items Addressed This Visit      Musculoskeletal and Integument   Infected cyst of skin    Worse after she tried to express pus out of it  at home  Small and firm- not fluctuant Disc home care- warm compresses and hygiene and use of abx ointment  Doxycycline 100 mg bid for 10 d Diflucan for yeast inf times one if needed  Update if not starting to improve in a week or if worsening         Other Visit Diagnoses    Antibiotic-induced yeast infection    -  Primary    Relevant Medications    fluconazole (DIFLUCAN) tablet 150 mg

## 2014-10-13 NOTE — Patient Instructions (Signed)
I think you have an infected skin cyst  Use a clean warm compress as often as you can  Keep clean with soap and water  Antibiotic ointment over the counter is fine also  Take doxycycline as directed   Update if not starting to improve in a week or if worsening

## 2014-10-18 ENCOUNTER — Encounter: Payer: Self-pay | Admitting: Family Medicine

## 2014-10-18 ENCOUNTER — Ambulatory Visit (INDEPENDENT_AMBULATORY_CARE_PROVIDER_SITE_OTHER): Payer: Commercial Managed Care - PPO | Admitting: Family Medicine

## 2014-10-18 ENCOUNTER — Telehealth: Payer: Self-pay

## 2014-10-18 VITALS — BP 110/70 | HR 78 | Temp 97.8°F | Wt 194.8 lb

## 2014-10-18 DIAGNOSIS — L729 Follicular cyst of the skin and subcutaneous tissue, unspecified: Secondary | ICD-10-CM

## 2014-10-18 DIAGNOSIS — L089 Local infection of the skin and subcutaneous tissue, unspecified: Secondary | ICD-10-CM

## 2014-10-18 NOTE — Patient Instructions (Signed)
Keep using warm compresses, finish the antibiotics, and update us Monday.  I expect this to gradually get better.   Take care.

## 2014-10-18 NOTE — Telephone Encounter (Signed)
Pt left v/m; pt seen 10/13/14 with cyst on rt breast; pt taking abx and area is no better. There is no drainage from cyst; feels tight and area is hard.there is a red area 1" from the cyst. Chest wall is sore. Pt is very concerned since her mother had breast cancer and wants to be seen today. Pt scheduled appt today at 3:30p with Dr Para Marchuncan. (only available appt).Pt wanted note sent to Dr Dayton MartesAron so she would be aware of what is going on.

## 2014-10-18 NOTE — Telephone Encounter (Signed)
Will see today.  Routed to PCP.

## 2014-10-18 NOTE — Progress Notes (Signed)
Pre visit review using our clinic review tool, if applicable. No additional management support is needed unless otherwise documented below in the visit note.  R medial breast.  Noted a "bump", squeezed it, it drained, came in to be seen.  Started on doxy 10/13/14, s/p 5 days of abx.  Is etting smaller.  No redness.  Not a tender now.  No fevers.  H/o cyst in the breast prev.  FH breast cancer.    Meds, vitals, and allergies reviewed.   ROS: See HPI.  Otherwise, noncontributory.  Chaperoned exam.  No redness on the chest wall.  Small (~1cm) area of toughness in the skin at the sternum, just R of midline, very minimally tender.  No drainage.

## 2014-10-19 NOTE — Assessment & Plan Note (Signed)
Much improved, so small it wouldn't need I&D.  She may have to have it removed at some point but with such noted improvement on abx, wouldn't open at this point.  Finish abx, f/u prn, can continue warm compresses.  Routed to PCP as FYI.

## 2015-01-30 ENCOUNTER — Telehealth: Payer: Self-pay | Admitting: Family Medicine

## 2015-01-30 NOTE — Telephone Encounter (Signed)
Friend of pt dropped off form for work regarding physical.  Please call pt when ready to pick up. Placing on [employer] desk, thanks

## 2015-01-30 NOTE — Telephone Encounter (Signed)
Placed in Dr Aron's inbox for review and completion 

## 2015-06-10 ENCOUNTER — Other Ambulatory Visit: Payer: Self-pay | Admitting: Family Medicine

## 2015-06-24 IMAGING — CR SACRUM AND COCCYX - 2+ VIEW
1 series · 4 of 4 positions shown · non-contrast
Comparison: 10/15/2013

CLINICAL DATA: Fall, pain

EXAM:
SACRUM AND COCCYX - 2+ VIEW

[Series 1: ap · 0.17mm/px · 4 of 4 slices shown]
[im 1/4]
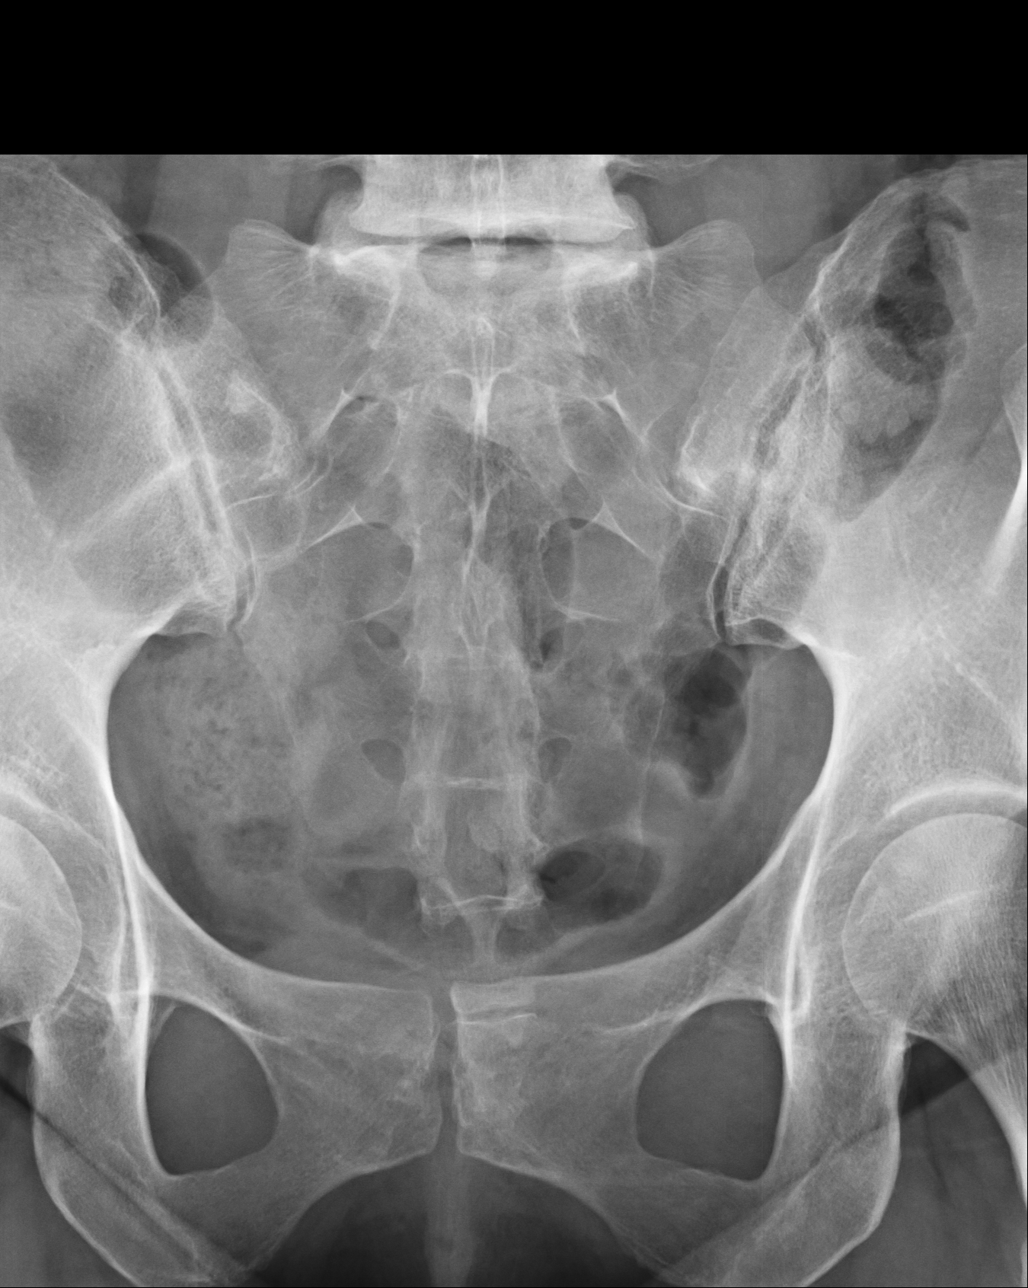
[im 2/4]
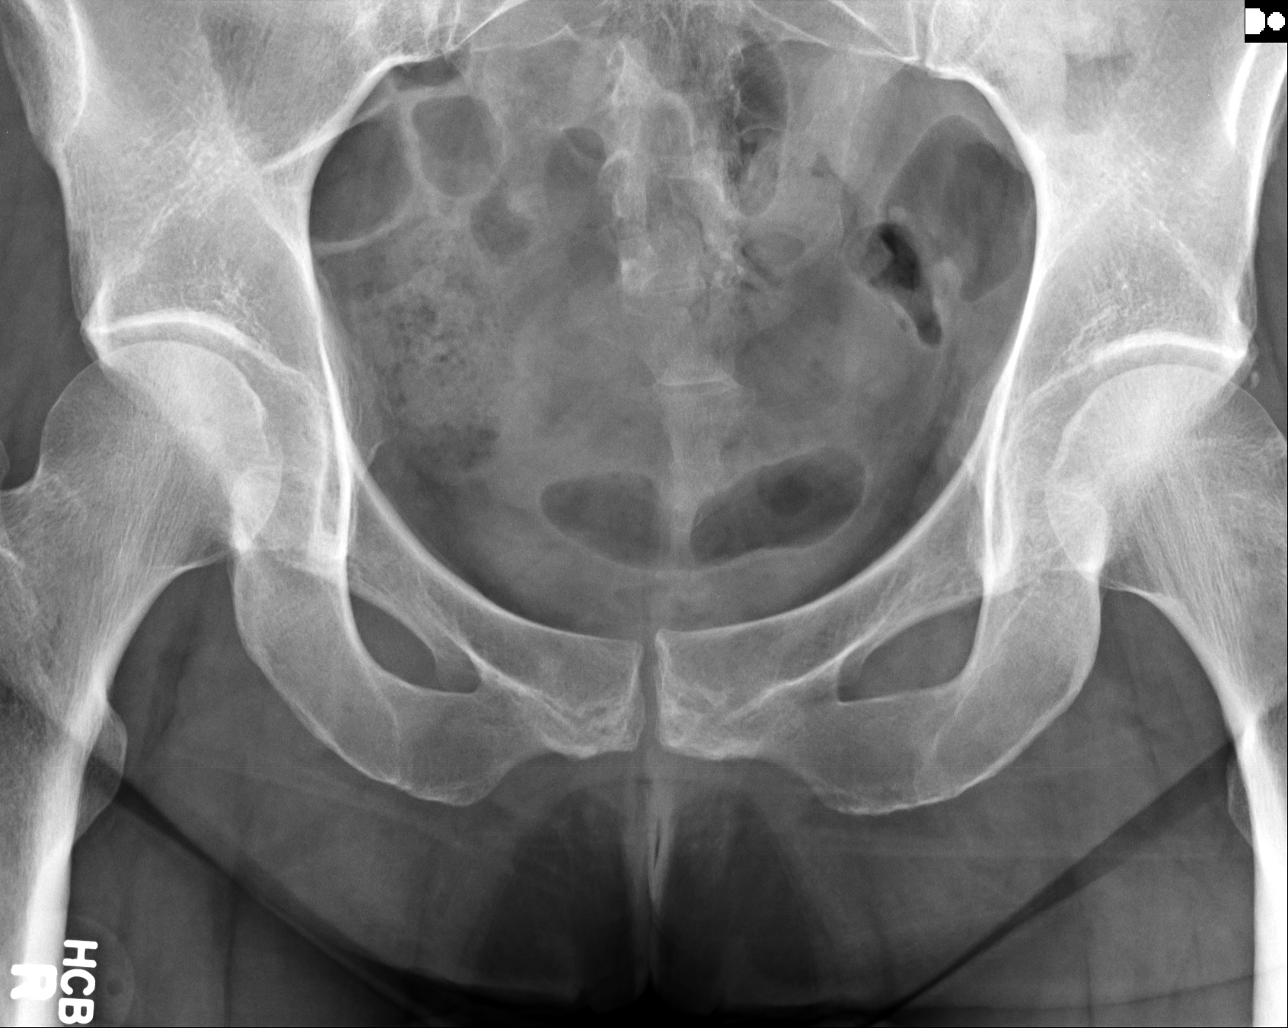
[im 3/4]
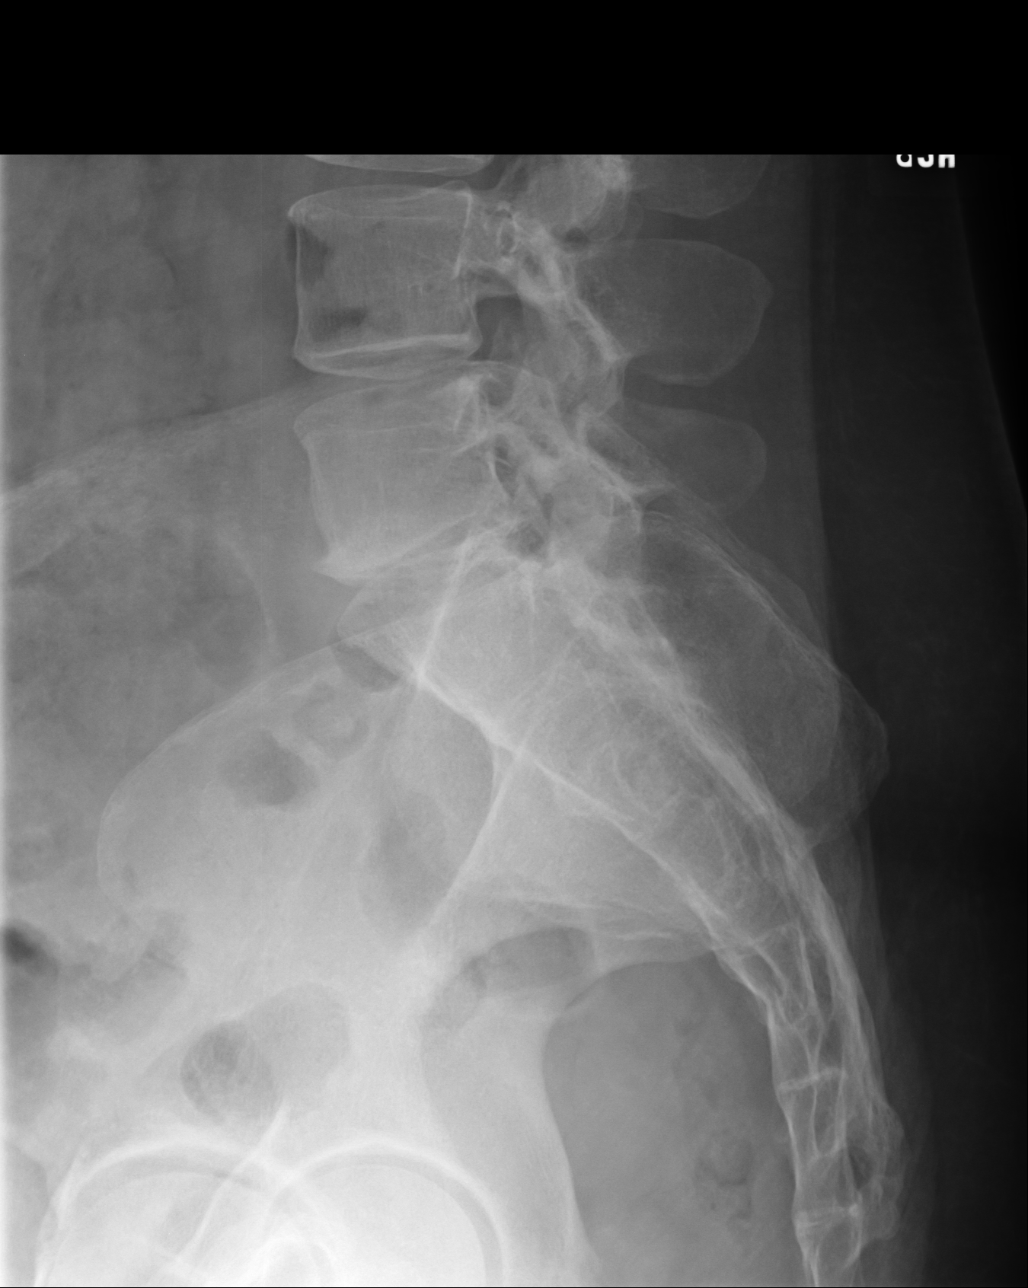
[im 4/4]
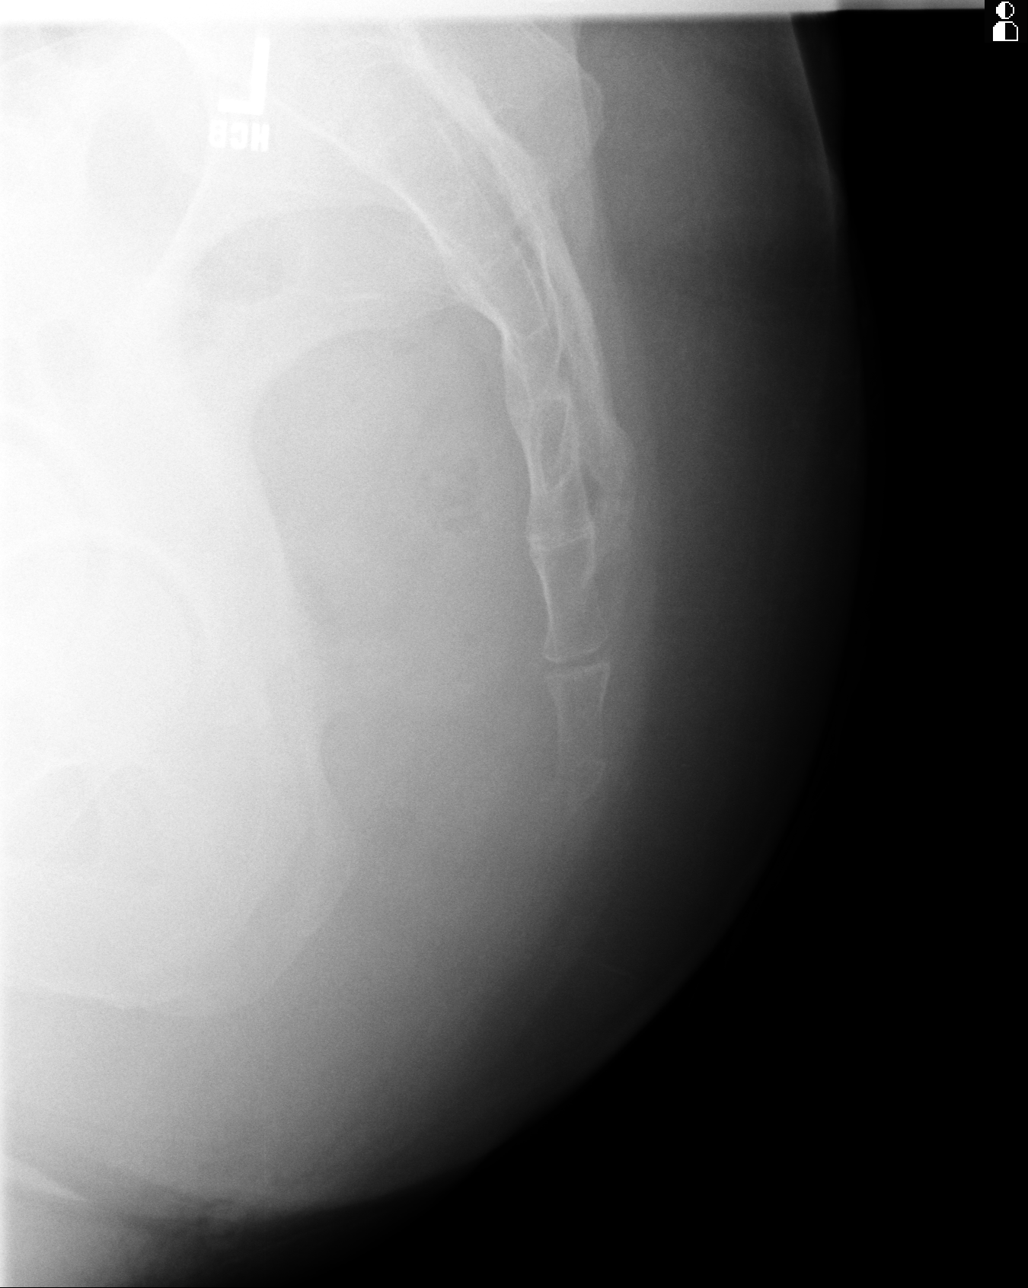

[4 of 4 positions shown; findings below may reference images not displayed]

FINDINGS: There is no evidence of fracture or other focal bone lesions.
Degenerative disc disease and spondylosis noted at L5-S1.
IMPRESSION: No acute osseous finding.

## 2015-08-04 ENCOUNTER — Other Ambulatory Visit: Payer: Self-pay | Admitting: Family Medicine

## 2015-08-06 ENCOUNTER — Other Ambulatory Visit: Payer: Self-pay | Admitting: Family Medicine

## 2015-08-06 ENCOUNTER — Encounter: Payer: Self-pay | Admitting: Family Medicine

## 2015-08-06 ENCOUNTER — Ambulatory Visit (INDEPENDENT_AMBULATORY_CARE_PROVIDER_SITE_OTHER): Payer: Commercial Managed Care - PPO | Admitting: Family Medicine

## 2015-08-06 VITALS — BP 124/70 | HR 63 | Temp 98.0°F | Ht 69.5 in | Wt 222.5 lb

## 2015-08-06 DIAGNOSIS — Z720 Tobacco use: Secondary | ICD-10-CM

## 2015-08-06 DIAGNOSIS — E119 Type 2 diabetes mellitus without complications: Secondary | ICD-10-CM | POA: Diagnosis not present

## 2015-08-06 DIAGNOSIS — Z01419 Encounter for gynecological examination (general) (routine) without abnormal findings: Secondary | ICD-10-CM

## 2015-08-06 DIAGNOSIS — E7800 Pure hypercholesterolemia, unspecified: Secondary | ICD-10-CM

## 2015-08-06 DIAGNOSIS — Z0000 Encounter for general adult medical examination without abnormal findings: Secondary | ICD-10-CM

## 2015-08-06 DIAGNOSIS — F17200 Nicotine dependence, unspecified, uncomplicated: Secondary | ICD-10-CM

## 2015-08-06 MED ORDER — NORETHIN ACE-ETH ESTRAD-FE 1.5-30 MG-MCG PO TABS: 1.0000 | ORAL_TABLET | Freq: Every day | ORAL | Status: DC

## 2015-08-06 MED ORDER — BUPROPION HCL ER (SMOKING DET) 150 MG PO TB12: 150.0000 mg | ORAL_TABLET | Freq: Two times a day (BID) | ORAL | Status: DC

## 2015-08-06 NOTE — Progress Notes (Signed)
Subjective:   Patient ID: Hannah Foley, female    DOB: 11-30-1980, 34 y.o.   MRN: [id number]  Hannah Foley is a pleasant 34 y.o. year old female who presents to clinic today with Annual Exam  on 08/06/2015  HPI:  Received her flu vaccine at work.  Pap smear 07/2014.  DM- has been diet controlled.    Was on Victoza 1.8- not currently taking anything.    She has gained more weight- admits to not following her diet but restarted her diet last week.  Wt Readings from Last 3 Encounters:  08/06/15 222 lb 8 oz (100.925 kg)  10/18/14 194 lb 12 oz (88.338 kg)  10/13/14 192 lb (87.091 kg)   Tobacco abuse- unfortunately smoking again- 1 ppd.  Has quit in past with Zyban.  Lab Results  Component Value Date   HGBA1C 5.9 08/03/2014    Lab Results  Component Value Date   CHOL 175 08/03/2014   HDL 39.40 08/03/2014   LDLCALC 119* 08/03/2014   TRIG 83.0 08/03/2014   CHOLHDL 4 08/03/2014     Wt Readings from Last 3 Encounters:  08/06/15 222 lb 8 oz (100.925 kg)  10/18/14 194 lb 12 oz (88.338 kg)  10/13/14 192 lb (87.091 kg)     No current outpatient prescriptions on file prior to visit.   No current facility-administered medications on file prior to visit.    No Known Allergies  Past Medical History  Diagnosis Date  . Diabetes mellitus     type 2    Past Surgical History  Procedure Laterality Date  . Cholecystectomy    . Vaginal hysterectomy      laproscopy, hyst for endometriosis    Family History  Problem Relation Age of Onset  . Cancer Paternal Aunt 73    breast  . Cancer Other     breast    Social History   Social History  . Marital Status: Single    Spouse Name: N/A  . Number of Children: 0  . Years of Education: N/A   Occupational History  . Social Worker, [employer]    Social History Main Topics  . Smoking status: Current Every Day Smoker -- 1.00 packs/day    Types: Cigarettes  . Smokeless tobacco: Never Used  . Alcohol Use: 0.0  oz/week    0 Standard drinks or equivalent per week     Comment: occasional  . Drug Use: No  . Sexual Activity: Not on file   Other Topics Concern  . Not on file   Social History Narrative   The PMH, PSH, Social History, Family History, Medications, and allergies have been reviewed in [facility], and have been updated if relevant.    Review of Systems  Constitutional: Negative.   HENT: Negative.   Eyes: Negative.   Respiratory: Negative.   Cardiovascular: Negative.   Gastrointestinal: Negative.   Endocrine: Negative.   Genitourinary: Negative.   Musculoskeletal: Negative.   Allergic/Immunologic: Negative.   Neurological: Negative.   Hematological: Negative.   Psychiatric/Behavioral: Negative.   All other systems reviewed and are negative.          Objective:    BP 124/70 mmHg  Pulse 63  Temp(Src) 98 F (36.7 C) (Oral)  Ht 5' 9.5" (1.765 m)  Wt 222 lb 8 oz (100.925 kg)  BMI 32.40 kg/m2  SpO2 98%  LMP 07/10/2015  Wt Readings from Last 3 Encounters:  08/06/15 222 lb 8 oz (100.925 kg)  10/18/14 194 lb 12 oz (88.338 kg)  10/13/14 192 lb (87.091 kg)    Physical Exam   General:  Well-developed,well-nourished,in no acute distress; alert,appropriate and cooperative throughout examination Head:  normocephalic and atraumatic.   Eyes:  vision grossly intact, pupils equal, pupils round, and pupils reactive to light.   Ears:  R ear normal and L ear normal.   Nose:  no external deformity.   Mouth:  good dentition.   Neck:  No deformities, masses, or tenderness noted. Breasts:  No mass, nodules, thickening, tenderness, bulging, retraction, inflamation, nipple discharge or skin changes noted.   Lungs:  Normal respiratory effort, chest expands symmetrically. Lungs are clear to auscultation, no crackles or wheezes. Heart:  Normal rate and regular rhythm. S1 and S2 normal without gallop, murmur, click, rub or other extra sounds. Abdomen:  Bowel sounds positive,abdomen soft and  non-tender without masses, organomegaly or hernias noted. Msk:  No deformity or scoliosis noted of thoracic or lumbar spine.   Extremities:  No clubbing, cyanosis, edema, or deformity noted with normal full range of motion of all joints.   Neurologic:  alert & oriented X3 and gait normal.   Skin:  Intact without suspicious lesions or rashes Cervical Nodes:  No lymphadenopathy noted Axillary Nodes:  No palpable lymphadenopathy Psych:  Cognition and judgment appear intact. Alert and cooperative with normal attention span and concentration. No apparent delusions, illusions, hallucinations      Assessment & Plan:   Well woman exam with routine gynecological exam  High cholesterol  Morbid obesity due to excess calories (HCC)  Controlled type 2 diabetes mellitus without complication, without long-term current use of insulin (HCC) No Follow-up on file.

## 2015-08-06 NOTE — Assessment & Plan Note (Signed)
Smoking cessation instruction/counseling given:  commended patient for quitting and reviewed strategies for preventing relapses   Agrees to Zyban, which worked well for her in the past.

## 2015-08-06 NOTE — Assessment & Plan Note (Signed)
Deteriorated. She is ready to lose weight again.

## 2015-08-06 NOTE — Patient Instructions (Signed)
Great to see you. Happy Holidays.  We will call you with your lab results.  Pick a quit date!  You can do this.

## 2015-08-06 NOTE — Assessment & Plan Note (Signed)
Reviewed preventive care protocols, scheduled due services, and updated immunizations Discussed nutrition, exercise, diet, and healthy lifestyle.  No orders of the defined types were placed in this encounter.   

## 2015-08-06 NOTE — Assessment & Plan Note (Signed)
Diet controlled. Now that she gained weight, reccheck a1c today. No orders of the defined types were placed in this encounter.

## 2015-08-06 NOTE — Progress Notes (Signed)
Pre visit review using our clinic review tool, if applicable. No additional management support is needed unless otherwise documented below in the visit note. 

## 2015-08-07 ENCOUNTER — Encounter: Payer: Self-pay | Admitting: *Deleted

## 2015-08-07 LAB — LIPID PANEL
Cholesterol: 168 mg/dL (ref 0–200)
HDL: 39.5 mg/dL (ref >39.00)
LDL Cholesterol: 105 mg/dL — ABNORMAL HIGH (ref 0–99)
NonHDL: 128.61
Total CHOL/HDL Ratio: 4
Triglycerides: 118 mg/dL (ref 0.0–149.0)
VLDL: 23.6 mg/dL (ref 0.0–40.0)

## 2015-08-07 LAB — COMPREHENSIVE METABOLIC PANEL
ALT: 12 U/L (ref 0–35)
AST: 15 U/L (ref 0–37)
Albumin: 4.4 g/dL (ref 3.5–5.2)
Alkaline Phosphatase: 53 U/L (ref 39–117)
BUN: 12 mg/dL (ref 6–23)
CO2: 26 mEq/L (ref 19–32)
Calcium: 9.5 mg/dL (ref 8.4–10.5)
Chloride: 103 mEq/L (ref 96–112)
Creatinine, Ser: 0.91 mg/dL (ref 0.40–1.20)
GFR: 74.2 mL/min (ref >60.00)
Glucose, Bld: 105 mg/dL — ABNORMAL HIGH (ref 70–99)
Potassium: 4.3 mEq/L (ref 3.5–5.1)
Sodium: 137 mEq/L (ref 135–145)
Total Bilirubin: 0.4 mg/dL (ref 0.2–1.2)
Total Protein: 7.6 g/dL (ref 6.0–8.3)

## 2015-08-07 LAB — CBC WITH DIFFERENTIAL/PLATELET
Basophils Absolute: 0 10*3/uL (ref 0.0–0.1)
Basophils Relative: 0.4 % (ref 0.0–3.0)
Eosinophils Absolute: 0.2 10*3/uL (ref 0.0–0.7)
Eosinophils Relative: 1.7 % (ref 0.0–5.0)
HCT: 46.1 % — ABNORMAL HIGH (ref 36.0–46.0)
Hemoglobin: 15.2 g/dL — ABNORMAL HIGH (ref 12.0–15.0)
Lymphocytes Relative: 28.2 % (ref 12.0–46.0)
Lymphs Abs: 3.3 10*3/uL (ref 0.7–4.0)
MCHC: 33 g/dL (ref 30.0–36.0)
MCV: 85.8 fl (ref 78.0–100.0)
Monocytes Absolute: 0.5 10*3/uL (ref 0.1–1.0)
Monocytes Relative: 4 % (ref 3.0–12.0)
Neutro Abs: 7.6 10*3/uL (ref 1.4–7.7)
Neutrophils Relative %: 65.7 % (ref 43.0–77.0)
Platelets: 332 10*3/uL (ref 150.0–400.0)
RBC: 5.37 Mil/uL — ABNORMAL HIGH (ref 3.87–5.11)
RDW: 13.7 % (ref 11.5–15.5)
WBC: 11.6 10*3/uL — ABNORMAL HIGH (ref 4.0–10.5)

## 2015-08-07 LAB — TSH: TSH: 1.66 u[IU]/mL (ref 0.35–4.50)

## 2015-08-07 LAB — HEMOGLOBIN A1C: Hgb A1c MFr Bld: 6.4 % (ref 4.6–6.5)

## 2015-08-15 ENCOUNTER — Ambulatory Visit: Payer: Commercial Managed Care - PPO | Admitting: Family Medicine

## 2015-09-05 ENCOUNTER — Other Ambulatory Visit: Payer: Self-pay | Admitting: Family Medicine

## 2016-08-02 ENCOUNTER — Other Ambulatory Visit: Payer: Self-pay | Admitting: Family Medicine

## 2016-08-04 ENCOUNTER — Other Ambulatory Visit: Payer: Self-pay | Admitting: Family Medicine

## 2016-08-06 ENCOUNTER — Ambulatory Visit (INDEPENDENT_AMBULATORY_CARE_PROVIDER_SITE_OTHER): Payer: Commercial Managed Care - PPO | Admitting: Family Medicine

## 2016-08-06 DIAGNOSIS — Z0289 Encounter for other administrative examinations: Secondary | ICD-10-CM

## 2016-08-06 DIAGNOSIS — E119 Type 2 diabetes mellitus without complications: Secondary | ICD-10-CM

## 2016-08-06 DIAGNOSIS — Z01419 Encounter for gynecological examination (general) (routine) without abnormal findings: Secondary | ICD-10-CM

## 2016-08-06 NOTE — Progress Notes (Signed)
   Subjective:   Patient ID: Hannah Foley, female    DOB: 06/05/1981, 35 y.o.   MRN: [id number]  Hannah Foley is a pleasant 35 y.o. year old female who presents to clinic today with No chief complaint on file.  on 08/06/2016  HPI:  Visit rescheduled. Lab Results  Component Value Date   HGBA1C 6.4 08/06/2015   Lab Results  Component Value Date   CHOL 168 08/06/2015   HDL 39.50 08/06/2015   LDLCALC 105 (H) 08/06/2015   TRIG 118.0 08/06/2015   CHOLHDL 4 08/06/2015   Current Outpatient Prescriptions on File Prior to Visit  Medication Sig Dispense Refill  . buPROPion (ZYBAN) 150 MG 12 hr tablet Take 1 tablet (150 mg total) by mouth 2 (two) times daily. 60 tablet 3  . norethindrone-ethinyl estradiol-iron (MICROGESTIN FE 1.5/30) 1.5-30 MG-MCG tablet Take 1 tablet by mouth daily. COMPLETE PHYSICAL EXAM REQUIRED FOR ADDITIONAL REFILLS 1 Package 0   No current facility-administered medications on file prior to visit.     No Known Allergies  Past Medical History:  Diagnosis Date  . Diabetes mellitus    type 2    Past Surgical History:  Procedure Laterality Date  . CHOLECYSTECTOMY    . VAGINAL HYSTERECTOMY     laproscopy, hyst for endometriosis    Family History  Problem Relation Age of Onset  . Cancer Paternal Aunt 7150    breast  . Cancer Other     breast    Social History   Social History  . Marital status: Single    Spouse name: N/A  . Number of children: 0  . Years of education: N/A   Occupational History  . Social Worker, [employer]   Social History Main Topics  . Smoking status: Current Every Day Smoker    Packs/day: 1.00    Types: Cigarettes  . Smokeless tobacco: Never Used  . Alcohol use 0.0 oz/week     Comment: occasional  . Drug use: No  . Sexual activity: Not on file   Other Topics Concern  . Not on file   Social History Narrative  . No narrative on file   The PMH, PSH, Social History, Family History, Medications, and  allergies have been reviewed in [facility], and have been updated if relevant.  Review of Systems       Objective:    There were no vitals taken for this visit.   Physical Exam        Assessment & Plan:   Well woman exam with routine gynecological exam - Plan: CBC with Differential/Platelet, Comprehensive metabolic panel, Lipid panel, TSH  Controlled type 2 diabetes mellitus without complication, without long-term current use of insulin (HCC) - Plan: Hemoglobin A1c No Follow-up on file.

## 2016-08-12 ENCOUNTER — Ambulatory Visit (INDEPENDENT_AMBULATORY_CARE_PROVIDER_SITE_OTHER): Payer: Commercial Managed Care - PPO | Admitting: Family Medicine

## 2016-08-12 ENCOUNTER — Other Ambulatory Visit (HOSPITAL_COMMUNITY)
Admission: RE | Admit: 2016-08-12 | Discharge: 2016-08-12 | Disposition: A | Discharge status: Home / Self Care | Payer: Commercial Managed Care - PPO | Source: Ambulatory Visit | Attending: Family Medicine | Admitting: Family Medicine

## 2016-08-12 ENCOUNTER — Encounter: Payer: Self-pay | Admitting: Family Medicine

## 2016-08-12 ENCOUNTER — Other Ambulatory Visit: Payer: Self-pay | Admitting: Family Medicine

## 2016-08-12 VITALS — BP 124/70 | HR 94 | Temp 98.4°F | Ht 70.0 in | Wt 221.5 lb

## 2016-08-12 DIAGNOSIS — Z01419 Encounter for gynecological examination (general) (routine) without abnormal findings: Secondary | ICD-10-CM | POA: Diagnosis not present

## 2016-08-12 DIAGNOSIS — B373 Candidiasis of vulva and vagina: Secondary | ICD-10-CM | POA: Diagnosis not present

## 2016-08-12 DIAGNOSIS — E119 Type 2 diabetes mellitus without complications: Secondary | ICD-10-CM

## 2016-08-12 DIAGNOSIS — N760 Acute vaginitis: Secondary | ICD-10-CM | POA: Insufficient documentation

## 2016-08-12 DIAGNOSIS — Z1151 Encounter for screening for human papillomavirus (HPV): Secondary | ICD-10-CM | POA: Diagnosis present

## 2016-08-12 DIAGNOSIS — B3731 Acute candidiasis of vulva and vagina: Secondary | ICD-10-CM

## 2016-08-12 DIAGNOSIS — E7800 Pure hypercholesterolemia, unspecified: Secondary | ICD-10-CM | POA: Diagnosis not present

## 2016-08-12 LAB — COMPREHENSIVE METABOLIC PANEL
ALT: 17 U/L (ref 0–35)
AST: 15 U/L (ref 0–37)
Albumin: 4.1 g/dL (ref 3.5–5.2)
Alkaline Phosphatase: 66 U/L (ref 39–117)
BUN: 14 mg/dL (ref 6–23)
CO2: 28 mEq/L (ref 19–32)
Calcium: 9.1 mg/dL (ref 8.4–10.5)
Chloride: 100 mEq/L (ref 96–112)
Creatinine, Ser: 0.78 mg/dL (ref 0.40–1.20)
GFR: 88.15 mL/min (ref >60.00)
Glucose, Bld: 276 mg/dL — ABNORMAL HIGH (ref 70–99)
Potassium: 4.2 mEq/L (ref 3.5–5.1)
Sodium: 135 mEq/L (ref 135–145)
Total Bilirubin: 0.5 mg/dL (ref 0.2–1.2)
Total Protein: 6.8 g/dL (ref 6.0–8.3)

## 2016-08-12 LAB — CBC WITH DIFFERENTIAL/PLATELET
Basophils Absolute: 0.1 10*3/uL (ref 0.0–0.1)
Basophils Relative: 0.6 % (ref 0.0–3.0)
Eosinophils Absolute: 0.1 10*3/uL (ref 0.0–0.7)
Eosinophils Relative: 1.2 % (ref 0.0–5.0)
HCT: 46.1 % — ABNORMAL HIGH (ref 36.0–46.0)
Hemoglobin: 15.4 g/dL — ABNORMAL HIGH (ref 12.0–15.0)
Lymphocytes Relative: 21.8 % (ref 12.0–46.0)
Lymphs Abs: 2.3 10*3/uL (ref 0.7–4.0)
MCHC: 33.4 g/dL (ref 30.0–36.0)
MCV: 83.6 fl (ref 78.0–100.0)
Monocytes Absolute: 0.5 10*3/uL (ref 0.1–1.0)
Monocytes Relative: 4.6 % (ref 3.0–12.0)
Neutro Abs: 7.7 10*3/uL (ref 1.4–7.7)
Neutrophils Relative %: 71.8 % (ref 43.0–77.0)
Platelets: 270 10*3/uL (ref 150.0–400.0)
RBC: 5.52 Mil/uL — ABNORMAL HIGH (ref 3.87–5.11)
RDW: 13.1 % (ref 11.5–15.5)
WBC: 10.7 10*3/uL — ABNORMAL HIGH (ref 4.0–10.5)

## 2016-08-12 LAB — MICROALBUMIN / CREATININE URINE RATIO
Creatinine,U: 168.1 mg/dL
Microalb Creat Ratio: 0.6 mg/g (ref 0.0–30.0)
Microalb, Ur: 1 mg/dL (ref 0.0–1.9)

## 2016-08-12 LAB — LIPID PANEL
Cholesterol: 197 mg/dL (ref 0–200)
HDL: 40.9 mg/dL (ref >39.00)
LDL Cholesterol: 132 mg/dL — ABNORMAL HIGH (ref 0–99)
NonHDL: 156.32
Total CHOL/HDL Ratio: 5
Triglycerides: 120 mg/dL (ref 0.0–149.0)
VLDL: 24 mg/dL (ref 0.0–40.0)

## 2016-08-12 LAB — TSH: TSH: 1.42 u[IU]/mL (ref 0.35–4.50)

## 2016-08-12 LAB — HEMOGLOBIN A1C: Hgb A1c MFr Bld: 9.8 % — ABNORMAL HIGH (ref 4.6–6.5)

## 2016-08-12 MED ORDER — LIRAGLUTIDE 18 MG/3ML ~~LOC~~ SOPN: 1.8000 mg | PEN_INJECTOR | Freq: Every day | SUBCUTANEOUS | 11 refills | Status: DC

## 2016-08-12 MED ORDER — FLUCONAZOLE 150 MG PO TABS: 150.0000 mg | ORAL_TABLET | Freq: Once | ORAL | 3 refills | Status: AC

## 2016-08-12 MED ORDER — NORETHIN ACE-ETH ESTRAD-FE 1.5-30 MG-MCG PO TABS: 1.0000 | ORAL_TABLET | Freq: Every day | ORAL | 3 refills | Status: DC

## 2016-08-12 NOTE — Assessment & Plan Note (Signed)
Deteriorated. Discussed diet and exercise today.

## 2016-08-12 NOTE — Addendum Note (Signed)
Addended by: Desmond DikeKNIGHT, Cresencia Asmus H on: 08/12/2016 08:19 AM   Modules accepted: Orders

## 2016-08-12 NOTE — Assessment & Plan Note (Addendum)
Has been diet controlled.  Repeat labs today. Has gained weight and may possible need to restart Victoza.

## 2016-08-12 NOTE — Addendum Note (Signed)
Addended by: Dianne DunARON, TALIA M on: 08/12/2016 08:11 AM   Modules accepted: Orders

## 2016-08-12 NOTE — Progress Notes (Addendum)
Subjective:   Patient ID: Hannah Foley, female    DOB: 05/17/1981, 35 y.o.   MRN: [id number]  Hannah Foley is a pleasant 35 y.o. year old female who presents to clinic today with Annual Exam (with pap)  on 08/12/2016  HPI:  Last pap smear done by me on 08/03/14 - normal Sexually active with boyfriend only.  DM- has been diet controlled, previously on victoza.  Has gained weight and concerned that she may no longer be diet controlled. Has been getting recurrent vaginal yeast infections.   Wt Readings from Last 3 Encounters:  08/12/16 221 lb 8 oz (100.5 kg)  08/06/15 222 lb 8 oz (100.9 kg)  10/18/14 194 lb 12 oz (88.3 kg)     Lab Results  Component Value Date   HGBA1C 6.4 08/06/2015   Lab Results  Component Value Date   CHOL 168 08/06/2015   HDL 39.50 08/06/2015   LDLCALC 105 (H) 08/06/2015   TRIG 118.0 08/06/2015   CHOLHDL 4 08/06/2015   Lab Results  Component Value Date   CREATININE 0.91 08/06/2015     Review of Systems  Constitutional: Negative.   HENT: Negative.   Eyes: Negative.   Respiratory: Negative.   Cardiovascular: Negative.   Gastrointestinal: Negative.   Endocrine: Negative.   Genitourinary:       + vaginal discharge and itching  Musculoskeletal: Negative.   Skin: Negative.   Allergic/Immunologic: Negative.   Neurological: Negative.   Hematological: Negative.   Psychiatric/Behavioral: Negative.   All other systems reviewed and are negative.      Objective:    BP 124/70   Pulse 94   Temp 98.4 F (36.9 C) (Oral)   Ht 5\' 10"  (1.778 m)   Wt 221 lb 8 oz (100.5 kg)   LMP 08/05/2016   SpO2 97%   BMI 31.78 kg/m    Physical Exam   General:  Well-developed,well-nourished,in no acute distress; alert,appropriate and cooperative throughout examination Head:  normocephalic and atraumatic.   Eyes:  vision grossly intact, PERRL Ears:  R ear normal and L ear normal externally, TMs clear bilaterally Nose:  no external deformity.   Mouth:   good dentition.   Neck:  No deformities, masses, or tenderness noted. Breasts:  No mass, nodules, thickening, tenderness, bulging, retraction, inflamation, nipple discharge or skin changes noted.   Lungs:  Normal respiratory effort, chest expands symmetrically. Lungs are clear to auscultation, no crackles or wheezes. Heart:  Normal rate and regular rhythm. S1 and S2 normal without gallop, murmur, click, rub or other extra sounds. Abdomen:  Bowel sounds positive,abdomen soft and non-tender without masses, organomegaly or hernias noted. Rectal:  no external abnormalities.   Genitalia:  Pelvic Exam:        External: normal female genitalia without lesions or masses        Vagina: normal without lesions or masses + thick white discharge in vault        Cervix: normal without lesions or masses        Adnexa: normal bimanual exam without masses or fullness        Uterus: normal by palpation        Pap smear: performed Msk:  No deformity or scoliosis noted of thoracic or lumbar spine.   Extremities:  No clubbing, cyanosis, edema, or deformity noted with normal full range of motion of all joints.   Neurologic:  alert & oriented X3 and gait normal.   Skin:  Intact without suspicious lesions  or rashes Cervical Nodes:  No lymphadenopathy noted Axillary Nodes:  No palpable lymphadenopathy Psych:  Cognition and judgment appear intact. Alert and cooperative with normal attention span and concentration. No apparent delusions, illusions, hallucinations       Assessment & Plan:   Well woman exam with routine gynecological exam - Plan: Lipid panel, CBC with Differential/Platelet, Comprehensive metabolic panel, TSH  High cholesterol  Controlled type 2 diabetes mellitus without complication, without long-term current use of insulin (HCC) - Plan: Hemoglobin A1c, Microalbumin / creatinine urine ratio No Follow-up on file.

## 2016-08-12 NOTE — Assessment & Plan Note (Signed)
Recurrent,likely due to worsening blood sugars. eRx sent for Diflucan. Await pap smear results today.

## 2016-08-12 NOTE — Assessment & Plan Note (Signed)
Reviewed preventive care protocols, scheduled due services, and updated immunizations Discussed nutrition, exercise, diet, and healthy lifestyle.  Pap smear done today.  Orders Placed This Encounter  Procedures  . Hemoglobin A1c  . Lipid panel  . CBC with Differential/Platelet  . Comprehensive metabolic panel  . TSH  . Microalbumin / creatinine urine ratio

## 2016-08-12 NOTE — Patient Instructions (Signed)
Great to see you. Happy Holidays!  We will call you with your lab results and you can view them online. 

## 2016-08-14 ENCOUNTER — Telehealth: Payer: Self-pay

## 2016-08-14 LAB — CYTOLOGY - PAP
Diagnosis: NEGATIVE
HPV: NOT DETECTED

## 2016-08-14 NOTE — Telephone Encounter (Signed)
Pt left v/m;[facility] advised pt 2 days ago that victoza was requiring a prior auth. Pt request cb with status of PA.

## 2016-08-14 NOTE — Telephone Encounter (Signed)
Spoke to pt and informed her PA form was received yesterday, after my leaving. Form completed and placed in Dr Elmer SowAron's inbox for review

## 2016-08-15 NOTE — Telephone Encounter (Signed)
Rachel from [facility] left v/m requesting cb about status of victoza PA.

## 2016-08-15 NOTE — Telephone Encounter (Signed)
The form is still in Dr Elmer SowAron's inbox awaiting completion

## 2016-08-19 ENCOUNTER — Other Ambulatory Visit: Payer: Self-pay | Admitting: *Deleted

## 2016-08-20 ENCOUNTER — Encounter: Payer: Self-pay | Admitting: Family Medicine

## 2016-08-20 NOTE — Telephone Encounter (Signed)
Form was not completed by Dr Dayton MartesAron before her departure for the remainder of the year; PA completed online and has a 72hr response time. Pharmacy notified awaiting PA response

## 2016-08-20 NOTE — Telephone Encounter (Signed)
Fax received indicating PA approved through 08/20/2017. Copy faxed to pharmacy

## 2016-08-21 MED ORDER — INSULIN PEN NEEDLE 30G X 8 MM MISC: 1.0000 | 11 refills | Status: DC | PRN

## 2016-08-22 MED ORDER — LIRAGLUTIDE 18 MG/3ML ~~LOC~~ SOPN: 1.8000 mg | PEN_INJECTOR | Freq: Every day | SUBCUTANEOUS | 11 refills | Status: DC

## 2017-07-30 ENCOUNTER — Encounter: Payer: Commercial Managed Care - PPO | Admitting: Family Medicine

## 2017-08-06 ENCOUNTER — Other Ambulatory Visit: Payer: Self-pay | Admitting: Family Medicine

## 2017-08-17 ENCOUNTER — Telehealth: Payer: Self-pay

## 2017-08-17 NOTE — Telephone Encounter (Signed)
[facility]/form filled out and faxed to plan/thx dmf

## 2017-08-20 ENCOUNTER — Other Ambulatory Visit: Payer: Self-pay | Admitting: Family Medicine

## 2017-08-20 NOTE — Telephone Encounter (Signed)
Pt must contact prescriber to justify req/she has appt within a week/thx dmf

## 2017-08-24 ENCOUNTER — Other Ambulatory Visit: Payer: Self-pay

## 2017-08-26 ENCOUNTER — Ambulatory Visit (INDEPENDENT_AMBULATORY_CARE_PROVIDER_SITE_OTHER): Payer: Commercial Managed Care - PPO | Admitting: Family Medicine

## 2017-08-26 ENCOUNTER — Encounter: Payer: Self-pay | Admitting: Family Medicine

## 2017-08-26 VITALS — BP 122/84 | HR 90 | Temp 98.7°F | Ht 70.0 in | Wt 213.8 lb

## 2017-08-26 DIAGNOSIS — E7800 Pure hypercholesterolemia, unspecified: Secondary | ICD-10-CM | POA: Diagnosis not present

## 2017-08-26 DIAGNOSIS — Z0000 Encounter for general adult medical examination without abnormal findings: Secondary | ICD-10-CM

## 2017-08-26 DIAGNOSIS — Z0001 Encounter for general adult medical examination with abnormal findings: Secondary | ICD-10-CM | POA: Diagnosis not present

## 2017-08-26 DIAGNOSIS — E119 Type 2 diabetes mellitus without complications: Secondary | ICD-10-CM | POA: Diagnosis not present

## 2017-08-26 LAB — LIPID PANEL
Cholesterol: 185 mg/dL (ref 0–200)
HDL: 34.6 mg/dL — ABNORMAL LOW (ref >39.00)
LDL Cholesterol: 115 mg/dL — ABNORMAL HIGH (ref 0–99)
NonHDL: 150.02
Total CHOL/HDL Ratio: 5
Triglycerides: 175 mg/dL — ABNORMAL HIGH (ref 0.0–149.0)
VLDL: 35 mg/dL (ref 0.0–40.0)

## 2017-08-26 LAB — CBC
HCT: 45.7 % (ref 36.0–46.0)
Hemoglobin: 14.7 g/dL (ref 12.0–15.0)
MCHC: 32.2 g/dL (ref 30.0–36.0)
MCV: 87.6 fl (ref 78.0–100.0)
Platelets: 292 10*3/uL (ref 150.0–400.0)
RBC: 5.22 Mil/uL — ABNORMAL HIGH (ref 3.87–5.11)
RDW: 13.4 % (ref 11.5–15.5)
WBC: 17.6 10*3/uL — ABNORMAL HIGH (ref 4.0–10.5)

## 2017-08-26 LAB — COMPREHENSIVE METABOLIC PANEL
ALT: 10 U/L (ref 0–35)
AST: 13 U/L (ref 0–37)
Albumin: 4 g/dL (ref 3.5–5.2)
Alkaline Phosphatase: 57 U/L (ref 39–117)
BUN: 12 mg/dL (ref 6–23)
CO2: 25 mEq/L (ref 19–32)
Calcium: 9 mg/dL (ref 8.4–10.5)
Chloride: 102 mEq/L (ref 96–112)
Creatinine, Ser: 0.79 mg/dL (ref 0.40–1.20)
GFR: 86.38 mL/min (ref >60.00)
Glucose, Bld: 210 mg/dL — ABNORMAL HIGH (ref 70–99)
Potassium: 4.7 mEq/L (ref 3.5–5.1)
Sodium: 136 mEq/L (ref 135–145)
Total Bilirubin: 0.6 mg/dL (ref 0.2–1.2)
Total Protein: 7.1 g/dL (ref 6.0–8.3)

## 2017-08-26 LAB — HEMOGLOBIN A1C: Hgb A1c MFr Bld: 8.8 % — ABNORMAL HIGH (ref 4.6–6.5)

## 2017-08-26 LAB — TSH: TSH: 1.59 u[IU]/mL (ref 0.35–4.50)

## 2017-08-26 MED ORDER — FLUCONAZOLE 150 MG PO TABS: 150.0000 mg | ORAL_TABLET | Freq: Once | ORAL | 1 refills | Status: AC

## 2017-08-26 MED ORDER — NORETHIN ACE-ETH ESTRAD-FE 1.5-30 MG-MCG PO TABS: 1.0000 | ORAL_TABLET | Freq: Every day | ORAL | 3 refills | Status: DC

## 2017-08-26 NOTE — Assessment & Plan Note (Signed)
Likely deteriorated. Victoza is cost prohibitive.  We talked about starting another rx today. After a1c results returned, she is willing to try Metformin again.  She felt she did not give it enough time last night before she quit taking it.

## 2017-08-26 NOTE — Assessment & Plan Note (Signed)
Reviewed preventive care protocols, scheduled due services, and updated immunizations Discussed nutrition, exercise, diet, and healthy lifestyle. Orders Placed This Encounter  Procedures  . Hemoglobin A1c  . CBC  . Comprehensive metabolic panel  . Lipid panel  . TSH

## 2017-08-26 NOTE — Progress Notes (Signed)
Subjective:   Patient ID: Hannah Foley, female    DOB: 11/17/1980, 37 y.o.   MRN: [id number]  Hannah Foley is a pleasant 37 y.o. year old female who presents to clinic today with Annual Exam (Patient is here today for a CPE without PAP.  Had a 1/2 of a Coke Zero this am and no food.  She states that the Victoza will have to be changed to something more affordable as 2 pens costs her $70.00 and she cannot do it.)  on 08/26/2017  HPI:  Last pap smear done by me on 08/12/16 - normal  DM- had been diet controlled, restarted victoza last year.  Has lost weight since last year but still thinks sugars may be high.  Does not check her FSBS.  She admits to not taking Victoza regularly due to cost.  Had tried Metformin in past but   Due for labs today.  Wt Readings from Last 3 Encounters:  08/26/17 213 lb 12.8 oz (97 kg)  08/12/16 221 lb 8 oz (100.5 kg)  08/06/15 222 lb 8 oz (100.9 kg)     Lab Results  Component Value Date   HGBA1C 9.8 (H) 08/12/2016   Lab Results  Component Value Date   CHOL 197 08/12/2016   HDL 40.90 08/12/2016   LDLCALC 132 (H) 08/12/2016   TRIG 120.0 08/12/2016   CHOLHDL 5 08/12/2016   Lab Results  Component Value Date   CREATININE 0.78 08/12/2016     Review of Systems  Constitutional: Negative.   HENT: Negative.   Eyes: Negative.   Respiratory: Negative.   Cardiovascular: Negative.   Gastrointestinal: Negative.   Endocrine: Negative.   Genitourinary: Negative.   Musculoskeletal: Negative.   Skin: Negative.   Allergic/Immunologic: Negative.   Neurological: Negative.   Hematological: Negative.   Psychiatric/Behavioral: Negative.   All other systems reviewed and are negative.      Objective:    BP 122/84 (BP Location: Left Arm, Patient Position: Sitting, Cuff Size: Normal)   Pulse 90   Temp 98.7 F (37.1 C) (Oral)   Ht 5\' 10"  (1.778 m)   Wt 213 lb 12.8 oz (97 kg)   LMP 08/04/2017   SpO2 97%   BMI 30.68 kg/m   Wt Readings from Last 3  Encounters:  08/26/17 213 lb 12.8 oz (97 kg)  08/12/16 221 lb 8 oz (100.5 kg)  08/06/15 222 lb 8 oz (100.9 kg)    Physical Exam   General:  Well-developed,well-nourished,in no acute distress; alert,appropriate and cooperative throughout examination Head:  normocephalic and atraumatic.   Eyes:  vision grossly intact, PERRL Ears:  R ear normal and L ear normal externally, TMs clear bilaterally Nose:  no external deformity.   Mouth:  good dentition.   Neck:  No deformities, masses, or tenderness noted. Breasts:  No mass, nodules, thickening, tenderness, bulging, retraction, inflamation, nipple discharge or skin changes noted.   Lungs:  Normal respiratory effort, chest expands symmetrically. Lungs are clear to auscultation, no crackles or wheezes. Heart:  Normal rate and regular rhythm. S1 and S2 normal without gallop, murmur, click, rub or other extra sounds. Abdomen:  Bowel sounds positive,abdomen soft and non-tender without masses, organomegaly or hernias noted. Msk:  No deformity or scoliosis noted of thoracic or lumbar spine.   Extremities:  No clubbing, cyanosis, edema, or deformity noted with normal full range of motion of all joints.   Neurologic:  alert & oriented X3 and gait normal.   Skin:  Intact without suspicious lesions or rashes Cervical Nodes:  No lymphadenopathy noted Axillary Nodes:  No palpable lymphadenopathy Psych:  Cognition and judgment appear intact. Alert and cooperative with normal attention span and concentration. No apparent delusions, illusions, hallucinations        Assessment & Plan:   Controlled type 2 diabetes mellitus without complication, without long-term current use of insulin (HCC)  Well woman exam without gynecological exam  High cholesterol No Follow-up on file.

## 2017-08-26 NOTE — Patient Instructions (Signed)
Great to see you.  Happy New Year.  We will call you with your lab results and you can view them online. 

## 2017-08-27 ENCOUNTER — Other Ambulatory Visit: Payer: Self-pay | Admitting: Family Medicine

## 2017-08-27 MED ORDER — METFORMIN HCL 500 MG PO TABS: 500.0000 mg | ORAL_TABLET | Freq: Two times a day (BID) | ORAL | 3 refills | Status: DC

## 2018-03-09 ENCOUNTER — Ambulatory Visit (INDEPENDENT_AMBULATORY_CARE_PROVIDER_SITE_OTHER): Payer: Commercial Managed Care - PPO | Admitting: Family Medicine

## 2018-03-09 ENCOUNTER — Encounter: Payer: Self-pay | Admitting: Family Medicine

## 2018-03-09 ENCOUNTER — Other Ambulatory Visit (HOSPITAL_COMMUNITY)
Admission: RE | Admit: 2018-03-09 | Discharge: 2018-03-09 | Disposition: A | Discharge status: Home / Self Care | Payer: Commercial Managed Care - PPO | Source: Ambulatory Visit | Attending: Family Medicine | Admitting: Family Medicine

## 2018-03-09 VITALS — BP 118/80 | HR 97 | Temp 98.5°F | Ht 70.0 in | Wt 222.2 lb

## 2018-03-09 DIAGNOSIS — F17200 Nicotine dependence, unspecified, uncomplicated: Secondary | ICD-10-CM

## 2018-03-09 DIAGNOSIS — N760 Acute vaginitis: Secondary | ICD-10-CM

## 2018-03-09 DIAGNOSIS — E119 Type 2 diabetes mellitus without complications: Secondary | ICD-10-CM

## 2018-03-09 LAB — POCT UA - MICROALBUMIN
Creatinine, POC: 100 mg/dL
Microalbumin Ur, POC: 80 mg/L

## 2018-03-09 LAB — POCT GLYCOSYLATED HEMOGLOBIN (HGB A1C): Hemoglobin A1C: 10.3 % — AB (ref 4.0–5.6)

## 2018-03-09 MED ORDER — BUPROPION HCL ER (SMOKING DET) 150 MG PO TB12: 150.0000 mg | ORAL_TABLET | Freq: Two times a day (BID) | ORAL | 2 refills | Status: DC

## 2018-03-09 MED ORDER — METFORMIN HCL 1000 MG PO TABS: 1000.0000 mg | ORAL_TABLET | Freq: Two times a day (BID) | ORAL | 3 refills | Status: DC

## 2018-03-09 NOTE — Assessment & Plan Note (Signed)
Deteriorated- has not yet taken doses of diflucan called in by teledoc. Likely due to poorly controlled DM. See below.

## 2018-03-09 NOTE — Assessment & Plan Note (Signed)
Smoking cessation instruction/counseling given:  counseled patient on the dangers of tobacco use, advised patient to stop smoking, and reviewed strategies to maximize success  eRx sent for zyban.  Pt will update me with her progress in 3 months.

## 2018-03-09 NOTE — Assessment & Plan Note (Addendum)
Deteriorated. a1c is 10.3 today. Increase Metformin to 1000 mg twice daily. Follow up in 3 months.

## 2018-03-09 NOTE — Patient Instructions (Signed)
Great to see you. We are increasing your Metformin to 1000 mg twice daily  Please come see me in 3 months. 

## 2018-03-09 NOTE — Progress Notes (Addendum)
Subjective:   Patient ID: Hannah Foley, female    DOB: 02/14/1981, 37 y.o.   MRN: [id number] Foley  Hannah Foley is a pleasant 37 y.o. year old female who presents to clinic today with Vaginal Discharge (Patient is here today C/O possible chronic yeast infection.  She states that she has been struggling with yeast infections all summer. She states that she has been taking her Metformin.  She talked with Tele-doc this am and that she needed to come in since they were reoccuring.) and Nicotine Dependence (Patient states that she is ready to quit smoking but cannot do it on her own.)  on 03/09/2018  HPI:   Recurrent vaginitis- having what she believes are yeast infections all summer.  Talked with tele doc this morning who told her to come in today since they keep re occurring.  He did send in Diflfucan 150 mg po x 1 today and another 1 tab in 3 days.  DM- has been compliant with Metformin 500 mg twice daily. Does not check blood sugars regularly. Lab Results  Component Value Date   HGBA1C 10.3 (A) 03/09/2018   Tobacco abuse- she feels she is ready to quit!  She feel she cannot do it on her own and is asking to discuss treatment options today. Has tried patches in past.  Current Outpatient Medications on File Prior to Visit  Medication Sig Dispense Refill  . Insulin Pen Needle (NOVOFINE) 30G X 8 MM MISC Inject 10 each into the skin as needed. 3 packet 11  . norethindrone-ethinyl estradiol-iron (BLISOVI FE 1.5/30) 1.5-30 MG-MCG tablet Take 1 tablet by mouth daily. 84 tablet 3   No current facility-administered medications on file prior to visit.     No Known Allergies  Past Medical History:  Diagnosis Date  . Diabetes mellitus    type 2    Past Surgical History:  Procedure Laterality Date  . CHOLECYSTECTOMY    . VAGINAL HYSTERECTOMY     laproscopy, hyst for endometriosis    Family History  Problem Relation Age of Onset  . Cancer Paternal Aunt 3050       breast  . Cancer Other       breast    Social History   Socioeconomic History  . Marital status: Single    Spouse name: Not on file  . Number of children: 0  . Years of education: Not on file  . Highest education level: Not on file  Occupational History  . 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 Financial resource strain: Not on file  . Food insecurity:    Worry: Not on file    Inability: Not on file  . Transportation needs:    Medical: Not on file    Non-medical: Not on file  Tobacco Use  . Smoking status: Current Every Day Smoker    Packs/day: 1.00    Types: Cigarettes  . Smokeless tobacco: Never Used  Substance and Sexual Activity  . Alcohol use: Yes    Alcohol/week: 0.0 oz    Comment: occasional  . Drug use: No  . Sexual activity: Not on file  Lifestyle  . Physical activity:    Days per week: Not on file    Minutes per session: Not on file  . Stress: Not on file  Relationships  . Social connections:    Talks on phone: Not on file    Gets together: Not on  file    Attends religious service: Not on file    Active member of club or organization: Not on file    Attends meetings of clubs or organizations: Not on file    Relationship status: Not on file  . Intimate partner violence:    Fear of current or ex partner: Not on file    Emotionally abused: Not on file    Physically abused: Not on file    Forced sexual activity: Not on file  Other Topics Concern  . Not on file  Social History Narrative  . Not on file   The PMH, PSH, Social History, Family History, Medications, and allergies have been reviewed in [facility], and have been updated if relevant.   Review of Systems  Constitutional: Negative.   HENT: Negative.   Respiratory: Negative.   Cardiovascular: Negative.   Gastrointestinal: Negative.   Genitourinary: Positive for vaginal discharge. Negative for decreased urine volume, difficulty urinating, dyspareunia, dysuria, enuresis,  flank pain, frequency, genital sores, hematuria, menstrual problem, pelvic pain, urgency, vaginal bleeding and vaginal pain.  Musculoskeletal: Negative.   Neurological: Negative.   Hematological: Negative.   Psychiatric/Behavioral: Negative.   All other systems reviewed and are negative.      Objective:    BP 118/80 (BP Location: Left Arm, Patient Position: Sitting, Cuff Size: Normal)   Pulse 97   Temp 98.5 F (36.9 C) (Oral)   Ht 5\' 10"  (1.778 m)   Wt 222 lb 3.2 oz (100.8 kg)   LMP 02/23/2018   SpO2 97%   BMI 31.88 kg/m    Physical Exam  Constitutional: She is oriented to person, place, and time. She appears well-developed and well-nourished. No distress.  HENT:  Head: Normocephalic and atraumatic.  Eyes: EOM are normal.  Neck: Normal range of motion.  Cardiovascular: Normal rate.  Pulmonary/Chest: Effort normal.  Abdominal: Soft. Bowel sounds are normal.  Genitourinary: Cervix exhibits discharge. Cervix exhibits no motion tenderness and no friability. Vaginal discharge found.  Musculoskeletal: Normal range of motion.  Neurological: She is alert and oriented to person, place, and time. No cranial nerve deficit.  Skin: Skin is warm and dry. She is not diaphoretic.  Psychiatric: She has a normal mood and affect. Her behavior is normal. Judgment and thought content normal.  Nursing note and vitals reviewed.         Assessment & Plan:   Controlled type 2 diabetes mellitus without complication, without long-term current use of insulin (HCC) - Plan: POCT HgB A1C, POCT UA - Microalbumin  Recurrent vaginitis  Tobacco use disorder No follow-ups on file.

## 2018-03-09 NOTE — Addendum Note (Signed)
Addended by: Lerry LinerFREDERICK, Kimari Coudriet. M on: 03/09/2018 09:30 AM   Modules accepted: Orders

## 2018-03-10 LAB — CERVICOVAGINAL ANCILLARY ONLY: Candida vaginitis: POSITIVE — AB

## 2018-03-17 ENCOUNTER — Telehealth: Payer: Self-pay | Admitting: Family Medicine

## 2018-03-17 NOTE — Telephone Encounter (Signed)
Copied from CRM [phone]. Topic: Quick Communication - See Telephone Encounter >> Mar 17, 2018 11:32 AM Oneal GroutSebastian, Jennifer S wrote: CRM for notification. See Telephone encounter for: 03/17/18. Would like to speak with CMA regarding metformin. Patient has completely changed her diet, after breakfast blood sugar was 111. Would like to know if maybe dosage of metformin could be lowered. Can you please replay with mychart message.

## 2018-03-18 NOTE — Telephone Encounter (Signed)
FYI, Dr. Dayton MartesAron.

## 2018-03-18 NOTE — Telephone Encounter (Signed)
Spoke with the pt, Hannah Foley advise to take her metformin with food and to check her blood glucose before breakfast & before bed (record it and bring it in). Pt verbalized understand.  Pt aware to follow up with PCP in 3 mo.

## 2018-04-09 ENCOUNTER — Other Ambulatory Visit: Payer: Self-pay | Admitting: Family Medicine

## 2018-06-09 ENCOUNTER — Ambulatory Visit: Payer: Commercial Managed Care - PPO | Admitting: Family Medicine

## 2018-06-09 ENCOUNTER — Other Ambulatory Visit: Payer: Self-pay | Admitting: Family Medicine

## 2018-06-09 VITALS — BP 120/82 | HR 98 | Temp 98.3°F | Ht 70.0 in | Wt 212.6 lb

## 2018-06-09 DIAGNOSIS — E0865 Diabetes mellitus due to underlying condition with hyperglycemia: Secondary | ICD-10-CM | POA: Diagnosis not present

## 2018-06-09 DIAGNOSIS — F419 Anxiety disorder, unspecified: Secondary | ICD-10-CM

## 2018-06-09 DIAGNOSIS — Z23 Encounter for immunization: Secondary | ICD-10-CM

## 2018-06-09 DIAGNOSIS — F17200 Nicotine dependence, unspecified, uncomplicated: Secondary | ICD-10-CM

## 2018-06-09 DIAGNOSIS — G4700 Insomnia, unspecified: Secondary | ICD-10-CM

## 2018-06-09 DIAGNOSIS — E119 Type 2 diabetes mellitus without complications: Secondary | ICD-10-CM

## 2018-06-09 LAB — POCT GLYCOSYLATED HEMOGLOBIN (HGB A1C): HbA1c POC (<> result, manual entry): 6.7 % (ref 4.0–5.6)

## 2018-06-09 MED ORDER — METFORMIN HCL ER 750 MG PO TB24: 750.0000 mg | ORAL_TABLET | Freq: Every day | ORAL | 3 refills | Status: DC

## 2018-06-09 MED ORDER — BUSPIRONE HCL 15 MG PO TABS: 15.0000 mg | ORAL_TABLET | Freq: Two times a day (BID) | ORAL | 3 refills | Status: DC

## 2018-06-09 NOTE — Assessment & Plan Note (Signed)
Deteriorated but denies feeling depressed. Start buspar 15 mg twice daily. She is following up with me in a few months but will update me prior to that. The patient indicates understanding of these issues and agrees with the plan.

## 2018-06-09 NOTE — Patient Instructions (Signed)
Great to see you.  Please stop taking Metformin 1000 mg twice daily. Start taking Metformin XR 750 daily.  Buspar 15 mg twice daily. Okay to cut in half and take 7.5 mg twice daily for the first few days if you prefer.  Come see me in 3 months for a physical and follow up.

## 2018-06-09 NOTE — Progress Notes (Signed)
Subjective:   Patient ID: Hannah Foley, female    DOB: 09-Oct-1980, 37 y.o.   MRN: [id number]  HALCYON HECK is a pleasant 37 y.o. year old female who presents to clinic today with Follow-up (Patient is here today for a 8-month-F/U.  At the 7.16.19 visit her A1C was 10.3.  Metformin was increased to 1,000mg  bid and pt was started on Zyban for smoking cessation.  She declines flu shot as she will receive that from work.)  on 06/09/2018  HPI:  DM-  Last saw her on 03/09/18 and at that time was 10.3. Started her back on Metformin but at higher dose- 1000 mg twice daily. Having diarrhea since she started taking.  Does not check blood sugars regularly.  Denies any symptoms of hypoglycemia. Does feel better- no longer having recurrent yeast infections.  Lab Results  Component Value Date   HGBA1C 6.7 06/09/2018    Anxiety- past month, noticing increased anxiety.  Denies feeling depressed. Feels Zyban is helping her to cut back on smoking and she thinks it helps her mood as well. Not having panic attacks but does feel anxious every evening for the past couple of months. Not sleeping well.   Current Outpatient Medications on File Prior to Visit  Medication Sig Dispense Refill  . buPROPion (ZYBAN) 150 MG 12 hr tablet TAKE 1 TABLET(150 MG) BY MOUTH TWICE DAILY 180 tablet 0  . Insulin Pen Needle (NOVOFINE) 30G X 8 MM MISC Inject 10 each into the skin as needed. 3 packet 11  . metFORMIN (GLUCOPHAGE) 1000 MG tablet Take 1 tablet (1,000 mg total) by mouth 2 (two) times daily with a meal. 180 tablet 3  . norethindrone-ethinyl estradiol-iron (BLISOVI FE 1.5/30) 1.5-30 MG-MCG tablet Take 1 tablet by mouth daily. 84 tablet 3   No current facility-administered medications on file prior to visit.     No Known Allergies  Past Medical History:  Diagnosis Date  . Diabetes mellitus    type 2    Past Surgical History:  Procedure Laterality Date  . CHOLECYSTECTOMY    . VAGINAL HYSTERECTOMY     laproscopy, hyst for endometriosis    Family History  Problem Relation Age of Onset  . Cancer Paternal Aunt 63       breast  . Cancer Other        breast    Social History   Socioeconomic History  . Marital status: Single    Spouse name: Not on file  . Number of children: 0  . Years of education: Not on file  . Highest education level: Not on file  Occupational History  . Occupation: Child psychotherapist, Customer service manager care    Employer: [employer]  Social Needs  . Financial resource strain: Not on file  . Food insecurity:    Worry: Not on file    Inability: Not on file  . Transportation needs:    Medical: Not on file    Non-medical: Not on file  Tobacco Use  . Smoking status: Current Every Day Smoker    Packs/day: 1.00    Types: Cigarettes  . Smokeless tobacco: Never Used  Substance and Sexual Activity  . Alcohol use: Yes    Alcohol/week: 0.0 standard drinks    Comment: occasional  . Drug use: No  . Sexual activity: Not on file  Lifestyle  . Physical activity:    Days per week: Not on file    Minutes per session: Not on file  .  Stress: Not on file  Relationships  . Social connections:    Talks on phone: Not on file    Gets together: Not on file    Attends religious service: Not on file    Active member of club or organization: Not on file    Attends meetings of clubs or organizations: Not on file    Relationship status: Not on file  . Intimate partner violence:    Fear of current or ex partner: Not on file    Emotionally abused: Not on file    Physically abused: Not on file    Forced sexual activity: Not on file  Other Topics Concern  . Not on file  Social History Narrative  . Not on file   The PMH, PSH, Social History, Family History, Medications, and allergies have been reviewed in [facility], and have been updated if relevant.  Review of Systems  Gastrointestinal: Positive for diarrhea. Negative for abdominal pain, anal bleeding, blood in stool,  constipation, nausea, rectal pain and vomiting.  Psychiatric/Behavioral: Positive for sleep disturbance. Negative for agitation, behavioral problems, confusion, decreased concentration, dysphoric mood, hallucinations, self-injury and suicidal ideas. The patient is nervous/anxious. The patient is not hyperactive.   All other systems reviewed and are negative.      Objective:    BP 120/82 (BP Location: Left Arm, Patient Position: Sitting, Cuff Size: Normal)   Pulse 98   Temp 98.3 F (36.8 C) (Oral)   Ht 5\' 10"  (1.778 m)   Wt 212 lb 9.6 oz (96.4 kg)   LMP 05/26/2018   SpO2 (!) 70%   BMI 30.50 kg/m    Physical Exam  Constitutional: She is oriented to person, place, and time. She appears well-developed and well-nourished. No distress.  HENT:  Head: Normocephalic and atraumatic.  Eyes: EOM are normal.  Neck: Normal range of motion.  Cardiovascular: Normal rate and regular rhythm.  Pulmonary/Chest: Effort normal and breath sounds normal.  Musculoskeletal: Normal range of motion.  Neurological: She is alert and oriented to person, place, and time. No cranial nerve deficit.  Skin: Skin is warm and dry. She is not diaphoretic.  Psychiatric: She has a normal mood and affect. Her behavior is normal. Judgment and thought content normal.  Nursing note and vitals reviewed.         Assessment & Plan:   Controlled type 2 diabetes mellitus without complication, without long-term current use of insulin (HCC) - Plan: POCT HgB A1C  Need for pneumococcal vaccination - Plan: Pneumococcal polysaccharide vaccine 23-valent greater than or equal to 2yo subcutaneous/IM  Diabetes mellitus due to underlying condition, uncontrolled, with hyperglycemia (HCC)  Insomnia, unspecified type  Anxiety No follow-ups on file.

## 2018-06-09 NOTE — Assessment & Plan Note (Addendum)
Improved.  Having diarrhea with high dose metformin and now that her diabetes is under better control, will decrease dose to 750 XR mg daily- hopefully XR dose will decrease her diarrhea as well. Follow up in 3 months.

## 2018-06-09 NOTE — Assessment & Plan Note (Signed)
Improving with Zyban.  Has cut back but has not yet quit.  She does feel zyban is helping.

## 2018-06-14 ENCOUNTER — Encounter: Payer: Self-pay | Admitting: Family Medicine

## 2018-06-15 ENCOUNTER — Other Ambulatory Visit: Payer: Self-pay

## 2018-06-15 ENCOUNTER — Other Ambulatory Visit: Payer: Self-pay | Admitting: Family Medicine

## 2018-06-15 MED ORDER — METFORMIN HCL ER (MOD) 1000 MG PO TB24: 1000.0000 mg | ORAL_TABLET | Freq: Every day | ORAL | 3 refills | Status: DC

## 2018-06-15 MED ORDER — METFORMIN HCL ER 500 MG PO TB24: ORAL_TABLET | ORAL | 1 refills | Status: DC

## 2018-06-15 NOTE — Progress Notes (Signed)
Ins does not cover Metformin ER 1k mg so per TA ok for me to send in ER 500mg  2qam instead as this is usually preferred/pt will let me know if it is not/thx dmf

## 2018-07-13 ENCOUNTER — Encounter: Payer: Self-pay | Admitting: Family Medicine

## 2018-07-15 ENCOUNTER — Other Ambulatory Visit: Payer: Self-pay

## 2018-07-15 MED ORDER — GLIPIZIDE 5 MG PO TABS: 5.0000 mg | ORAL_TABLET | Freq: Every day | ORAL | 0 refills | Status: DC

## 2018-07-17 ENCOUNTER — Other Ambulatory Visit: Payer: Self-pay | Admitting: Family Medicine

## 2018-08-31 NOTE — Progress Notes (Signed)
   Subjective:   Patient ID: Hannah Foley, female    DOB: 1981-04-22, 38 y.o.   MRN: [id number]  Hannah Foley is a pleasant 39 y.o. year old female who presents to clinic today with No chief complaint on file.  on 09/01/2018  HPI: Cancelled last minute.

## 2018-09-01 ENCOUNTER — Ambulatory Visit (INDEPENDENT_AMBULATORY_CARE_PROVIDER_SITE_OTHER): Payer: Commercial Managed Care - PPO | Admitting: Family Medicine

## 2018-09-01 DIAGNOSIS — E7800 Pure hypercholesterolemia, unspecified: Secondary | ICD-10-CM

## 2018-09-01 DIAGNOSIS — E0865 Diabetes mellitus due to underlying condition with hyperglycemia: Secondary | ICD-10-CM

## 2018-09-01 DIAGNOSIS — F17200 Nicotine dependence, unspecified, uncomplicated: Secondary | ICD-10-CM

## 2018-09-19 ENCOUNTER — Other Ambulatory Visit: Payer: Self-pay | Admitting: Family Medicine

## 2018-09-28 ENCOUNTER — Other Ambulatory Visit: Payer: Self-pay | Admitting: Family Medicine

## 2018-10-03 ENCOUNTER — Other Ambulatory Visit: Payer: Self-pay | Admitting: Family Medicine

## 2018-10-06 ENCOUNTER — Other Ambulatory Visit: Payer: Self-pay | Admitting: Family Medicine

## 2018-10-06 ENCOUNTER — Ambulatory Visit (INDEPENDENT_AMBULATORY_CARE_PROVIDER_SITE_OTHER): Payer: Commercial Managed Care - PPO | Admitting: Family Medicine

## 2018-10-06 VITALS — BP 128/86 | HR 96 | Temp 98.3°F | Ht 70.0 in | Wt 231.2 lb

## 2018-10-06 DIAGNOSIS — E7800 Pure hypercholesterolemia, unspecified: Secondary | ICD-10-CM

## 2018-10-06 DIAGNOSIS — E0865 Diabetes mellitus due to underlying condition with hyperglycemia: Secondary | ICD-10-CM

## 2018-10-06 DIAGNOSIS — F419 Anxiety disorder, unspecified: Secondary | ICD-10-CM

## 2018-10-06 DIAGNOSIS — E119 Type 2 diabetes mellitus without complications: Secondary | ICD-10-CM | POA: Diagnosis not present

## 2018-10-06 DIAGNOSIS — F17200 Nicotine dependence, unspecified, uncomplicated: Secondary | ICD-10-CM

## 2018-10-06 DIAGNOSIS — Z0000 Encounter for general adult medical examination without abnormal findings: Secondary | ICD-10-CM | POA: Diagnosis not present

## 2018-10-06 LAB — CBC WITH DIFFERENTIAL/PLATELET
Basophils Absolute: 0.1 10*3/uL (ref 0.0–0.1)
Basophils Relative: 0.9 % (ref 0.0–3.0)
Eosinophils Absolute: 0.1 10*3/uL (ref 0.0–0.7)
Eosinophils Relative: 1.1 % (ref 0.0–5.0)
HCT: 44.4 % (ref 36.0–46.0)
Hemoglobin: 14.7 g/dL (ref 12.0–15.0)
Lymphocytes Relative: 23.7 % (ref 12.0–46.0)
Lymphs Abs: 2 10*3/uL (ref 0.7–4.0)
MCHC: 33.1 g/dL (ref 30.0–36.0)
MCV: 84.5 fl (ref 78.0–100.0)
Monocytes Absolute: 0.5 10*3/uL (ref 0.1–1.0)
Monocytes Relative: 5.9 % (ref 3.0–12.0)
Neutro Abs: 5.7 10*3/uL (ref 1.4–7.7)
Neutrophils Relative %: 68.4 % (ref 43.0–77.0)
Platelets: 298 10*3/uL (ref 150.0–400.0)
RBC: 5.25 Mil/uL — ABNORMAL HIGH (ref 3.87–5.11)
RDW: 13.4 % (ref 11.5–15.5)
WBC: 8.4 10*3/uL (ref 4.0–10.5)

## 2018-10-06 LAB — COMPREHENSIVE METABOLIC PANEL
ALT: 9 U/L (ref 0–35)
AST: 10 U/L (ref 0–37)
Albumin: 3.9 g/dL (ref 3.5–5.2)
Alkaline Phosphatase: 60 U/L (ref 39–117)
BUN: 12 mg/dL (ref 6–23)
CO2: 23 mEq/L (ref 19–32)
Calcium: 8.8 mg/dL (ref 8.4–10.5)
Chloride: 101 mEq/L (ref 96–112)
Creatinine, Ser: 0.8 mg/dL (ref 0.40–1.20)
GFR: 79.64 mL/min (ref >60.00)
Glucose, Bld: 181 mg/dL — ABNORMAL HIGH (ref 70–99)
Potassium: 4.3 mEq/L (ref 3.5–5.1)
Sodium: 134 mEq/L — ABNORMAL LOW (ref 135–145)
Total Bilirubin: 0.4 mg/dL (ref 0.2–1.2)
Total Protein: 7 g/dL (ref 6.0–8.3)

## 2018-10-06 LAB — LIPID PANEL
Cholesterol: 185 mg/dL (ref 0–200)
HDL: 37.3 mg/dL — ABNORMAL LOW (ref >39.00)
LDL Cholesterol: 128 mg/dL — ABNORMAL HIGH (ref 0–99)
NonHDL: 148.19
Total CHOL/HDL Ratio: 5
Triglycerides: 99 mg/dL (ref 0.0–149.0)
VLDL: 19.8 mg/dL (ref 0.0–40.0)

## 2018-10-06 LAB — POCT GLYCOSYLATED HEMOGLOBIN (HGB A1C)
HbA1c POC (<> result, manual entry): 8.1 % (ref 4.0–5.6)
Hemoglobin A1C: 8.1 % — AB (ref 4.0–5.6)

## 2018-10-06 LAB — TSH: TSH: 1.83 u[IU]/mL (ref 0.35–4.50)

## 2018-10-06 NOTE — Assessment & Plan Note (Signed)
Due for labs today. 

## 2018-10-06 NOTE — Assessment & Plan Note (Signed)
Improved, still having some anxiety but she feels that is more her personality. She does not want to restart buspar.

## 2018-10-06 NOTE — Assessment & Plan Note (Signed)
Reviewed preventive care protocols, scheduled due services, and updated immunizations Discussed nutrition, exercise, diet, and healthy lifestyle.  

## 2018-10-06 NOTE — Assessment & Plan Note (Signed)
Deteriorated due to diet.  She is motivated to go back to diabetic diet.

## 2018-10-06 NOTE — Progress Notes (Signed)
Subjective:   Patient ID: Hannah Foley, female    DOB: 02-10-1981, 38 y.o.   MRN: [id number]  Hannah Foley is a pleasant 38 y.o. year old female who presents to clinic today with Annual Exam (Patient is here today for a CPE without PAP.  She had coffee with cream at 6:30am.  She is also here today to F/U with anxiety and depression plz see PHQ-9 & GAD-7.)  on 10/06/2018  HPI:   Health Maintenance  Topic Date Due  . OPHTHALMOLOGY EXAM  10/06/2018 (Originally 10/23/2017)  . HEMOGLOBIN A1C  12/09/2018  . URINE MICROALBUMIN  03/10/2019  . PAP SMEAR-Modifier  08/13/2019  . FOOT EXAM  10/07/2019  . TETANUS/TDAP  08/27/2023  . INFLUENZA VACCINE  Completed  . PNEUMOCOCCAL POLYSACCHARIDE VACCINE AGE 70-64 HIGH RISK  Completed  . HIV Screening  Completed   No h/o abnormal pap smears- UTD pap. Will be due for mammogram in 03/2019. Pleased with current OCPs.   DM- had been diet controlled for some time but when I saw her in 02/2018, a1c jumped to 10.3.  Restarted her on medication and a1c improved to 6.7 in 05/2018. Currently taking Metformin XR 500 mg- two tablets every morning and glipizide 5 mg daily before breakfast. Admits to not eating well during the holidays. Lab Results  Component Value Date   HGBA1C 8.1 (A) 10/06/2018   HGBA1C 8.1 10/06/2018     Lab Results  Component Value Date   CHOL 185 08/26/2017   HDL 34.60 (L) 08/26/2017   LDLCALC 115 (H) 08/26/2017   TRIG 175.0 (H) 08/26/2017   CHOLHDL 5 08/26/2017    Anxiety- when I saw her in 05/2018, she noticed increased anxiety over the month prior.  She denied feeling depressed.  Felt Zyban is helping her to cut back on smoking and she thinks it helps her mood as well. She was having panic attacks and not sleeping well.  Added Buspar 15 mg twice daily.  She stopped taking it because she feels she no longer needs it. Depression screen [facility] 2/9 10/06/2018 08/26/2017  Decreased Interest 0 0  Down, Depressed, Hopeless 0 0  PHQ - 2  Score 0 0  Altered sleeping 0 -  Tired, decreased energy 0 -  Change in appetite 0 -  Feeling bad or failure about yourself  0 -  Trouble concentrating 0 -  Moving slowly or fidgety/restless 0 -  Suicidal thoughts 0 -  PHQ-9 Score 0 -  Difficult doing work/chores Not difficult at all -   GAD 7 : Generalized Anxiety Score 10/06/2018  Nervous, Anxious, on Edge 1  Control/stop worrying 1  Worry too much - different things 1  Trouble relaxing 1  Restless 1  Easily annoyed or irritable 3  Afraid - awful might happen 0  Total GAD 7 Score 8  Anxiety Difficulty Somewhat difficult     Current Outpatient Medications on File Prior to Visit  Medication Sig Dispense Refill  . BLISOVI FE 1.5/30 1.5-30 MG-MCG tablet TAKE 1 TABLET BY MOUTH DAILY 84 tablet 0  . buPROPion (ZYBAN) 150 MG 12 hr tablet TAKE 1 TABLET(150 MG) BY MOUTH TWICE DAILY 180 tablet 0  . busPIRone (BUSPAR) 15 MG tablet TAKE 1 TABLET(15 MG) BY MOUTH TWICE DAILY 180 tablet 3  . glipiZIDE (GLUCOTROL) 5 MG tablet Take 1 tablet (5 mg total) by mouth daily before breakfast. 90 tablet 0  . metFORMIN (GLUCOPHAGE-XR) 500 MG 24 hr tablet Take 2qam 180 tablet  1   No current facility-administered medications on file prior to visit.     No Known Allergies  Past Medical History:  Diagnosis Date  . Diabetes mellitus    type 2    Past Surgical History:  Procedure Laterality Date  . CHOLECYSTECTOMY    . VAGINAL HYSTERECTOMY     laproscopy, hyst for endometriosis    Family History  Problem Relation Age of Onset  . Cancer Paternal Aunt 3750       breast  . Cancer Other        breast    Social History   Socioeconomic History  . Marital status: Single    Spouse name: Not on file  . Number of children: 0  . Years of education: Not on file  . Highest education level: Not on file  Occupational History  . 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 Financial resource strain:  Not on file  . Food insecurity:    Worry: Not on file    Inability: Not on file  . Transportation needs:    Medical: Not on file    Non-medical: Not on file  Tobacco Use  . Smoking status: Current Every Day Smoker    Packs/day: 1.00    Types: Cigarettes  . Smokeless tobacco: Never Used  Substance and Sexual Activity  . Alcohol use: Yes    Alcohol/week: 0.0 standard drinks    Comment: occasional  . Drug use: No  . Sexual activity: Not on file  Lifestyle  . Physical activity:    Days per week: Not on file    Minutes per session: Not on file  . Stress: Not on file  Relationships  . Social connections:    Talks on phone: Not on file    Gets together: Not on file    Attends religious service: Not on file    Active member of club or organization: Not on file    Attends meetings of clubs or organizations: Not on file    Relationship status: Not on file  . Intimate partner violence:    Fear of current or ex partner: Not on file    Emotionally abused: Not on file    Physically abused: Not on file    Forced sexual activity: Not on file  Other Topics Concern  . Not on file  Social History Narrative  . Not on file   The PMH, PSH, Social History, Family History, Medications, and allergies have been reviewed [facility], and have been updated if relevant.   Review of Systems  Constitutional: Negative.   HENT: Negative.   Eyes: Negative.   Respiratory: Negative.   Cardiovascular: Negative.   Gastrointestinal: Negative.   Endocrine: Negative.   Genitourinary: Negative.   Musculoskeletal: Negative.   Skin: Negative.   Allergic/Immunologic: Negative.   Neurological: Negative.   Hematological: Negative.   Psychiatric/Behavioral: Negative for agitation, behavioral problems, confusion, decreased concentration, dysphoric mood, hallucinations, self-injury, sleep disturbance and suicidal ideas. The patient is nervous/anxious. The patient is not hyperactive.   All other systems reviewed  and are negative.      Objective:    BP 128/86 (BP Location: Left Arm, Patient Position: Sitting, Cuff Size: Normal)   Pulse 96   Temp 98.3 F (36.8 C) (Oral)   Ht 5\' 10"  (1.778 m)   Wt 231 lb 3.2 oz (104.9 kg)   LMP 10/04/2018 (Exact Date)   SpO2 98%  BMI 33.17 kg/m    Physical Exam   General:  Well-developed,well-nourished,in no acute distress; alert,appropriate and cooperative throughout examination Head:  normocephalic and atraumatic.   Eyes:  vision grossly intact, PERRL Ears:  R ear normal and L ear normal externally, TMs clear bilaterally Nose:  no external deformity.   Mouth:  good dentition.   Neck:  No deformities, masses, or tenderness noted. Lungs:  Normal respiratory effort, chest expands symmetrically. Lungs are clear to auscultation, no crackles or wheezes. Heart:  Normal rate and regular rhythm. S1 and S2 normal without gallop, murmur, click, rub or other extra sounds. Abdomen:  Bowel sounds positive,abdomen soft and non-tender without masses, organomegaly or hernias noted. Msk:  No deformity or scoliosis noted of thoracic or lumbar spine.   Extremities:  No clubbing, cyanosis, edema, or deformity noted with normal full range of motion of all joints.   Neurologic:  alert & oriented X3 and gait normal.   Skin:  Intact without suspicious lesions or rashes Cervical Nodes:  No lymphadenopathy noted Axillary Nodes:  No palpable lymphadenopathy Psych:  Cognition and judgment appear intact. Alert and cooperative with normal attention span and concentration. No apparent delusions, illusions, hallucinations       Assessment & Plan:   Well woman exam without gynecological exam  Diabetes mellitus due to underlying condition, uncontrolled, with hyperglycemia (HCC) - Plan: POCT HgB A1C, TSH  Anxiety  High cholesterol - Plan: Comprehensive metabolic panel, Lipid panel, CBC with Differential/Platelet  Tobacco use disorder No follow-ups on file.

## 2018-10-06 NOTE — Patient Instructions (Addendum)
Great to see you. I will call you with your lab results from today and you can view them online.   Let's work on getting back on your diabetic diet.  Come see me in 3 months.

## 2018-11-23 ENCOUNTER — Other Ambulatory Visit: Payer: Self-pay

## 2018-11-23 ENCOUNTER — Encounter: Payer: Self-pay | Admitting: Family Medicine

## 2018-11-23 MED ORDER — BUPROPION HCL ER (SR) 150 MG PO TB12: 150.0000 mg | ORAL_TABLET | Freq: Two times a day (BID) | ORAL | 0 refills | Status: DC

## 2018-11-30 ENCOUNTER — Encounter: Payer: Self-pay | Admitting: Family Medicine

## 2018-11-30 ENCOUNTER — Ambulatory Visit (INDEPENDENT_AMBULATORY_CARE_PROVIDER_SITE_OTHER): Payer: Commercial Managed Care - PPO | Admitting: Family Medicine

## 2018-11-30 DIAGNOSIS — M5416 Radiculopathy, lumbar region: Secondary | ICD-10-CM | POA: Insufficient documentation

## 2018-11-30 MED ORDER — PREDNISONE 10 MG PO TABS: ORAL_TABLET | ORAL | 0 refills | Status: DC

## 2018-11-30 NOTE — Progress Notes (Signed)
Virtual Visit via Video   I connected with Hannah Foley on 11/30/18 at  1:00 PM EDT by a video enabled telemedicine application and verified that I am speaking with the correct person using two identifiers. Location patient: Home Location provider: [facility], Office Persons participating in the virtual visit: Hannah Foley, Talia Aron, MD   I discussed the limitations of evaluation and management by telemedicine and the availability of in person appointments. The patient expressed understanding and agreed to proceed.  Subjective:   HPI:  Low back pain-  H/o lumbar DDD.  She is having a flare that started on Wednesday after she bent down to get seomething. She has Tx with OTC Ibuprofen, heating pad, and stretches but symptoms persist and are worsening. Radiates into both hips and down both legs to ankles.   This is new because it used to be right sided and now it is both.   Tingles badly at night right worse than left leg. Tried warm epsom salt bath. No better.  Review of Systems  Genitourinary: Negative.   Musculoskeletal: Positive for back pain.  Skin: Negative.   Neurological: Positive for numbness. Negative for dizziness, tremors, seizures, syncope, facial asymmetry, speech difficulty, weakness, light-headedness and headaches.    ROS: See pertinent positives and negatives per HPI.  Patient Active Problem List   Diagnosis Date Noted  . Insomnia 06/09/2018  . Anxiety 06/09/2018  . Tobacco use disorder 08/03/2014  . High cholesterol 09/06/2013  . Morbid obesity (HCC) 06/14/2013  . Low back pain 03/12/2011  . Diabetes mellitus due to underlying condition, uncontrolled (HCC) 10/25/2010    Social History   Tobacco Use  . Smoking status: Current Every Day Smoker    Packs/day: 1.00    Types: Cigarettes  . Smokeless tobacco: Never Used  Substance Use Topics  . Alcohol use: Yes    Alcohol/week: 0.0 standard drinks    Comment: occasional    Current Outpatient  Medications:  .  BLISOVI FE 1.5/30 1.5-30 MG-MCG tablet, TAKE 1 TABLET BY MOUTH DAILY, Disp: 84 tablet, Rfl: 0 .  buPROPion (WELLBUTRIN SR) 150 MG 12 hr tablet, Take 1 tablet (150 mg total) by mouth 2 (two) times daily., Disp: 180 tablet, Rfl: 0 .  glipiZIDE (GLUCOTROL) 5 MG tablet, TAKE 1 TABLET BY MOUTH DAILY BEFORE BREAKFAST, Disp: 90 tablet, Rfl: 0 .  metFORMIN (GLUCOPHAGE-XR) 500 MG 24 hr tablet, Take 2qam, Disp: 180 tablet, Rfl: 1 .  predniSONE (DELTASONE) 10 MG tablet, 3 tabs by mouth x 3 days, 2 tabs by mouth x 2 days, 1 tab by mouth x 2 days and stop., Disp: 15 tablet, Rfl: 0  No Known Allergies  Objective:  Temp 98.3 F (36.8 C) (Oral)   Ht 5\' 10"  (1.778 m)   Wt 234 lb (106.1 kg)   BMI 33.58 kg/m   VITALS: Per patient if applicable, see vitals. GENERAL: Alert, appears well and in no acute distress. HEENT: Atraumatic, conjunctiva clear, no obvious abnormalities on inspection of external nose and ears. NECK: Normal movements of the head and neck. CARDIOPULMONARY: No increased WOB. Speaking in clear sentences. I:E ratio WNL.  MS: Moves all visible extremities without noticeable abnormality. PSYCH: Pleasant and cooperative, well-groomed. Speech normal rate and rhythm. Affect is appropriate. Insight and judgement are appropriate. Attention is focused, linear, and appropriate.  NEURO: CN grossly intact. Oriented as arrived to appointment on time with no prompting. Moves both UE equally.  Ataxic gait SKIN: No obvious lesions, wounds, erythema,  or cyanosis noted on face or hands.  Assessment and Plan:   There are no diagnoses linked to this encounter.  . Reviewed expectations re: course of current medical issues. . Discussed self-management of symptoms. . Outlined signs and symptoms indicating need for more acute intervention. . Patient verbalized understanding and all questions were answered. Marland Kitchen Health Maintenance issues including appropriate healthy diet, exercise, and smoking  avoidance were discussed with patient. . See orders for this visit as documented in the electronic medical record.  Ruthe Mannan, MD 11/30/2018

## 2018-11-30 NOTE — Assessment & Plan Note (Signed)
Acute on chronic No red flag symptoms. Will treat with short course prednisone. Discussed to take it in the morning and with food.  Home exercises, continue heating pad.  Follow up in 2 weeks. The patient indicates understanding of these issues and agrees with the plan.

## 2018-12-14 ENCOUNTER — Encounter

## 2018-12-22 ENCOUNTER — Telehealth: Payer: Self-pay | Admitting: Family Medicine

## 2018-12-22 DIAGNOSIS — E0865 Diabetes mellitus due to underlying condition with hyperglycemia: Secondary | ICD-10-CM

## 2018-12-22 NOTE — Telephone Encounter (Signed)
Yes she can have lab work prior to virtual visit.

## 2018-12-22 NOTE — Telephone Encounter (Signed)
Patient called concerned about having her A1C checked. She had an appt on 01/04/2019 and we were trying to get in touch with the patient to change appointment to a virtual visit. Patient is okay with virtual visit but feels like she needs her lab work done before her visit. She wants to know if she could come in first to get lab work done then we can schedule her for a virtual visit? Please advise.

## 2018-12-26 ENCOUNTER — Other Ambulatory Visit: Payer: Self-pay | Admitting: Family Medicine

## 2018-12-27 MED ORDER — NORETHIN ACE-ETH ESTRAD-FE 1.5-30 MG-MCG PO TABS: 1.0000 | ORAL_TABLET | Freq: Every day | ORAL | 1 refills | Status: DC

## 2018-12-28 NOTE — Addendum Note (Signed)
Addended by: Lerry Liner on: 12/28/2018 03:41 PM   Modules accepted: Orders

## 2018-12-28 NOTE — Telephone Encounter (Signed)
Future order entered for A1C per TA/thx dmf

## 2018-12-29 ENCOUNTER — Other Ambulatory Visit (INDEPENDENT_AMBULATORY_CARE_PROVIDER_SITE_OTHER): Payer: Commercial Managed Care - PPO

## 2018-12-29 DIAGNOSIS — E0865 Diabetes mellitus due to underlying condition with hyperglycemia: Secondary | ICD-10-CM | POA: Diagnosis not present

## 2018-12-29 LAB — HEMOGLOBIN A1C: Hgb A1c MFr Bld: 9.5 % — ABNORMAL HIGH (ref 4.6–6.5)

## 2019-01-04 ENCOUNTER — Encounter: Payer: Self-pay | Admitting: Family Medicine

## 2019-01-04 ENCOUNTER — Ambulatory Visit (INDEPENDENT_AMBULATORY_CARE_PROVIDER_SITE_OTHER): Payer: Commercial Managed Care - PPO | Admitting: Family Medicine

## 2019-01-04 ENCOUNTER — Ambulatory Visit: Payer: Commercial Managed Care - PPO | Admitting: Family Medicine

## 2019-01-04 VITALS — Temp 97.9°F | Wt 230.0 lb

## 2019-01-04 DIAGNOSIS — M5416 Radiculopathy, lumbar region: Secondary | ICD-10-CM

## 2019-01-04 DIAGNOSIS — E0865 Diabetes mellitus due to underlying condition with hyperglycemia: Secondary | ICD-10-CM

## 2019-01-04 MED ORDER — NORETHIN ACE-ETH ESTRAD-FE 1.5-30 MG-MCG PO TABS: 1.0000 | ORAL_TABLET | Freq: Every day | ORAL | 1 refills | Status: DC

## 2019-01-04 MED ORDER — GLIPIZIDE ER 10 MG PO TB24: 10.0000 mg | ORAL_TABLET | Freq: Every day | ORAL | 0 refills | Status: DC

## 2019-01-04 MED ORDER — METFORMIN HCL ER 500 MG PO TB24: ORAL_TABLET | ORAL | 1 refills | Status: DC

## 2019-01-04 NOTE — Progress Notes (Signed)
Virtual Visit via Video   Due to the COVID-19 pandemic, this visit was completed with telemedicine (audio/video) technology to reduce patient and provider exposure as well as to preserve personal protective equipment.   I connected with Hannah Foley by a video enabled telemedicine application and verified that I am speaking with the correct person using two identifiers. Location patient: Home Location provider: [facility], Office Persons participating in the virtual visit: Penni BombardEmily C Lucado, Yaviel Kloster, MD   I discussed the limitations of evaluation and management by telemedicine and the availability of in person appointments. The patient expressed understanding and agreed to proceed.  Care Team   Patient Care Team: Dianne DunAron, Lariza Cothron M, MD as PCP - General (Family Medicine)  Subjective:   HPI:   DM- Unfortunately, her a1c has increased to 9.5 from 8.1.  She has tried to be active but has found that to be difficult during the Pandemic.  She has lost 4 pounds.  She is currently taking Metformin XR 1000 mg daily and glipizide 5 mg daily with breakfast. Exercising 3 times per week.  Has not been checking FSBS.  Lab Results  Component Value Date   HGBA1C 9.5 (H) 12/29/2018   Back pain- This was addressed on 4.7.20 - presented with bilateral lumbar radiculopathy. She no longer has the numbness in her legs but still has to do a lot of stretching in the mornings prior to getting started. Finished prednisone dose pack which helped.   It goes away in the morning after she has moved around a bit.  Review of Systems  Constitutional: Negative.   HENT: Negative.   Respiratory: Negative.   Cardiovascular: Negative.   Gastrointestinal: Negative.   Genitourinary: Negative.   Musculoskeletal: Positive for back pain.  Skin: Negative.   Neurological: Negative.   Endo/Heme/Allergies: Negative.   Psychiatric/Behavioral: Negative.   All other systems reviewed and are negative.    Patient Active  Problem List   Diagnosis Date Noted  . Bilateral lumbar radiculopathy 11/30/2018  . Insomnia 06/09/2018  . Anxiety 06/09/2018  . Tobacco use disorder 08/03/2014  . High cholesterol 09/06/2013  . Morbid obesity (HCC) 06/14/2013  . Diabetes mellitus due to underlying condition, uncontrolled (HCC) 10/25/2010    Social History   Tobacco Use  . Smoking status: Current Every Day Smoker    Packs/day: 1.00    Types: Cigarettes  . Smokeless tobacco: Never Used  Substance Use Topics  . Alcohol use: Yes    Alcohol/week: 0.0 standard drinks    Comment: occasional    Current Outpatient Medications:  .  buPROPion (WELLBUTRIN SR) 150 MG 12 hr tablet, Take 1 tablet (150 mg total) by mouth 2 (two) times daily., Disp: 180 tablet, Rfl: 0 .  metFORMIN (GLUCOPHAGE-XR) 500 MG 24 hr tablet, Take 2qam, Disp: 180 tablet, Rfl: 1 .  norethindrone-ethinyl estradiol-iron (BLISOVI FE 1.5/30) 1.5-30 MG-MCG tablet, Take 1 tablet by mouth daily., Disp: 84 tablet, Rfl: 1 .  glipiZIDE (GLIPIZIDE XL) 10 MG 24 hr tablet, Take 1 tablet (10 mg total) by mouth daily with breakfast., Disp: 90 tablet, Rfl: 0  No Known Allergies  Objective:  Temp 97.9 F (36.6 C) (Oral)   Wt 230 lb (104.3 kg)   LMP 12/23/2018   BMI 33.00 kg/m   VITALS: Per patient if applicable, see vitals. GENERAL: Alert, appears well and in no acute distress. HEENT: Atraumatic, conjunctiva clear, no obvious abnormalities on inspection of external nose and ears. NECK: Normal movements of the head and  neck. CARDIOPULMONARY: No increased WOB. Speaking in clear sentences. I:E ratio WNL.  MS: Moves all visible extremities without noticeable abnormality. PSYCH: Pleasant and cooperative, well-groomed. Speech normal rate and rhythm. Affect is appropriate. Insight and judgement are appropriate. Attention is focused, linear, and appropriate.  NEURO: CN grossly intact. Oriented as arrived to appointment on time with no prompting. Moves both UE equally.   SKIN: No obvious lesions, wounds, erythema, or cyanosis noted on face or hands.  Depression screen [facility] 2/9 10/06/2018 08/26/2017  Decreased Interest 0 0  Down, Depressed, Hopeless 0 0  PHQ - 2 Score 0 0  Altered sleeping 0 -  Tired, decreased energy 0 -  Change in appetite 0 -  Feeling bad or failure about yourself  0 -  Trouble concentrating 0 -  Moving slowly or fidgety/restless 0 -  Suicidal thoughts 0 -  PHQ-9 Score 0 -  Difficult doing work/chores Not difficult at all -    Assessment and Plan:   [name] was seen today for diabetes and back pain.  Diagnoses and all orders for this visit:  Diabetes mellitus due to underlying condition, uncontrolled, with hyperglycemia (HCC)  Bilateral lumbar radiculopathy  Other orders -     norethindrone-ethinyl estradiol-iron (BLISOVI FE 1.5/30) 1.5-30 MG-MCG tablet; Take 1 tablet by mouth daily. -     glipiZIDE (GLIPIZIDE XL) 10 MG 24 hr tablet; Take 1 tablet (10 mg total) by mouth daily with breakfast. -     metFORMIN (GLUCOPHAGE-XR) 500 MG 24 hr tablet; Take 2qam    . COVID-19 Education: The signs and symptoms of COVID-19 were discussed with the patient and how to seek care for testing if needed. The importance of social distancing was discussed today. . Reviewed expectations re: course of current medical issues. . Discussed self-management of symptoms. . Outlined signs and symptoms indicating need for more acute intervention. . Patient verbalized understanding and all questions were answered. Marland Kitchen Health Maintenance issues including appropriate healthy diet, exercise, and smoking avoidance were discussed with patient. . See orders for this visit as documented in the electronic medical record.  Ruthe Mannan, MD  Records requested if needed. Time spent: 25 minutes, of which >50% was spent in obtaining information about her symptoms, reviewing her previous labs, evaluations, and treatments, counseling her about her condition (please see the  discussed topics above), and developing a plan to further investigate it; she had a number of questions which I addressed.

## 2019-01-04 NOTE — Assessment & Plan Note (Signed)
Radiculopathy has resolved so I do not want to repeat course of prednisone- but back still hurts in the morning.  Has already tried changing her mattress.  Advised adding 1 ES tylenol twice daily for 2 weeks and she will update me.  Also she will call to schedule a DM follow up in 3 months. The patient indicates understanding of these issues and agrees with the plan.

## 2019-01-04 NOTE — Assessment & Plan Note (Signed)
>  25 minutes spent in face to face time with patient, >50% spent in counselling or coordination of care discussing DM and back pain.    DM has deteriorated.  Will increase glipizide to Glipizide 10 mg XL daily, continue current dose of Metformin.

## 2019-02-24 ENCOUNTER — Other Ambulatory Visit: Payer: Self-pay | Admitting: Family Medicine

## 2019-04-03 ENCOUNTER — Other Ambulatory Visit: Payer: Self-pay | Admitting: Family Medicine

## 2019-04-05 ENCOUNTER — Encounter: Payer: Self-pay | Admitting: Family Medicine

## 2019-06-07 ENCOUNTER — Ambulatory Visit: Payer: Commercial Managed Care - PPO | Admitting: Family Medicine

## 2019-06-07 ENCOUNTER — Encounter: Payer: Self-pay | Admitting: Family Medicine

## 2019-06-07 ENCOUNTER — Other Ambulatory Visit: Payer: Self-pay

## 2019-06-07 DIAGNOSIS — L729 Follicular cyst of the skin and subcutaneous tissue, unspecified: Secondary | ICD-10-CM | POA: Diagnosis not present

## 2019-06-07 DIAGNOSIS — L089 Local infection of the skin and subcutaneous tissue, unspecified: Secondary | ICD-10-CM

## 2019-06-07 MED ORDER — FLUCONAZOLE 150 MG PO TABS: 150.0000 mg | ORAL_TABLET | Freq: Once | ORAL | 0 refills | Status: AC

## 2019-06-07 MED ORDER — DOXYCYCLINE HYCLATE 100 MG PO TABS: 100.0000 mg | ORAL_TABLET | Freq: Two times a day (BID) | ORAL | 0 refills | Status: DC

## 2019-06-07 NOTE — Progress Notes (Signed)
Hannah Foley - 38 y.o. female MRN [id number]  Date of birth: 05-Jun-1981  Subjective Chief Complaint  Patient presents with  . Headache    Pt have a lump in her hea on the lower right side right behind the ear. Pt felt it this morning and its painful.    HPI Hannah Foley is a 38 y.o. female with history of T2DM here today with painful knot behind R ear.  She noticed this morning while in the shower.  Has history of infected skin cysts and this feels similar.  She denies any drainage from area.  She has not had ear pain, fever, chills.  She reports that blood sugars have been pretty well controlled at home.   ROS:  A comprehensive ROS was completed and negative except as noted per HPI  No Known Allergies  Past Medical History:  Diagnosis Date  . Diabetes mellitus    type 2  . Low back pain 03/12/2011    Past Surgical History:  Procedure Laterality Date  . CHOLECYSTECTOMY    . VAGINAL HYSTERECTOMY     laproscopy, hyst for endometriosis    Social History   Socioeconomic History  . Marital status: Single    Spouse name: Not on file  . Number of children: 0  . Years of education: Not on file  . Highest education level: Not on file  Occupational History  . Occupation: Education officer, museum, Theme park manager care    Employer: [employer]  . Financial resource strain: Not on file  . Food insecurity    Worry: Not on file    Inability: Not on file  . Transportation needs    Medical: Not on file    Non-medical: Not on file  Tobacco Use  . Smoking status: Current Every Day Smoker    Packs/day: 1.00    Types: Cigarettes  . Smokeless tobacco: Never Used  Substance and Sexual Activity  . Alcohol use: Yes    Alcohol/week: 0.0 standard drinks    Comment: occasional  . Drug use: No  . Sexual activity: Not on file  Lifestyle  . Physical activity    Days per week: Not on file    Minutes per session: Not on file  . Stress: Not on file  Relationships  . Social  Herbalist on phone: Not on file    Gets together: Not on file    Attends religious service: Not on file    Active member of club or organization: Not on file    Attends meetings of clubs or organizations: Not on file    Relationship status: Not on file  Other Topics Concern  . Not on file  Social History Narrative  . Not on file    Family History  Problem Relation Age of Onset  . Cancer Paternal Aunt 12       breast  . Cancer Other        breast    Health Maintenance  Topic Date Due  . OPHTHALMOLOGY EXAM  10/23/2017  . URINE MICROALBUMIN  03/10/2019  . HEMOGLOBIN A1C  07/01/2019  . PAP SMEAR-Modifier  08/13/2019  . FOOT EXAM  10/07/2019  . TETANUS/TDAP  08/27/2023  . INFLUENZA VACCINE  Completed  . PNEUMOCOCCAL POLYSACCHARIDE VACCINE AGE 74-64 HIGH RISK  Completed  . HIV Screening  Completed    ----------------------------------------------------------------------------------------------------------------------------------------------------------------------------------------------------------------- Physical Exam BP 110/80   Pulse 82   Temp 98.3 F (36.8 C) (Oral)  Ht 5\' 10"  (1.778 m)   Wt 224 lb 9.6 oz (101.9 kg)   SpO2 98%   BMI 32.23 kg/m   Physical Exam Constitutional:      Appearance: She is well-developed.  HENT:     Head: Normocephalic and atraumatic.     Right Ear: Tympanic membrane and ear canal normal.     Left Ear: Tympanic membrane and ear canal normal.     Ears:     Comments: Tender, firm nodule posterior to R ear along scalp.  There is erythema without fluctuance.      Mouth/Throat:     Mouth: Mucous membranes are moist.  Eyes:     General: No scleral icterus. Neck:     Musculoskeletal: Neck supple.  Neurological:     Mental Status: She is alert.  Psychiatric:        Mood and Affect: Mood normal.        Behavior: Behavior normal.      ------------------------------------------------------------------------------------------------------------------------------------------------------------------------------------------------------------------- Assessment and Plan  Infected cyst of skin -Area firm without fluctuance.  I don't think I&D would be beneficial at this time.  -Start doxycycline 100mg  BID with warm compresses.   -Diflucan 150mg  x1 sent in as she reports frequent yeast infections with antibiotics.  -Instructed to call for any new or worsening symptoms.

## 2019-06-07 NOTE — Assessment & Plan Note (Signed)
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  I don't think I&D would be beneficial at this time.  -Start doxycycline 100mg  BID with warm compresses.   -Diflucan 150mg  x1 sent in as she reports frequent yeast infections with antibiotics.  -Instructed to call for any new or worsening symptoms.

## 2019-06-07 NOTE — Patient Instructions (Signed)
Take doxycycline until course is completed.  If you feel like symptoms are worsening or you develop new symptoms please let us know.

## 2019-06-21 ENCOUNTER — Other Ambulatory Visit: Payer: Self-pay

## 2019-06-21 MED ORDER — NORETHIN ACE-ETH ESTRAD-FE 1.5-30 MG-MCG PO TABS: 1.0000 | ORAL_TABLET | Freq: Every day | ORAL | 3 refills | Status: DC

## 2019-07-05 ENCOUNTER — Other Ambulatory Visit: Payer: Self-pay

## 2019-07-05 MED ORDER — NORETHIN ACE-ETH ESTRAD-FE 1.5-30 MG-MCG PO TABS: 1.0000 | ORAL_TABLET | Freq: Every day | ORAL | 3 refills | Status: DC

## 2019-07-05 NOTE — Telephone Encounter (Signed)
Last OV 01/04/19 Last fill 06/21/19  #84/3

## 2019-07-07 ENCOUNTER — Encounter: Payer: Self-pay | Admitting: Family Medicine

## 2019-07-07 ENCOUNTER — Other Ambulatory Visit: Payer: Self-pay | Admitting: Family Medicine

## 2019-07-08 ENCOUNTER — Other Ambulatory Visit: Payer: Self-pay

## 2019-07-08 MED ORDER — BUPROPION HCL ER (SR) 150 MG PO TB12: ORAL_TABLET | ORAL | 2 refills | Status: DC

## 2019-07-08 MED ORDER — GLIPIZIDE ER 10 MG PO TB24: ORAL_TABLET | ORAL | 0 refills | Status: DC

## 2019-07-08 MED ORDER — METFORMIN HCL ER 500 MG PO TB24: ORAL_TABLET | ORAL | 1 refills | Status: DC

## 2019-09-19 ENCOUNTER — Telehealth: Payer: Self-pay | Admitting: Family Medicine

## 2019-09-19 NOTE — Telephone Encounter (Signed)
Please help the pt schedule TOC with [facility].

## 2019-09-19 NOTE — Telephone Encounter (Signed)
Patient is calling and wanted to [facility] from Dr. Dayton Martes to Alysia Penna due to Dr. Dayton Martes leaving he practice. Pls advise. CB [phone]

## 2019-09-19 NOTE — Telephone Encounter (Signed)
Ok with me 

## 2019-09-19 NOTE — Telephone Encounter (Signed)
Hannah Foley please advise, is this ok?

## 2019-09-20 NOTE — Telephone Encounter (Signed)
I called and scheduled TOC appointment with patient.

## 2019-10-10 ENCOUNTER — Other Ambulatory Visit: Payer: Self-pay

## 2019-10-11 ENCOUNTER — Encounter: Payer: Self-pay | Admitting: Nurse Practitioner

## 2019-10-11 ENCOUNTER — Other Ambulatory Visit (HOSPITAL_COMMUNITY)
Admission: RE | Admit: 2019-10-11 | Discharge: 2019-10-11 | Disposition: A | Discharge status: Home / Self Care | Payer: Commercial Managed Care - PPO | Source: Ambulatory Visit | Attending: Nurse Practitioner | Admitting: Nurse Practitioner

## 2019-10-11 ENCOUNTER — Ambulatory Visit (INDEPENDENT_AMBULATORY_CARE_PROVIDER_SITE_OTHER): Payer: Commercial Managed Care - PPO | Admitting: Nurse Practitioner

## 2019-10-11 VITALS — BP 122/82 | HR 97 | Temp 97.6°F | Ht 71.0 in | Wt 228.8 lb

## 2019-10-11 DIAGNOSIS — Z124 Encounter for screening for malignant neoplasm of cervix: Secondary | ICD-10-CM | POA: Diagnosis present

## 2019-10-11 DIAGNOSIS — A630 Anogenital (venereal) warts: Secondary | ICD-10-CM

## 2019-10-11 DIAGNOSIS — E782 Mixed hyperlipidemia: Secondary | ICD-10-CM

## 2019-10-11 DIAGNOSIS — F17200 Nicotine dependence, unspecified, uncomplicated: Secondary | ICD-10-CM

## 2019-10-11 DIAGNOSIS — Z113 Encounter for screening for infections with a predominantly sexual mode of transmission: Secondary | ICD-10-CM

## 2019-10-11 DIAGNOSIS — Z1231 Encounter for screening mammogram for malignant neoplasm of breast: Secondary | ICD-10-CM

## 2019-10-11 DIAGNOSIS — Z803 Family history of malignant neoplasm of breast: Secondary | ICD-10-CM

## 2019-10-11 DIAGNOSIS — Z0001 Encounter for general adult medical examination with abnormal findings: Secondary | ICD-10-CM | POA: Insufficient documentation

## 2019-10-11 DIAGNOSIS — E1169 Type 2 diabetes mellitus with other specified complication: Secondary | ICD-10-CM | POA: Diagnosis not present

## 2019-10-11 DIAGNOSIS — R87612 Low grade squamous intraepithelial lesion on cytologic smear of cervix (LGSIL): Secondary | ICD-10-CM

## 2019-10-11 LAB — COMPREHENSIVE METABOLIC PANEL
ALT: 19 U/L (ref 0–35)
AST: 14 U/L (ref 0–37)
Albumin: 4.4 g/dL (ref 3.5–5.2)
Alkaline Phosphatase: 69 U/L (ref 39–117)
BUN: 12 mg/dL (ref 6–23)
CO2: 28 mEq/L (ref 19–32)
Calcium: 9.6 mg/dL (ref 8.4–10.5)
Chloride: 99 mEq/L (ref 96–112)
Creatinine, Ser: 0.82 mg/dL (ref 0.40–1.20)
GFR: 77.01 mL/min (ref >60.00)
Glucose, Bld: 222 mg/dL — ABNORMAL HIGH (ref 70–99)
Potassium: 4.6 mEq/L (ref 3.5–5.1)
Sodium: 135 mEq/L (ref 135–145)
Total Bilirubin: 0.5 mg/dL (ref 0.2–1.2)
Total Protein: 7.3 g/dL (ref 6.0–8.3)

## 2019-10-11 LAB — LIPID PANEL
Cholesterol: 208 mg/dL — ABNORMAL HIGH (ref 0–200)
HDL: 44.6 mg/dL (ref >39.00)
LDL Cholesterol: 130 mg/dL — ABNORMAL HIGH (ref 0–99)
NonHDL: 163.53
Total CHOL/HDL Ratio: 5
Triglycerides: 167 mg/dL — ABNORMAL HIGH (ref 0.0–149.0)
VLDL: 33.4 mg/dL (ref 0.0–40.0)

## 2019-10-11 LAB — CBC
HCT: 47.2 % — ABNORMAL HIGH (ref 36.0–46.0)
Hemoglobin: 15.6 g/dL — ABNORMAL HIGH (ref 12.0–15.0)
MCHC: 33.1 g/dL (ref 30.0–36.0)
MCV: 84.4 fl (ref 78.0–100.0)
Platelets: 382 10*3/uL (ref 150.0–400.0)
RBC: 5.59 Mil/uL — ABNORMAL HIGH (ref 3.87–5.11)
RDW: 13.9 % (ref 11.5–15.5)
WBC: 12.9 10*3/uL — ABNORMAL HIGH (ref 4.0–10.5)

## 2019-10-11 LAB — MICROALBUMIN / CREATININE URINE RATIO
Creatinine,U: 237.7 mg/dL
Microalb Creat Ratio: 0.8 mg/g (ref 0.0–30.0)
Microalb, Ur: 2 mg/dL — ABNORMAL HIGH (ref 0.0–1.9)

## 2019-10-11 LAB — HEMOGLOBIN A1C: Hgb A1c MFr Bld: 9.9 % — ABNORMAL HIGH (ref 4.6–6.5)

## 2019-10-11 MED ORDER — PODOFILOX 0.5 % EX GEL: Freq: Two times a day (BID) | CUTANEOUS | 0 refills | Status: DC

## 2019-10-11 NOTE — Patient Instructions (Addendum)
Stable cbc with persistent elevation in WBC, Hgb and Hct. Uncontrolled DM with hgbA1c of 9.9 and positive urine microalbulmin. Abnormal lipid panel: elevated LDL, Trig and TC. Normal renal and liver function. Negative HIV, syphillis, Gc/chlamydia, trichomoniasis, BV and yeast. Pending PAP smear and Hep. C I recommend the following medication changes: decrease glipizide to 5mg  and increase metformin to 850mg  BID. Start lisinopril 2.5mg  for renal protection. F/up in 61months  You will be contacted to schedule appt for mammogram.  Have diabetic eye exam report faxed to me.  Health Maintenance, Female Adopting a healthy lifestyle and getting preventive care are important in promoting health and wellness. Ask your health care provider about:  The right schedule for you to have regular tests and exams.  Things you can do on your own to prevent diseases and keep yourself healthy. What should I know about diet, weight, and exercise? Eat a healthy diet   Eat a diet that includes plenty of vegetables, fruits, low-fat dairy products, and lean protein.  Do not eat a lot of foods that are high in solid fats, added sugars, or sodium. Maintain a healthy weight Body mass index (BMI) is used to identify weight problems. It estimates body fat based on height and weight. Your health care provider can help determine your BMI and help you achieve or maintain a healthy weight. Get regular exercise Get regular exercise. This is one of the most important things you can do for your health. Most adults should:  Exercise for at least 150 minutes each week. The exercise should increase your heart rate and make you sweat (moderate-intensity exercise).  Do strengthening exercises at least twice a week. This is in addition to the moderate-intensity exercise.  Spend less time sitting. Even light physical activity can be beneficial. Watch cholesterol and blood lipids Have your blood tested for lipids and  cholesterol at 41 years of age, then have this test every 5 years. Have your cholesterol levels checked more often if:  Your lipid or cholesterol levels are high.  You are older than 41 years of age.  You are at high risk for heart disease. What should I know about cancer screening? Depending on your health history and family history, you may need to have cancer screening at various ages. This may include screening for:  Breast cancer.  Cervical cancer.  Colorectal cancer.  Skin cancer.  Lung cancer. What should I know about heart disease, diabetes, and high blood pressure? Blood pressure and heart disease  High blood pressure causes heart disease and increases the risk of stroke. This is more likely to develop in people who have high blood pressure readings, are of African descent, or are overweight.  Have your blood pressure checked: ? Every 3-5 years if you are 37-34 years of age. ? Every year if you are 30 years old or older. Diabetes Have regular diabetes screenings. This checks your fasting blood sugar level. Have the screening done:  Once every three years after age 27 if you are at a normal weight and have a low risk for diabetes.  More often and at a younger age if you are overweight or have a high risk for diabetes. What should I know about preventing infection? Hepatitis B If you have a higher risk for hepatitis B, you should be screened for this virus. Talk with your health care provider to find out if you are at risk for hepatitis B infection. Hepatitis C Testing is recommended for:  Everyone born from  1947 through 49.  Anyone with known risk factors for hepatitis C. Sexually transmitted infections (STIs)  Get screened for STIs, including gonorrhea and chlamydia, if: ? You are sexually active and are younger than 41 years of age. ? You are older than 41 years of age and your health care provider tells you that you are at risk for this type of  infection. ? Your sexual activity has changed since you were last screened, and you are at increased risk for chlamydia or gonorrhea. Ask your health care provider if you are at risk.  Ask your health care provider about whether you are at high risk for HIV. Your health care provider may recommend a prescription medicine to help prevent HIV infection. If you choose to take medicine to prevent HIV, you should first get tested for HIV. You should then be tested every 3 months for as long as you are taking the medicine. Pregnancy  If you are about to stop having your period (premenopausal) and you may become pregnant, seek counseling before you get pregnant.  Take 400 to 800 micrograms (mcg) of folic acid every day if you become pregnant.  Ask for birth control (contraception) if you want to prevent pregnancy. Osteoporosis and menopause Osteoporosis is a disease in which the bones lose minerals and strength with aging. This can result in bone fractures. If you are 67 years old or older, or if you are at risk for osteoporosis and fractures, ask your health care provider if you should:  Be screened for bone loss.  Take a calcium or vitamin D supplement to lower your risk of fractures.  Be given hormone replacement therapy (HRT) to treat symptoms of menopause. Follow these instructions at home: Lifestyle  Do not use any products that contain nicotine or tobacco, such as cigarettes, e-cigarettes, and chewing tobacco. If you need help quitting, ask your health care provider.  Do not use street drugs.  Do not share needles.  Ask your health care provider for help if you need support or information about quitting drugs. Alcohol use  Do not drink alcohol if: ? Your health care provider tells you not to drink. ? You are pregnant, may be pregnant, or are planning to become pregnant.  If you drink alcohol: ? Limit how much you use to 0-1 drink a day. ? Limit intake if you are  breastfeeding.  Be aware of how much alcohol is in your drink. In the [place] one drink equals one 12 oz bottle of beer (355 mL), one 5 oz glass of wine (148 mL), or one 1 oz glass of hard liquor (44 mL). General instructions  Schedule regular health, dental, and eye exams.  Stay current with your vaccines.  Tell your health care provider if: ? You often feel depressed. ? You have ever been abused or do not feel safe at home. Summary  Adopting a healthy lifestyle and getting preventive care are important in promoting health and wellness.  Follow your health care provider's instructions about healthy diet, exercising, and getting tested or screened for diseases.  Follow your health care provider's instructions on monitoring your cholesterol and blood pressure. This information is not intended to replace advice given to you by your health care provider. Make sure you discuss any questions you have with your health care provider. Document Revised: 08/04/2018 Document Reviewed: 08/04/2018 Elsevier Patient Education  2020 [employer].

## 2019-10-11 NOTE — Assessment & Plan Note (Signed)
maternal aunt at age 41 and mother at age 52. reports she had genetic test done at [facility] 10/2004. Need to request report Mammogram ordered today Long term use of OCP with estrogen No tobacco use.

## 2019-10-11 NOTE — Progress Notes (Addendum)
Subjective:    Patient ID: Hannah Foley, female    DOB: Oct 14, 1980, 39 y.o.   MRN: [id number]  Patient presents today for complete physical and eval of chronic conditions.  HPI DM: uncontrolled with glipizide and metformin No glucose check at home Last hgbA1c of 9.5 Denies any neuropathy Negative urine microalbumin Up to date with eye exam LDL not at goal.  FHx of Breast cancer: maternal aunt at age 29 and mother at age 43. reports she had genetic test done at [facility] 10/2004. She states she was BRCA 1 and 2 negative  Sexual History (orientation,birth control, marital status, STD):single, sexually active with female partner, requesting for STD screen today.  Depression/Suicide: Depression screen [facility] 2/9 10/11/2019 10/06/2018 08/26/2017  Decreased Interest 0 0 0  Down, Depressed, Hopeless 0 0 0  PHQ - 2 Score 0 0 0  Altered sleeping - 0 -  Tired, decreased energy - 0 -  Change in appetite - 0 -  Feeling bad or failure about yourself  - 0 -  Trouble concentrating - 0 -  Moving slowly or fidgety/restless - 0 -  Suicidal thoughts - 0 -  PHQ-9 Score - 0 -  Difficult doing work/chores - Not difficult at all -   Vision:up to date  Dental:up to date  Immunizations: (TDAP, Hep C screen, Pneumovax, Influenza, zoster)  Health Maintenance  Topic Date Due  . Eye exam for diabetics  10/23/2017  . Hemoglobin A1C  04/09/2020  . Complete foot exam   10/10/2020  . Pap Smear  10/10/2022  . Tetanus Vaccine  08/27/2023  . Flu Shot  Completed  . Pneumococcal vaccine  Completed  . HIV Screening  Completed   Diet:regular.  Weight:  Wt Readings from Last 3 Encounters:  10/11/19 228 lb 12.8 oz (103.8 kg)  06/07/19 224 lb 9.6 oz (101.9 kg)  01/04/19 230 lb (104.3 kg)    Exercise:none  Fall Risk: Fall Risk  10/11/2019 10/06/2018 08/26/2017  Falls in the past year? 0 0 No  Follow up - Falls evaluation completed -   Medications and allergies reviewed with patient and  updated if appropriate.  Patient Active Problem List   Diagnosis Date Noted  . Genital warts 10/13/2019  . Leukocytosis 10/12/2019  . Family history of breast cancer in first degree relative 10/11/2019  . Bilateral lumbar radiculopathy 11/30/2018  . Insomnia 06/09/2018  . Anxiety 06/09/2018  . Infected cyst of skin 10/13/2014  . Tobacco use disorder 08/03/2014  . High cholesterol 09/06/2013  . Morbid obesity ([place]) 06/14/2013  . Diabetes mellitus due to underlying condition, uncontrolled ([place]) 10/25/2010    Current Outpatient Medications on File Prior to Visit  Medication Sig Dispense Refill  . buPROPion (WELLBUTRIN SR) 150 MG 12 hr tablet TAKE 1 TABLET(150 MG) BY MOUTH TWICE DAILY 180 tablet 2  . norethindrone-ethinyl estradiol-iron (BLISOVI FE 1.5/30) 1.5-30 MG-MCG tablet Take 1 tablet by mouth daily. 84 tablet 3   No current facility-administered medications on file prior to visit.    Past Medical History:  Diagnosis Date  . Diabetes mellitus    type 2  . Low back pain 03/12/2011    Past Surgical History:  Procedure Laterality Date  . CHOLECYSTECTOMY    . VAGINAL HYSTERECTOMY     laproscopy, hyst for endometriosis    Social History   Socioeconomic History  . Marital status: Single    Spouse name: Not on file  . Number of children: 0  . Years of  education: Not on file  . Highest education level: Not on file  Occupational History  . Occupation: Education officer, museum, Theme park manager care    Employer: [employer]  Tobacco Use  . Smoking status: Current Every Day Smoker    Packs/day: 1.00    Types: Cigarettes  . Smokeless tobacco: Never Used  Substance and Sexual Activity  . Alcohol use: Yes    Alcohol/week: 0.0 standard drinks    Comment: occasional  . Drug use: No  . Sexual activity: Not Currently    Birth control/protection: Pill  Other Topics Concern  . Not on file  Social History Narrative  . Not on file   Social Determinants of Health   Financial  Resource Strain:   . Difficulty of Paying Living Expenses: Not on file  Food Insecurity:   . Worried About Charity fundraiser in the Last Year: Not on file  . Ran Out of Food in the Last Year: Not on file  Transportation Needs:   . Lack of Transportation (Medical): Not on file  . Lack of Transportation (Non-Medical): Not on file  Physical Activity:   . Days of Exercise per Week: Not on file  . Minutes of Exercise per Session: Not on file  Stress:   . Feeling of Stress : Not on file  Social Connections:   . Frequency of Communication with Friends and Family: Not on file  . Frequency of Social Gatherings with Friends and Family: Not on file  . Attends Religious Services: Not on file  . Active Member of Clubs or Organizations: Not on file  . Attends Archivist Meetings: Not on file  . Marital Status: Not on file    Family History  Problem Relation Age of Onset  . Cancer Paternal Aunt 15       breast  . Cancer Other        breast        Review of Systems  Constitutional: Negative for fever, malaise/fatigue and weight loss.  HENT: Negative for congestion and sore throat.   Eyes:       Negative for visual changes  Respiratory: Negative for cough and shortness of breath.   Cardiovascular: Negative for chest pain, palpitations and leg swelling.  Gastrointestinal: Negative for blood in stool, constipation, diarrhea and heartburn.  Genitourinary: Negative for dysuria, frequency and urgency.  Musculoskeletal: Negative for falls, joint pain and myalgias.  Skin: Negative for rash.  Neurological: Negative for dizziness, sensory change and headaches.  Endo/Heme/Allergies: Does not bruise/bleed easily.  Psychiatric/Behavioral: Negative for depression, substance abuse and suicidal ideas. The patient is not nervous/anxious.     Objective:   Vitals:   10/11/19 0827  BP: 122/82  Pulse: 97  Temp: 97.6 F (36.4 C)  SpO2: 97%    Body mass index is 31.91  kg/m.   Physical Examination:  Physical Exam Vitals reviewed. Exam conducted with a chaperone present.  Constitutional:      General: She is not in acute distress.    Appearance: She is well-developed. She is obese.  HENT:     Right Ear: Tympanic membrane, ear canal and external ear normal.     Left Ear: Tympanic membrane, ear canal and external ear normal.  Eyes:     Extraocular Movements: Extraocular movements intact.     Conjunctiva/sclera: Conjunctivae normal.     Pupils: Pupils are equal, round, and reactive to light.  Cardiovascular:     Rate and Rhythm: Normal rate and  regular rhythm.     Pulses: Normal pulses.     Heart sounds: Normal heart sounds.  Pulmonary:     Effort: Pulmonary effort is normal. No respiratory distress.     Breath sounds: Normal breath sounds.  Chest:     Chest wall: No tenderness.     Breasts:        Right: Normal.        Left: Normal.  Abdominal:     General: Bowel sounds are normal.     Palpations: Abdomen is soft.  Genitourinary:    Labia:        Right: No rash.        Left: No rash.      Vagina: Vaginal discharge and erythema present.     Cervix: Friability and erythema present.     Adnexa: Right adnexa normal and left adnexa normal.    Musculoskeletal:        General: Normal range of motion.     Right lower leg: No edema.     Left lower leg: No edema.  Lymphadenopathy:     Upper Body:     Right upper body: No supraclavicular, axillary or pectoral adenopathy.     Left upper body: No supraclavicular, axillary or pectoral adenopathy.     Lower Body: No right inguinal adenopathy. No left inguinal adenopathy.  Skin:    General: Skin is warm and dry.     Comments: Normal DM foot exam  Neurological:     Mental Status: She is alert and oriented to person, place, and time.     Sensory: No sensory deficit.     Deep Tendon Reflexes: Reflexes are normal and symmetric.    ASSESSMENT and PLAN: This visit occurred during the SARS-CoV-2  public health emergency.  Safety protocols were in place, including screening questions prior to the visit, additional usage of staff PPE, and extensive cleaning of exam room while observing appropriate contact time as indicated for disinfecting solutions.   Lailana was seen today for annual exam.  Diagnoses and all orders for this visit:  Encounter for preventative adult health care exam with abnormal findings -     CBC -     Cytology - PAP( [place]) -     MM DIGITAL SCREENING BILATERAL; Future  Type 2 diabetes mellitus with other specified complication, without long-term current use of insulin (HCC) -     Hemoglobin A1c -     Microalbumin / creatinine urine ratio -     Comprehensive metabolic panel -     glipiZIDE (GLUCOTROL XL) 5 MG 24 hr tablet; Take 1 tablet (5 mg total) by mouth daily with breakfast. -     metFORMIN (GLUCOPHAGE) 850 MG tablet; Take 1 tablet (850 mg total) by mouth 2 (two) times daily with a meal. -     lisinopril (ZESTRIL) 5 MG tablet; Take 0.5 tablets (2.5 mg total) by mouth daily.  Mixed hyperlipidemia -     Comprehensive metabolic panel -     Lipid panel -     atorvastatin (LIPITOR) 20 MG tablet; Take 1 tablet (20 mg total) by mouth daily at 6 PM.  Screen for STD (sexually transmitted disease) -     HIV antibody (with reflex) -     Hepatitis C Antibody -     RPR -     Cervicovaginal ancillary only( [place])  Genital warts -     Cervicovaginal ancillary only( [place]) -  Discontinue: podofilox (CONDYLOX) 0.5 % gel; Apply topically 2 (two) times daily. Use for 3days -     Ambulatory referral to Gynecology  Encounter for Papanicolaou smear for cervical cancer screening -     Cytology - PAP( [place])  Breast cancer screening by mammogram -     MM DIGITAL SCREENING BILATERAL; Future  Family history of breast cancer in first degree relative  Tobacco use disorder  Low grade squamous intraepith lesion on cytologic smear cervix (lgsil) -      Ambulatory referral to Gynecology  HPV (human papilloma virus) anogenital infection -     Ambulatory referral to Gynecology    Family history of breast cancer in first degree relative maternal aunt at age 94 and mother at age 88. reports she had genetic test done at [facility] 10/2004. Need to request report Mammogram ordered today Long term use of OCP with estrogen No tobacco use.  Tobacco use disorder 1ppd, advised about her risk for PE and DVT with OCP use Advised about need for tobacco cessation      Problem List Items Addressed This Visit      Endocrine   Diabetes mellitus due to underlying condition, uncontrolled (HCC)   Relevant Medications   glipiZIDE (GLUCOTROL XL) 5 MG 24 hr tablet   metFORMIN (GLUCOPHAGE) 850 MG tablet   lisinopril (ZESTRIL) 5 MG tablet   atorvastatin (LIPITOR) 20 MG tablet     Musculoskeletal and Integument   Genital warts   Relevant Orders   Cervicovaginal ancillary only( [place]) (Completed)   Ambulatory referral to Gynecology     Other   Family history of breast cancer in first degree relative    maternal aunt at age 68 and mother at age 71. reports she had genetic test done at [facility] 10/2004. Need to request report Mammogram ordered today Long term use of OCP with estrogen No tobacco use.      High cholesterol   Relevant Medications   lisinopril (ZESTRIL) 5 MG tablet   atorvastatin (LIPITOR) 20 MG tablet   Tobacco use disorder    1ppd, advised about her risk for PE and DVT with OCP use Advised about need for tobacco cessation       Other Visit Diagnoses    Encounter for preventative adult health care exam with abnormal findings    -  Primary   Relevant Orders   CBC (Completed)   Cytology - PAP( [place]) (Completed)   MM DIGITAL SCREENING BILATERAL   Screen for STD (sexually transmitted disease)       Relevant Orders   HIV antibody (with reflex) (Completed)   Hepatitis C Antibody  (Completed)   RPR (Completed)   Cervicovaginal ancillary only( [place]) (Completed)   Encounter for Papanicolaou smear for cervical cancer screening       Relevant Orders   Cytology - PAP( ) (Completed)   Breast cancer screening by mammogram       Relevant Orders   MM DIGITAL SCREENING BILATERAL   Low grade squamous intraepith lesion on cytologic smear cervix (lgsil)       Relevant Orders   Ambulatory referral to Gynecology   HPV (human papilloma virus) anogenital infection       Relevant Orders   Ambulatory referral to Gynecology       Follow up: Return in about 3 months (around 01/08/2020) for DM and , hyperlipidemia (video or F2F).  Wilfred Lacy, NP

## 2019-10-12 DIAGNOSIS — D72829 Elevated white blood cell count, unspecified: Secondary | ICD-10-CM | POA: Insufficient documentation

## 2019-10-12 LAB — CERVICOVAGINAL ANCILLARY ONLY
Bacterial Vaginitis (gardnerella): NEGATIVE
Candida Glabrata: NEGATIVE
Candida Vaginitis: NEGATIVE
Chlamydia: NEGATIVE
Comment: NEGATIVE
Comment: NEGATIVE
Comment: NEGATIVE
Comment: NEGATIVE
Comment: NEGATIVE
Comment: NORMAL
Neisseria Gonorrhea: NEGATIVE
Trichomonas: NEGATIVE

## 2019-10-12 LAB — HIV ANTIBODY (ROUTINE TESTING W REFLEX): HIV 1&2 Ab, 4th Generation: NONREACTIVE

## 2019-10-12 LAB — HEPATITIS C ANTIBODY
Hepatitis C Ab: NONREACTIVE
SIGNAL TO CUT-OFF: 0.12 (ref <1.00)

## 2019-10-12 LAB — RPR: RPR Ser Ql: NONREACTIVE

## 2019-10-12 MED ORDER — ATORVASTATIN CALCIUM 20 MG PO TABS: 20.0000 mg | ORAL_TABLET | Freq: Every day | ORAL | 1 refills | Status: DC

## 2019-10-12 MED ORDER — LISINOPRIL 5 MG PO TABS: 2.5000 mg | ORAL_TABLET | Freq: Every day | ORAL | 1 refills | Status: DC

## 2019-10-12 MED ORDER — METFORMIN HCL 850 MG PO TABS: 850.0000 mg | ORAL_TABLET | Freq: Two times a day (BID) | ORAL | 1 refills | Status: DC

## 2019-10-12 MED ORDER — GLIPIZIDE ER 5 MG PO TB24: 5.0000 mg | ORAL_TABLET | Freq: Every day | ORAL | 1 refills | Status: DC

## 2019-10-13 ENCOUNTER — Encounter: Payer: Self-pay | Admitting: Nurse Practitioner

## 2019-10-13 DIAGNOSIS — A630 Anogenital (venereal) warts: Secondary | ICD-10-CM | POA: Insufficient documentation

## 2019-10-13 MED ORDER — IMIQUIMOD 5 % EX CREA: TOPICAL_CREAM | CUTANEOUS | 1 refills | Status: DC

## 2019-10-13 NOTE — Assessment & Plan Note (Signed)
1ppd, advised about her risk for PE and DVT with OCP use Advised about need for tobacco cessation

## 2019-10-19 ENCOUNTER — Encounter: Payer: Self-pay | Admitting: Nurse Practitioner

## 2019-10-28 DIAGNOSIS — R87612 Low grade squamous intraepithelial lesion on cytologic smear of cervix (LGSIL): Secondary | ICD-10-CM | POA: Insufficient documentation

## 2019-10-28 LAB — CYTOLOGY - PAP
Comment: NEGATIVE
Comment: NEGATIVE
HSV1: NEGATIVE
HSV2: NEGATIVE
High risk HPV: POSITIVE — AB

## 2019-10-28 NOTE — Addendum Note (Signed)
Addended by: Alysia Penna L on: 10/28/2019 02:30 PM   Modules accepted: Orders

## 2019-10-31 ENCOUNTER — Encounter: Payer: Self-pay | Admitting: Nurse Practitioner

## 2019-11-11 ENCOUNTER — Ambulatory Visit
Admission: RE | Admit: 2019-11-11 | Discharge: 2019-11-11 | Disposition: A | Discharge status: Home / Self Care | Payer: Commercial Managed Care - PPO | Source: Ambulatory Visit | Attending: Nurse Practitioner | Admitting: Nurse Practitioner

## 2019-11-11 ENCOUNTER — Other Ambulatory Visit: Payer: Self-pay

## 2019-11-11 ENCOUNTER — Ambulatory Visit: Payer: Commercial Managed Care - PPO

## 2019-11-11 DIAGNOSIS — Z1231 Encounter for screening mammogram for malignant neoplasm of breast: Secondary | ICD-10-CM

## 2019-11-11 DIAGNOSIS — Z0001 Encounter for general adult medical examination with abnormal findings: Secondary | ICD-10-CM

## 2019-11-24 ENCOUNTER — Encounter: Payer: Self-pay | Admitting: *Deleted

## 2019-11-24 ENCOUNTER — Ambulatory Visit (INDEPENDENT_AMBULATORY_CARE_PROVIDER_SITE_OTHER): Payer: Commercial Managed Care - PPO | Admitting: Obstetrics & Gynecology

## 2019-11-24 ENCOUNTER — Other Ambulatory Visit: Payer: Self-pay

## 2019-11-24 ENCOUNTER — Other Ambulatory Visit: Payer: Self-pay | Admitting: Obstetrics & Gynecology

## 2019-11-24 ENCOUNTER — Encounter: Payer: Self-pay | Admitting: Obstetrics & Gynecology

## 2019-11-24 VITALS — BP 155/90 | HR 101 | Ht 71.0 in | Wt 228.0 lb

## 2019-11-24 DIAGNOSIS — A630 Anogenital (venereal) warts: Secondary | ICD-10-CM

## 2019-11-24 DIAGNOSIS — R87612 Low grade squamous intraepithelial lesion on cytologic smear of cervix (LGSIL): Secondary | ICD-10-CM | POA: Diagnosis not present

## 2019-11-24 DIAGNOSIS — Z3202 Encounter for pregnancy test, result negative: Secondary | ICD-10-CM

## 2019-11-24 LAB — POCT URINE PREGNANCY: Preg Test, Ur: NEGATIVE

## 2019-11-24 MED ORDER — IMIQUIMOD 5 % EX CREA: TOPICAL_CREAM | CUTANEOUS | 5 refills | Status: DC

## 2019-11-24 NOTE — Patient Instructions (Addendum)
Colposcopy, Care After This sheet gives you information about how to care for yourself after your procedure. Your health care provider may also give you more specific instructions. If you have problems or questions, contact your health care provider. What can I expect after the procedure? If you had a colposcopy without a biopsy, you can expect to feel fine right away, but you may have some spotting for a few days. You can go back to your normal activities. If you had a colposcopy with a biopsy, it is common to have:  Soreness and pain. This may last for a few days.  Light-headedness.  Mild vaginal bleeding or dark-colored, grainy discharge. This may last for a few days. The discharge may be due to a solution that was used during the procedure. You may need to wear a sanitary pad during this time.  Spotting for at least 48 hours after the procedure. Follow these instructions at home:   Take over-the-counter and prescription medicines only as told by your health care provider. Talk with your health care provider about what type of over-the-counter pain medicine and prescription medicine you can start taking again. It is especially important to talk with your health care provider if you take blood-thinning medicine.  Do not drive or use heavy machinery while taking prescription pain medicine.  For at least 3 days after your procedure, or as long as told by your health care provider, avoid: ? Douching. ? Using tampons. ? Having sexual intercourse.  Continue to use birth control (contraception).  Limit your physical activity for the first day after the procedure as told by your health care provider. Ask your health care provider what activities are safe for you.  It is up to you to get the results of your procedure. Ask your health care provider, or the department performing the procedure, when your results will be ready.  Keep all follow-up visits as told by your health care provider.  This is important. Contact a health care provider if:  You develop a skin rash. Get help right away if:  You are bleeding heavily from your vagina or you are passing blood clots. This includes using more than one sanitary pad per hour for 2 hours in a row.  You have a fever or chills.  You have pelvic pain.  You have abnormal, yellow-colored, or bad-smelling vaginal discharge. This could be a sign of infection.  You have severe pain or cramps in your lower abdomen that do not get better with medicine.  You feel light-headed or dizzy, or you faint. Summary  If you had a colposcopy without a biopsy, you can expect to feel fine right away, but you may have some spotting for a few days. You can go back to your normal activities.  If you had a colposcopy with a biopsy, you may notice mild pain and spotting for 48 hours after the procedure.  Avoid douching, using tampons, and having sexual intercourse for 3 days after the procedure or as long as told by your health care provider.  Contact your health care provider if you have bleeding, severe pain, or signs of infection. This information is not intended to replace advice given to you by your health care provider. Make sure you discuss any questions you have with your health care provider. Document Revised: 07/24/2017 Document Reviewed: 03/28/2016 Elsevier Patient Education  2020 Elsevier Inc.   HPV (Human Papillomavirus) Vaccine: What You Need to Know 1. Why get vaccinated? HPV (Human papillomavirus) vaccine  can prevent infection with some types of human papillomavirus. HPV infections can cause certain types of cancers including:  cervical, vaginal and vulvar cancers in women,  penile cancer in men, and  anal cancers in both men and women. HPV vaccine prevents infection from the HPV types that cause over 90% of these cancers. HPV is spread through intimate skin-to-skin or sexual contact. HPV infections are so common that nearly  all men and women will get at least one type of HPV at some time in their lives. Most HPV infections go away by themselves within 2 years. But sometimes HPV infections will last longer and can cause cancers later in life. 2. HPV vaccine HPV vaccine is routinely recommended for adolescents at 15 or 41 years of age to ensure they are protected before they are exposed to the virus. HPV vaccine may be given beginning at age 66 years, and as late as age 59 years. Most people older than 26 years will not benefit from HPV vaccination. Talk with your health care provider if you want more information. Most children who get the first dose before 36 years of age need 2 doses of HPV vaccine. Anyone who gets the first dose on or after 41 years of age, and younger people with certain immunocompromising conditions, need 3 doses. Your health care provider can give you more information. HPV vaccine may be given at the same time as other vaccines. 3. Talk with your health care provider Tell your vaccine provider if the person getting the vaccine:  Has had an allergic reaction after a previous dose of HPV vaccine, or has any severe, life-threatening allergies.  Is pregnant. In some cases, your health care provider may decide to postpone HPV vaccination to a future visit. People with minor illnesses, such as a cold, may be vaccinated. People who are moderately or severely ill should usually wait until they recover before getting HPV vaccine. Your health care provider can give you more information. 4. Risks of a vaccine reaction  Soreness, redness, or swelling where the shot is given can happen after HPV vaccine.  Fever or headache can happen after HPV vaccine. People sometimes faint after medical procedures, including vaccination. Tell your provider if you feel dizzy or have vision changes or ringing in the ears. As with any medicine, there is a very remote chance of a vaccine causing a severe allergic reaction,  other serious injury, or death. 5. What if there is a serious problem? An allergic reaction could occur after the vaccinated person leaves the clinic. If you see signs of a severe allergic reaction (hives, swelling of the face and throat, difficulty breathing, a fast heartbeat, dizziness, or weakness), call 9-1-1 and get the person to the nearest hospital. For other signs that concern you, call your health care provider. Adverse reactions should be reported to the Vaccine Adverse Event Reporting System (VAERS). Your health care provider will usually file this report, or you can do it yourself. Visit the VAERS website at www.vaers.LAgents.no or call 747 614 6455. VAERS is only for reporting reactions, and VAERS staff do not give medical advice. 6. The National Vaccine Injury Compensation Program The [employer] Vaccine Injury Compensation Program (VICP) is a federal program that was created to compensate people who may have been injured by certain vaccines. Visit the VICP website at SpiritualWord.at or call 724-493-8105 to learn about the program and about filing a claim. There is a time limit to file a claim for compensation. 7. How can I learn more?  Ask your health care provider.  Call your local or state health department.  Contact the Centers for Disease Control and Prevention (CDC): ? Call 867-787-8733 (1-800-CDC-INFO) or ? Visit CDC's website at http://hunter.com/ Vaccine Information Statement HPV Vaccine (06/23/2018) This information is not intended to replace advice given to you by your health care provider. Make sure you discuss any questions you have with your health care provider. Document Revised: 11/30/2018 Document Reviewed: 03/23/2018 Elsevier Patient Education  [place].

## 2019-11-24 NOTE — Progress Notes (Signed)
aldara not working for warts      GYNECOLOGY CLINIC COLPOSCOPY PROCEDURE NOTE  39 y.o. G0P0000 here for colposcopy for low-grade squamous intraepithelial neoplasia (LGSIL - encompassing HPV,mild dysplasia,CIN I) pap smear on 10/11/2019. Discussed role for HPV in cervical dysplasia, need for surveillance.  Patient also has a cluster of genital warts, wants to discuss management  Patient given informed consent, signed copy in the chart, time out was performed.  Placed in lithotomy position. 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     Cervix viewed with speculum and colposcope after application of acetic acid.   Colposcopy adequate? Yes Large acetowhite lesion noted at 6 o'clock; corresponding biopsies obtained.  ECC specimen obtained.  All specimens were labeled and sent to pathology.  Patient was given post procedure instructions.  Will follow up pathology and manage accordingly; patient will be contacted with results and recommendations.  Aldara cream prescribed for genital warts. Counsled about HPV vaccine, she will call insurance to make sure it is covered and then receive the series.  Routine preventative health maintenance measures emphasized.    Jaynie Collins, MD, FACOG Attending Obstetrician & Gynecologist, [facility] for [employer], [facility]

## 2019-11-29 ENCOUNTER — Telehealth: Payer: Self-pay | Admitting: *Deleted

## 2019-11-29 ENCOUNTER — Encounter: Payer: Self-pay | Admitting: Obstetrics & Gynecology

## 2019-11-29 DIAGNOSIS — D069 Carcinoma in situ of cervix, unspecified: Secondary | ICD-10-CM | POA: Insufficient documentation

## 2019-11-29 HISTORY — DX: Carcinoma in situ of cervix, unspecified: D06.9

## 2019-11-29 NOTE — Telephone Encounter (Signed)
-----   Message from Tereso Newcomer, MD sent at 11/29/2019 12:07 PM EDT ----- 11/24/2019 Colposcopy Pathology Diagnosis  1. 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  2. Endocervix, curettage  - BENIGN ENDOCERVICAL GLANDULAR EPITHELIUM.   Patient needs a LEEP. Please call to inform patient of results and recommendations.    Jaynie Collins, MD

## 2019-11-29 NOTE — Telephone Encounter (Signed)
Discussed results with pt and the need for a leep procedure. Pt verbalizes and understands.

## 2019-11-29 NOTE — Progress Notes (Signed)
11/24/2019 Colposcopy Pathology Diagnosis  1. 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  2. Endocervix, curettage  - BENIGN ENDOCERVICAL GLANDULAR EPITHELIUM.   Patient needs a LEEP. Please call to inform patient of results and recommendations.    Jaynie Collins, MD

## 2019-12-15 ENCOUNTER — Other Ambulatory Visit: Payer: Self-pay | Admitting: Obstetrics & Gynecology

## 2019-12-15 ENCOUNTER — Other Ambulatory Visit: Payer: Self-pay

## 2019-12-15 ENCOUNTER — Ambulatory Visit (INDEPENDENT_AMBULATORY_CARE_PROVIDER_SITE_OTHER): Payer: Commercial Managed Care - PPO | Admitting: Obstetrics & Gynecology

## 2019-12-15 ENCOUNTER — Encounter: Payer: Self-pay | Admitting: *Deleted

## 2019-12-15 VITALS — BP 136/91 | HR 102 | Wt 221.0 lb

## 2019-12-15 DIAGNOSIS — D069 Carcinoma in situ of cervix, unspecified: Secondary | ICD-10-CM

## 2019-12-15 DIAGNOSIS — Z3202 Encounter for pregnancy test, result negative: Secondary | ICD-10-CM

## 2019-12-15 LAB — POCT URINE PREGNANCY: Preg Test, Ur: NEGATIVE

## 2019-12-15 NOTE — Patient Instructions (Addendum)
LEEP POST-PROCEDURE INSTRUCTIONS  1. You may take Ibuprofen, Aleve or Tylenol for pain if needed.  Cramping is normal.  2. You will have black and/or bloody discharge at first.  This will lighten and then turn clear before completely resolving.  This will take 2 to 3 weeks.  3. Put nothing in your vagina until the bleeding or discharge stops (usually 2 or 3 weeks).  4. You need to call if you have redness around the biopsy site, if there is any unusual draining, if the bleeding is heavy, or if you are concerned.  5. We will call you within one week with results or we will discuss the results at your follow-up appointment if needed.  6. You will need to return for a follow-up Pap smear as directed by your physician.

## 2019-12-15 NOTE — Progress Notes (Signed)
   GYNECOLOGY OFFICE PROCEDURE NOTE  Hannah Foley is a 39 y.o. G0P0000 here for LEEP. No GYN concerns. Pap smear and colposcopy history reviewed.    Pap 10/11/2019  LGSIL Colpo Biopsy 11/24/2019 CIN II-III, ECC negative  Risks, benefits, alternatives, and limitations of procedure explained to patient, including pain, bleeding, infection, failure to remove abnormal tissue and failure to cure dysplasia, need for repeat procedures, damage to pelvic organs, cervical incompetence.  Role of HPV, cervical dysplasia and need for close followup was empasized. Informed written consent was obtained. All questions were answered. Time out performed. Urine pregnancy test was negative.  Procedure: The patient was placed in lithotomy position and the bivalved coated speculum was placed in the patient's vagina. A grounding pad placed on the patient. Lugol's solution was applied to the cervix and areas of decreased uptake were noted around the transformation zone.   Local anesthesia was administered via an intracervical block using 10 ml of 2% Lidocaine with epinephrine. The suction was turned on and the Large 1X Fisher Cone Biopsy Excisor on 41 Watts of blended current was used to excise the area of decreased uptake and excise the entire transformation zone. Excellent hemostasis was achieved using roller ball coagulation set at 60 Watts coagulation current. Monsel's solution was then applied and the speculum was removed from the vagina. Specimen was sent to pathology.  The patient tolerated the procedure well. Post-operative instructions given to patient, including instruction to seek medical attention for persistent bright red bleeding, fever, abdominal/pelvic pain, dysuria, nausea or vomiting. She was also told about the possibility of having copious yellow to black tinged discharge for weeks. She was counseled to avoid anything in the vagina (sex/douching/tampons) for 3 weeks. She has a 4 week post-operative check to  assess wound healing, review results and discuss further management.     Hannah Collins, MD, FACOG Obstetrician & Gynecologist, [facility] for [employer], [facility]

## 2019-12-21 ENCOUNTER — Telehealth: Payer: Self-pay | Admitting: *Deleted

## 2019-12-21 NOTE — Telephone Encounter (Signed)
-----   Message from Tereso Newcomer, MD sent at 12/21/2019  9:10 AM EDT ----- 12/15/19 LEEP Pathology - HIGH GRADE SQUAMOUS INTRAEPITHELIAL LESION (CIN-II, MODERATE SQUAMOUS DYSPLASIA), LOCALIZED. NO INVASIVE PROCESS IDENTIFIED. - DIFFUSE LOW GRADE SQUAMOUS INTRAEPITHELIAL LESION (CIN-I, MILD SQUAMOUS DYSPLASIA WITH HPV EFFECT). NO INVASIVE PROCESS IDENTIFIED. - THE SURGICAL RESECTION MARGINS APPEAR FREE OF THE LESION.  Patient will need repeat pap and HPV tests in 12 and 24 months.  Please call to inform patient of results and recommendations.   Jaynie Collins, MD

## 2019-12-21 NOTE — Progress Notes (Signed)
12/15/19 LEEP Pathology - HIGH GRADE SQUAMOUS INTRAEPITHELIAL LESION (CIN-II, MODERATE SQUAMOUS DYSPLASIA), LOCALIZED. NO INVASIVE PROCESS IDENTIFIED. - DIFFUSE LOW GRADE SQUAMOUS INTRAEPITHELIAL LESION (CIN-I, MILD SQUAMOUS DYSPLASIA WITH HPV EFFECT). NO INVASIVE PROCESS IDENTIFIED. - THE SURGICAL RESECTION MARGINS APPEAR FREE OF THE LESION.  Patient will need repeat pap and HPV tests in 12 and 24 months.  Please call to inform patient of results and recommendations.   Jaynie Collins, MD

## 2019-12-21 NOTE — Telephone Encounter (Signed)
Called pt to inform her of results and recommendations from Dr Macon Large

## 2020-01-09 ENCOUNTER — Encounter: Payer: Self-pay | Admitting: Nurse Practitioner

## 2020-01-09 ENCOUNTER — Other Ambulatory Visit: Payer: Self-pay

## 2020-01-09 ENCOUNTER — Ambulatory Visit: Payer: Commercial Managed Care - PPO | Admitting: Nurse Practitioner

## 2020-01-09 VITALS — BP 120/64 | HR 88 | Temp 97.8°F | Ht 71.0 in | Wt 221.4 lb

## 2020-01-09 DIAGNOSIS — D72829 Elevated white blood cell count, unspecified: Secondary | ICD-10-CM

## 2020-01-09 DIAGNOSIS — E782 Mixed hyperlipidemia: Secondary | ICD-10-CM

## 2020-01-09 DIAGNOSIS — E1165 Type 2 diabetes mellitus with hyperglycemia: Secondary | ICD-10-CM | POA: Diagnosis not present

## 2020-01-09 LAB — LIPID PANEL
Cholesterol: 122 mg/dL (ref 0–200)
HDL: 27 mg/dL — ABNORMAL LOW (ref >39.00)
LDL Cholesterol: 75 mg/dL (ref 0–99)
NonHDL: 95.32
Total CHOL/HDL Ratio: 5
Triglycerides: 101 mg/dL (ref 0.0–149.0)
VLDL: 20.2 mg/dL (ref 0.0–40.0)

## 2020-01-09 LAB — CBC WITH DIFFERENTIAL/PLATELET
Basophils Absolute: 0 10*3/uL (ref 0.0–0.1)
Basophils Relative: 0.4 % (ref 0.0–3.0)
Eosinophils Absolute: 0 10*3/uL (ref 0.0–0.7)
Eosinophils Relative: 0.5 % (ref 0.0–5.0)
HCT: 43.2 % (ref 36.0–46.0)
Hemoglobin: 14.5 g/dL (ref 12.0–15.0)
Lymphocytes Relative: 23.8 % (ref 12.0–46.0)
Lymphs Abs: 2.2 10*3/uL (ref 0.7–4.0)
MCHC: 33.7 g/dL (ref 30.0–36.0)
MCV: 84.3 fl (ref 78.0–100.0)
Monocytes Absolute: 0.3 10*3/uL (ref 0.1–1.0)
Monocytes Relative: 3.7 % (ref 3.0–12.0)
Neutro Abs: 6.5 10*3/uL (ref 1.4–7.7)
Neutrophils Relative %: 71.6 % (ref 43.0–77.0)
Platelets: 264 10*3/uL (ref 150.0–400.0)
RBC: 5.12 Mil/uL — ABNORMAL HIGH (ref 3.87–5.11)
RDW: 13.3 % (ref 11.5–15.5)
WBC: 9.1 10*3/uL (ref 4.0–10.5)

## 2020-01-09 LAB — HEMOGLOBIN A1C: Hgb A1c MFr Bld: 8.9 % — ABNORMAL HIGH (ref 4.6–6.5)

## 2020-01-09 MED ORDER — LIRAGLUTIDE 18 MG/3ML ~~LOC~~ SOPN: PEN_INJECTOR | SUBCUTANEOUS | 5 refills | Status: DC

## 2020-01-09 MED ORDER — PEN NEEDLES 31G X 6 MM MISC: 1.0000 "application " | Freq: Every day | 5 refills | Status: DC

## 2020-01-09 NOTE — Assessment & Plan Note (Signed)
Uncontrolled with Hgb A1c at 8.9 Reports postprandial glucose >200s. Current use of glipizide and metformin. Denies any GI side effects No Fhx of thyroid or pancreatitic cancer. She has no hx of GERD or constipation. Previous use of victoza, med was discontinued due to high copay. She is willing to try medication again Does not check fasting glucose. States she has made changes to her diet. Denies need for referral to nutritionist. BP at goal LDL at goal with lipitor  D/c glipizide and start victoza injection. Continue metformin Check glucose in AM (before breakfast) and record. Bring glucose reading to next OV. Advised to schedule eye appt F/up in 51months

## 2020-01-09 NOTE — Patient Instructions (Signed)
Go to lab for blood draw Schedule appt with ophthalmology for DM eye exam.  D/c glipizide and start victoza injection. Continue metformin Check glucose in AM (before breakfast) and record. Bring glucose reading to next OV.   Diabetes Mellitus and Nutrition, Adult When you have diabetes (diabetes mellitus), it is very important to have healthy eating habits because your blood sugar (glucose) levels are greatly affected by what you eat and drink. Eating healthy foods in the appropriate amounts, at about the same times every day, can help you:  Control your blood glucose.  Lower your risk of heart disease.  Improve your blood pressure.  Reach or maintain a healthy weight. Every person with diabetes is different, and each person has different needs for a meal plan. Your health care provider may recommend that you work with a diet and nutrition specialist (dietitian) to make a meal plan that is best for you. Your meal plan may vary depending on factors such as:  The calories you need.  The medicines you take.  Your weight.  Your blood glucose, blood pressure, and cholesterol levels.  Your activity level.  Other health conditions you have, such as heart or kidney disease. How do carbohydrates affect me? Carbohydrates, also called carbs, affect your blood glucose level more than any other type of food. Eating carbs naturally raises the amount of glucose in your blood. Carb counting is a method for keeping track of how many carbs you eat. Counting carbs is important to keep your blood glucose at a healthy level, especially if you use insulin or take certain oral diabetes medicines. It is important to know how many carbs you can safely have in each meal. This is different for every person. Your dietitian can help you calculate how many carbs you should have at each meal and for each snack. Foods that contain carbs include:  Bread, cereal, rice, pasta, and crackers.  Potatoes and  corn.  Peas, beans, and lentils.  Milk and yogurt.  Fruit and juice.  Desserts, such as cakes, cookies, ice cream, and candy. How does alcohol affect me? Alcohol can cause a sudden decrease in blood glucose (hypoglycemia), especially if you use insulin or take certain oral diabetes medicines. Hypoglycemia can be a life-threatening condition. Symptoms of hypoglycemia (sleepiness, dizziness, and confusion) are similar to symptoms of having too much alcohol. If your health care provider says that alcohol is safe for you, follow these guidelines:  Limit alcohol intake to no more than 1 drink per day for nonpregnant women and 2 drinks per day for men. One drink equals 12 oz of beer, 5 oz of wine, or 1 oz of hard liquor.  Do not drink on an empty stomach.  Keep yourself hydrated with water, diet soda, or unsweetened iced tea.  Keep in mind that regular soda, juice, and other mixers may contain a lot of sugar and must be counted as carbs. What are tips for following this plan?  Reading food labels  Start by checking the serving size on the "Nutrition Facts" label of packaged foods and drinks. The amount of calories, carbs, fats, and other nutrients listed on the label is based on one serving of the item. Many items contain more than one serving per package.  Check the total grams (g) of carbs in one serving. You can calculate the number of servings of carbs in one serving by dividing the total carbs by 15. For example, if a food has 30 g of total carbs, it  would be equal to 2 servings of carbs.  Check the number of grams (g) of saturated and trans fats in one serving. Choose foods that have low or no amount of these fats.  Check the number of milligrams (mg) of salt (sodium) in one serving. Most people should limit total sodium intake to less than 2,300 mg per day.  Always check the nutrition information of foods labeled as "low-fat" or "nonfat". These foods may be higher in added sugar or  refined carbs and should be avoided.  Talk to your dietitian to identify your daily goals for nutrients listed on the label. Shopping  Avoid buying canned, premade, or processed foods. These foods tend to be high in fat, sodium, and added sugar.  Shop around the outside edge of the grocery store. This includes fresh fruits and vegetables, bulk grains, fresh meats, and fresh dairy. Cooking  Use low-heat cooking methods, such as baking, instead of high-heat cooking methods like deep frying.  Cook using healthy oils, such as olive, canola, or sunflower oil.  Avoid cooking with butter, cream, or high-fat meats. Meal planning  Eat meals and snacks regularly, preferably at the same times every day. Avoid going long periods of time without eating.  Eat foods high in fiber, such as fresh fruits, vegetables, beans, and whole grains. Talk to your dietitian about how many servings of carbs you can eat at each meal.  Eat 4-6 ounces (oz) of lean protein each day, such as lean meat, chicken, fish, eggs, or tofu. One oz of lean protein is equal to: ? 1 oz of meat, chicken, or fish. ? 1 egg. ?  cup of tofu.  Eat some foods each day that contain healthy fats, such as avocado, nuts, seeds, and fish. Lifestyle  Check your blood glucose regularly.  Exercise regularly as told by your health care provider. This may include: ? 150 minutes of moderate-intensity or vigorous-intensity exercise each week. This could be brisk walking, biking, or water aerobics. ? Stretching and doing strength exercises, such as yoga or weightlifting, at least 2 times a week.  Take medicines as told by your health care provider.  Do not use any products that contain nicotine or tobacco, such as cigarettes and e-cigarettes. If you need help quitting, ask your health care provider.  Work with a Social worker or diabetes educator to identify strategies to manage stress and any emotional and social challenges. Questions to ask a  health care provider  Do I need to meet with a diabetes educator?  Do I need to meet with a dietitian?  What number can I call if I have questions?  When are the best times to check my blood glucose? Where to find more information:  American Diabetes Association: diabetes.org  Academy of Nutrition and Dietetics: www.eatright.[employer] of Diabetes and Digestive and Kidney Diseases (NIH): [employer].dk Summary  A healthy meal plan will help you control your blood glucose and maintain a healthy lifestyle.  Working with a diet and nutrition specialist (dietitian) can help you make a meal plan that is best for you.  Keep in mind that carbohydrates (carbs) and alcohol have immediate effects on your blood glucose levels. It is important to count carbs and to use alcohol carefully. This information is not intended to replace advice given to you by your health care provider. Make sure you discuss any questions you have with your health care provider. Document Revised: 07/24/2017 Document Reviewed: 09/15/2016 Elsevier Patient Education  2020 [employer].

## 2020-01-09 NOTE — Progress Notes (Addendum)
Subjective:  Patient ID: Hannah Foley, female    DOB: October 09, 1980  Age: 39 y.o. MRN: [id number]  CC: Follow-up (3 mo f/u-DM,hyperlipidemia-pt had coffee no food//reported blood sugar runnin high, highest 220//pt reports 15lb weight loss with diet changes//no eye exam)  HPI DM: Reports postprandial glucose >200s. Current use of glipizide and metformin. Denies any GI side effects No Fhx of thyroid or pancreatitic cancer. She has no hx of GERD or constipation. Previous use of victoza, med was discontinued due to high copay. She is willing to try medication again Does not check fasting glucose. States she has made changes to her diet. Denies need for referral to nutritionist. BP at goal LDL at goal with lipitor  Reviewed past Medical, Social and Family history today.  Outpatient Medications Prior to Visit  Medication Sig Dispense Refill  . atorvastatin (LIPITOR) 20 MG tablet Take 1 tablet (20 mg total) by mouth daily at 6 PM. 90 tablet 1  . buPROPion (WELLBUTRIN SR) 150 MG 12 hr tablet TAKE 1 TABLET(150 MG) BY MOUTH TWICE DAILY 180 tablet 2  . imiquimod (ALDARA) 5 % cream Apply topically 3 (three) times a week. Apply until total clearance or maximum of 16 weeks 24 each 5  . lisinopril (ZESTRIL) 5 MG tablet Take 0.5 tablets (2.5 mg total) by mouth daily. 45 tablet 1  . metFORMIN (GLUCOPHAGE) 850 MG tablet Take 1 tablet (850 mg total) by mouth 2 (two) times daily with a meal. 180 tablet 1  . glipiZIDE (GLUCOTROL XL) 5 MG 24 hr tablet Take 1 tablet (5 mg total) by mouth daily with breakfast. 90 tablet 1  . norethindrone-ethinyl estradiol-iron (BLISOVI FE 1.5/30) 1.5-30 MG-MCG tablet Take 1 tablet by mouth daily. 84 tablet 3   No facility-administered medications prior to visit.    ROS See HPI  Objective:  BP 120/64   Pulse 88   Temp 97.8 F (36.6 C) (Tympanic)   Ht 5\' 11"  (1.803 m)   Wt 221 lb 6.4 oz (100.4 kg)   SpO2 98%   BMI 30.88 kg/m   BP Readings from Last 3 Encounters:   01/10/20 133/81  01/09/20 120/64  12/15/19 (!) 136/91    Wt Readings from Last 3 Encounters:  01/09/20 221 lb 6.4 oz (100.4 kg)  12/15/19 221 lb (100.2 kg)  11/24/19 228 lb (103.4 kg)    Physical Exam Vitals reviewed.  Cardiovascular:     Rate and Rhythm: Normal rate and regular rhythm.     Pulses: Normal pulses.     Heart sounds: Normal heart sounds.  Pulmonary:     Effort: Pulmonary effort is normal.     Breath sounds: Normal breath sounds.  Abdominal:     General: Bowel sounds are normal.     Palpations: Abdomen is soft.  Musculoskeletal:     Right lower leg: No edema.     Left lower leg: No edema.  Neurological:     Mental Status: She is alert and oriented to person, place, and time.     Lab Results  Component Value Date   WBC 9.1 01/09/2020   HGB 14.5 01/09/2020   HCT 43.2 01/09/2020   PLT 264.0 01/09/2020   GLUCOSE 222 (H) 10/11/2019   CHOL 122 01/09/2020   TRIG 101.0 01/09/2020   HDL 27.00 (L) 01/09/2020   LDLCALC 75 01/09/2020   ALT 19 10/11/2019   AST 14 10/11/2019   NA 135 10/11/2019   K 4.6 10/11/2019   CL 99 10/11/2019  CREATININE 0.82 10/11/2019   BUN 12 10/11/2019   CO2 28 10/11/2019   TSH 1.83 10/06/2018   HGBA1C 8.9 (H) 01/09/2020   MICROALBUR 2.0 (H) 10/11/2019    Assessment & Plan:  This visit occurred during the SARS-CoV-2 public health emergency.  Safety protocols were in place, including screening questions prior to the visit, additional usage of staff PPE, and extensive cleaning of exam room while observing appropriate contact time as indicated for disinfecting solutions.   Dawnetta was seen today for follow-up.  Diagnoses and all orders for this visit:  Uncontrolled type 2 diabetes mellitus with hyperglycemia (HCC) -     Hemoglobin A1c -     liraglutide (VICTOZA) 18 MG/3ML SOPN; Inject 0.1 mLs (0.6 mg total) into the skin daily for 7 days, THEN 0.2 mLs (1.2 mg total) daily for 14 days, THEN 0.3 mLs (1.8 mg total) daily for 7 days.  Dispense with needles. -     Insulin Pen Needle (PEN NEEDLES) 31G X 6 MM MISC; 1 application by Does not apply route at bedtime. For Victoza pen  Leukocytosis, unspecified type -     CBC w/Diff  Mixed hyperlipidemia -     Lipid panel   I have discontinued Stephannie C. Rogel's glipiZIDE. I am also having her start on liraglutide and Pen Needles. Additionally, I am having her maintain her buPROPion, metFORMIN, lisinopril, atorvastatin, and imiquimod.  Meds ordered this encounter  Medications  . liraglutide (VICTOZA) 18 MG/3ML SOPN    Sig: Inject 0.1 mLs (0.6 mg total) into the skin daily for 7 days, THEN 0.2 mLs (1.2 mg total) daily for 14 days, THEN 0.3 mLs (1.8 mg total) daily for 7 days. Dispense with needles.    Dispense:  1 pen    Refill:  5    Discontinue glipizide    Order Specific Question:   Supervising Provider    Answer:   Overton Mam [[id number]]  . Insulin Pen Needle (PEN NEEDLES) 31G X 6 MM MISC    Sig: 1 application by Does not apply route at bedtime. For Victoza pen    Dispense:  30 each    Refill:  5    Order Specific Question:   Supervising Provider    Answer:   Overton Mam [[id number]]    Problem List Items Addressed This Visit      Endocrine   DM (diabetes mellitus) (HCC) - Primary    Uncontrolled with Hgb A1c at 8.9 Reports postprandial glucose >200s. Current use of glipizide and metformin. Denies any GI side effects No Fhx of thyroid or pancreatitic cancer. She has no hx of GERD or constipation. Previous use of victoza, med was discontinued due to high copay. She is willing to try medication again Does not check fasting glucose. States she has made changes to her diet. Denies need for referral to nutritionist. BP at goal LDL at goal with lipitor  D/c glipizide and start victoza injection. Continue metformin Check glucose in AM (before breakfast) and record. Bring glucose reading to next OV. Advised to schedule eye appt F/up in 63months       Relevant Medications   liraglutide (VICTOZA) 18 MG/3ML SOPN   Insulin Pen Needle (PEN NEEDLES) 31G X 6 MM MISC     Other   Leukocytosis   Relevant Orders   CBC w/Diff (Completed)    Other Visit Diagnoses    Mixed hyperlipidemia       Relevant Orders   Lipid panel (Completed)  Follow-up: Return in about 3 months (around 04/10/2020) for DM and HTN, hyperlipidemia (video, 38mins).  Wilfred Lacy, NP

## 2020-01-10 ENCOUNTER — Ambulatory Visit (INDEPENDENT_AMBULATORY_CARE_PROVIDER_SITE_OTHER): Payer: Commercial Managed Care - PPO | Admitting: Obstetrics & Gynecology

## 2020-01-10 ENCOUNTER — Encounter: Payer: Self-pay | Admitting: Obstetrics & Gynecology

## 2020-01-10 VITALS — BP 133/81 | HR 101

## 2020-01-10 DIAGNOSIS — Z3041 Encounter for surveillance of contraceptive pills: Secondary | ICD-10-CM

## 2020-01-10 DIAGNOSIS — Z48816 Encounter for surgical aftercare following surgery on the genitourinary system: Secondary | ICD-10-CM

## 2020-01-10 DIAGNOSIS — Z9889 Other specified postprocedural states: Secondary | ICD-10-CM

## 2020-01-10 MED ORDER — NORETHIN ACE-ETH ESTRAD-FE 1.5-30 MG-MCG PO TABS: 1.0000 | ORAL_TABLET | Freq: Every day | ORAL | 4 refills | Status: DC

## 2020-01-10 NOTE — Progress Notes (Signed)
    GYNECOLOGY POST PROCEDURE VISIT NOTE  Subjective:     Hannah Foley is a 39 y.o. 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 Had some expected discharge x 2 weeks. No other concerns. The patient is not having any pain.  The following portions of the patient's history were reviewed and updated as appropriate: allergies, current medications, past family history, past medical history, past social history, past surgical history and problem list.  Review of Systems Pertinent items noted in HPI and remainder of comprehensive ROS otherwise negative.    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 Chaperone present.   12/15/19 LEEP Pathology - HIGH GRADE SQUAMOUS INTRAEPITHELIAL LESION (CIN-II, MODERATE SQUAMOUS DYSPLASIA), LOCALIZED. NO INVASIVE PROCESS IDENTIFIED. - DIFFUSE LOW GRADE SQUAMOUS INTRAEPITHELIAL LESION (CIN-I, MILD SQUAMOUS DYSPLASIA WITH HPV EFFECT). NO INVASIVE PROCESS IDENTIFIED. - THE SURGICAL RESECTION MARGINS APPEAR FREE OF THE LESION.    Assessment:    Doing well after LEEP. Pathology report discussed.    Plan:     1. Will repeat cotesting in 12 and 24 months 2. Continue Aldara cream 3. Activity restrictions: none 4. Follow up as needed or as recommended.    Jaynie Collins, MD, FACOG Obstetrician & Gynecologist, [facility] for [employer], [facility]

## 2020-01-10 NOTE — Patient Instructions (Signed)
Return to clinic for any scheduled appointments or for any gynecologic concerns as needed.   

## 2020-02-04 ENCOUNTER — Encounter: Payer: Self-pay | Admitting: Nurse Practitioner

## 2020-02-04 DIAGNOSIS — E1165 Type 2 diabetes mellitus with hyperglycemia: Secondary | ICD-10-CM

## 2020-02-07 MED ORDER — OZEMPIC (0.25 OR 0.5 MG/DOSE) 2 MG/1.5ML ~~LOC~~ SOPN: PEN_INJECTOR | SUBCUTANEOUS | 0 refills | Status: DC

## 2020-02-09 ENCOUNTER — Telehealth: Payer: Self-pay

## 2020-02-09 NOTE — Telephone Encounter (Signed)
Started PA for Ozempic on Rx benefits site.  Key: [id number]

## 2020-02-16 ENCOUNTER — Telehealth: Payer: Self-pay

## 2020-02-16 NOTE — Telephone Encounter (Signed)
Form faxed over to Rx Benefits for PA.

## 2020-02-17 ENCOUNTER — Telehealth: Payer: Self-pay

## 2020-02-17 NOTE — Telephone Encounter (Signed)
Hannah Foley please advise.  Pt had more [employer] forms to be filled out for her PA for Ozempic.Pt states she feels like this will help her to get financial help for medication.

## 2020-02-20 NOTE — Telephone Encounter (Signed)
Ok to complete same as previous form

## 2020-02-22 NOTE — Telephone Encounter (Signed)
Form was completed an faxed back to [facility].

## 2020-02-24 ENCOUNTER — Telehealth: Payer: Self-pay

## 2020-02-24 NOTE — Telephone Encounter (Signed)
Chart notes to support PA for Saxenda was faxed to Rx Benefits for pending approval.

## 2020-03-02 ENCOUNTER — Telehealth: Payer: Self-pay | Admitting: Nurse Practitioner

## 2020-03-02 ENCOUNTER — Other Ambulatory Visit: Payer: Self-pay | Admitting: Nurse Practitioner

## 2020-03-02 DIAGNOSIS — E1165 Type 2 diabetes mellitus with hyperglycemia: Secondary | ICD-10-CM

## 2020-03-02 NOTE — Telephone Encounter (Signed)
Hannah Foley is calling and wanted to speak to someone regarding getting a PA for ozempic, please advise. Paperwork is being faxed over for [facility] to complete. CB is [phone]

## 2020-03-02 NOTE — Telephone Encounter (Signed)
Charlotte please advise.  Pt called in an said Rx Benefits faxed over paperwork and for me to call them. I notified Rx benefits about her PA denial for her Ozempic, they let me know that it was denied because the dosage and frequency wasn't accepted by her insurance coverage, they were unable to tell me what was covered. Pt is asking what to do since she has been without medication now for a month.

## 2020-03-04 NOTE — Telephone Encounter (Signed)
What was the last dose used of Ozempic?

## 2020-03-05 ENCOUNTER — Encounter: Payer: Self-pay | Admitting: Nurse Practitioner

## 2020-03-05 NOTE — Telephone Encounter (Signed)
Pt wrote the following in a my chart message:  I have not had this medicine at all. She switched me to it from victoza and I have not had it filled because of all the insurance issues

## 2020-03-05 NOTE — Telephone Encounter (Signed)
Pt was called and VM was left to either return call or send my chart message with last dose that pt was taking of Ozempic.

## 2020-03-07 MED ORDER — TRULICITY 0.75 MG/0.5ML ~~LOC~~ SOAJ: 0.7500 mg | SUBCUTANEOUS | 5 refills | Status: DC

## 2020-03-07 NOTE — Telephone Encounter (Signed)
trulicity (dulaglutide) which is once weekly injection could be an option, or maybe januiva (oral med taken daily). Pt and pharmacy could see what meds are covered so this wold be less of a guessing game for [place] and myself.

## 2020-03-07 NOTE — Telephone Encounter (Signed)
Pt was notified and said she was going to check with her pharmacy and insurance to see if maybe Trulicity is covered and get more information on what is covered to give directions for her care.

## 2020-03-07 NOTE — Telephone Encounter (Signed)
Dr. Salena Saner please advise.  Pt is asking if there is anything you would suggest since her Ozempic was denied. SHes been without medication for almost a month and states shes been very careful with her diet and it hasn't been extremely high but she is worried.   She never took [employer] was asking her last dosage she was on Victoza, and had to stop cause of the price of it. Another rx was sent in on 03/05/20 but shes stating it just saids a PA is needed insurance won't pay and she knows it won't be approved.

## 2020-03-07 NOTE — Addendum Note (Signed)
Addended by: Overton Mam on: 03/07/2020 05:04 PM   Modules accepted: Orders

## 2020-03-28 ENCOUNTER — Other Ambulatory Visit: Payer: Self-pay | Admitting: Nurse Practitioner

## 2020-03-28 DIAGNOSIS — E1169 Type 2 diabetes mellitus with other specified complication: Secondary | ICD-10-CM

## 2020-05-02 ENCOUNTER — Encounter: Payer: Self-pay | Admitting: Nurse Practitioner

## 2020-05-02 DIAGNOSIS — E1165 Type 2 diabetes mellitus with hyperglycemia: Secondary | ICD-10-CM

## 2020-05-04 MED ORDER — VICTOZA 18 MG/3ML ~~LOC~~ SOPN: 1.8000 mg | PEN_INJECTOR | Freq: Every day | SUBCUTANEOUS | 2 refills | Status: DC

## 2020-09-20 ENCOUNTER — Telehealth: Payer: Self-pay | Admitting: Nurse Practitioner

## 2020-09-20 NOTE — Telephone Encounter (Signed)
Ok to transfer, next available.

## 2020-09-20 NOTE — Telephone Encounter (Signed)
Pt called in wanted to know about getting a transfer from [facility] to [facility]

## 2020-09-20 NOTE — Telephone Encounter (Signed)
ok 

## 2020-09-26 ENCOUNTER — Encounter: Payer: Self-pay | Admitting: Nurse Practitioner

## 2020-09-26 DIAGNOSIS — E1169 Type 2 diabetes mellitus with other specified complication: Secondary | ICD-10-CM

## 2020-09-26 MED ORDER — METFORMIN HCL 850 MG PO TABS: ORAL_TABLET | ORAL | 0 refills | Status: DC

## 2020-10-30 ENCOUNTER — Other Ambulatory Visit: Payer: Self-pay

## 2020-10-30 ENCOUNTER — Ambulatory Visit (INDEPENDENT_AMBULATORY_CARE_PROVIDER_SITE_OTHER): Payer: Commercial Managed Care - PPO

## 2020-10-30 ENCOUNTER — Ambulatory Visit
Admission: RE | Admit: 2020-10-30 | Discharge: 2020-10-30 | Disposition: A | Discharge status: Home / Self Care | Payer: Commercial Managed Care - PPO | Source: Ambulatory Visit | Attending: Family Medicine | Admitting: Family Medicine

## 2020-10-30 VITALS — BP 158/91 | HR 108 | Temp 99.1°F | Resp 18 | Ht 70.0 in | Wt 220.0 lb

## 2020-10-30 DIAGNOSIS — M25561 Pain in right knee: Secondary | ICD-10-CM | POA: Diagnosis not present

## 2020-10-30 MED ORDER — MELOXICAM 7.5 MG PO TABS
7.5000 mg | ORAL_TABLET | Freq: Every day | ORAL | 0 refills | Status: DC
Start: 2020-10-30 — End: 2021-01-29

## 2020-10-30 NOTE — ED Provider Notes (Signed)
Hannah Foley    CSN: [id number] Arrival date & time: 10/30/20  0849      History   Chief Complaint Chief Complaint  Patient presents with  . Knee Pain    right    HPI Hannah Foley is a 40 y.o. female.   40 year old female presents today with right knee pain.  The pain has been constant for the past 3 days.  Worse with flexion and ambulation.  No specific swelling, injuries.  History of similar in the past.  Has been taking ibuprofen for the pain.     Past Medical History:  Diagnosis Date  . Diabetes mellitus    type 2  . Low back pain 03/12/2011    Patient Active Problem List   Diagnosis Date Noted  . CIN III (cervical intraepithelial neoplasia III) 11/29/2019  . Low grade squamous intraepith lesion on cytologic smear cervix (lgsil) 10/28/2019  . HPV (human papilloma virus) anogenital infection 10/13/2019  . Leukocytosis 10/12/2019  . Family history of breast cancer in first degree relative 10/11/2019  . Bilateral lumbar radiculopathy 11/30/2018  . Insomnia 06/09/2018  . Anxiety 06/09/2018  . Infected cyst of skin 10/13/2014  . Tobacco use disorder 08/03/2014  . High cholesterol 09/06/2013  . Morbid obesity (HCC) 06/14/2013  . DM (diabetes mellitus) (HCC) 10/25/2010    Past Surgical History:  Procedure Laterality Date  . CHOLECYSTECTOMY    . DIAGNOSTIC LAPAROSCOPY     Endometriosis  . FOOT SURGERY     Cyst removed    OB History    Gravida  0   Para  0   Term  0   Preterm  0   AB  0   Living  0     SAB  0   IAB  0   Ectopic  0   Multiple  0   Live Births  0            Home Medications    Prior to Admission medications   Medication Sig Start Date End Date Taking? Authorizing Provider  meloxicam (MOBIC) 7.5 MG tablet Take 1 tablet (7.5 mg total) by mouth daily. 10/30/20  Yes Yulisa Chirico A, NP  metFORMIN (GLUCOPHAGE) 850 MG tablet TAKE 1 TABLET(850 MG) BY MOUTH TWICE DAILY WITH A MEAL 09/26/20  Yes [facility], Bonna Gains, NP   norethindrone-ethinyl estradiol-iron (BLISOVI FE 1.5/30) 1.5-30 MG-MCG tablet Take 1 tablet by mouth daily. Take active pills in a continuous fashion.  Skip placebo pills. 01/10/20  Yes Anyanwu, Jethro Bastos, MD  atorvastatin (LIPITOR) 20 MG tablet Take 1 tablet (20 mg total) by mouth daily at 6 PM. 10/12/19   [facility], Bonna Gains, NP  buPROPion (WELLBUTRIN SR) 150 MG 12 hr tablet TAKE 1 TABLET(150 MG) BY MOUTH TWICE DAILY 07/08/19   Dianne Dun, MD  imiquimod Mathis Dad) 5 % cream Apply topically 3 (three) times a week. Apply until total clearance or maximum of 16 weeks 11/25/19   Anyanwu, Jethro Bastos, MD  Insulin Pen Needle (PEN NEEDLES) 31G X 6 MM MISC 1 application by Does not apply route at bedtime. For Victoza pen 01/09/20   [facility], Bonna Gains, NP  liraglutide (VICTOZA) 18 MG/3ML SOPN Inject 1.8 mg into the skin daily. Need office visit for additional refills. 05/04/20   [facility], Bonna Gains, NP  lisinopril (ZESTRIL) 5 MG tablet Take 0.5 tablets (2.5 mg total) by mouth daily. 10/12/19   [facility], Bonna Gains, NP    Family History Family History  Problem Relation Age of Onset  . Cancer Paternal Aunt 70       breast  . Cancer Other        breast  . Cancer Mother   . Breast cancer Mother 15    Social History Social History   Tobacco Use  . Smoking status: Current Every Day Smoker    Packs/day: 1.00    Types: Cigarettes  . Smokeless tobacco: Never Used  Vaping Use  . Vaping Use: Never used  Substance Use Topics  . Alcohol use: Yes    Alcohol/week: 0.0 standard drinks    Comment: occasional  . Drug use: No     Allergies   Patient has no known allergies.   Review of Systems Review of Systems   Physical Exam Triage Vital Signs ED Triage Vitals  Enc Vitals Group     BP 10/30/20 0859 (!) 158/91     Pulse Rate 10/30/20 0859 (!) 108     Resp 10/30/20 0859 18     Temp 10/30/20 0859 99.1 F (37.3 C)     Temp Source 10/30/20 0859 Oral     SpO2 10/30/20 0859 98 %     Weight 10/30/20  0900 220 lb (99.8 kg)     Height 10/30/20 0900 5\' 10"  (1.778 m)     Head Circumference --      Peak Flow --      Pain Score 10/30/20 0900 8     Pain Loc --      Pain Edu? --      Excl. in GC? --    No data found.  Updated Vital Signs BP (!) 158/91 (BP Location: Left Arm)   Pulse (!) 108   Temp 99.1 F (37.3 C) (Oral)   Resp 18   Ht 5\' 10"  (1.778 m)   Wt 220 lb (99.8 kg)   LMP 10/16/2020   SpO2 98%   BMI 31.57 kg/m   Visual Acuity Right Eye Distance:   Left Eye Distance:   Bilateral Distance:    Right Eye Near:   Left Eye Near:    Bilateral Near:     Physical Exam Vitals and nursing note reviewed.  Constitutional:      General: She is not in acute distress.    Appearance: Normal appearance. She is not ill-appearing, toxic-appearing or diaphoretic.  HENT:     Head: Normocephalic.  Eyes:     Conjunctiva/sclera: Conjunctivae normal.  Pulmonary:     Effort: Pulmonary effort is normal.  Musculoskeletal:        General: Normal range of motion.     Cervical back: Normal range of motion.       Legs:     Comments: Right knee pain with flexion in areas highlighted.  No swelling or laxity   Skin:    General: Skin is warm and dry.     Findings: No rash.  Neurological:     Mental Status: She is alert.  Psychiatric:        Mood and Affect: Mood normal.      UC Treatments / Results  Labs (all labs ordered are listed, but only abnormal results are displayed) Labs Reviewed - No data to display  EKG   Radiology DG Knee Complete 4 Views Right  Result Date: 10/30/2020 CLINICAL DATA:  Right knee pain. EXAM: RIGHT KNEE - COMPLETE 4+ VIEW COMPARISON:  No prior. FINDINGS: No acute bony or joint abnormality. No evidence of fracture or dislocation. IMPRESSION:  No acute abnormality. Electronically Signed   By: Maisie Fus  Register   On: 10/30/2020 09:39    Procedures Procedures (including critical care time)  Medications Ordered in UC Medications - No data to  display  Initial Impression / Assessment and Plan / UC Course  I have reviewed the triage vital signs and the nursing notes.  Pertinent labs & imaging results that were available during my care of the patient were reviewed by me and considered in my medical decision making (see chart for details).     Right knee pain No acute findings on x ray.  Most likely tendinitis, arthritis.  Treating with meloxicam Knee brace provided here.  recommended RICE See ortho as needed.   Final Clinical Impressions(s) / UC Diagnoses   Final diagnoses:  Acute pain of right knee     Discharge Instructions     Your x ray was normal This may be a meniscus issue or arthritis.  I would recommend wearing a knee brace, resting , icing and elevating the knee.  Meloxicam daily for pain Follow up with orthopedics as needed.       ED Prescriptions    Medication Sig Dispense Auth. Provider   meloxicam (MOBIC) 7.5 MG tablet Take 1 tablet (7.5 mg total) by mouth daily. 30 tablet Dahlia Byes A, NP     PDMP not reviewed this encounter.   Dahlia Byes A, NP 10/30/20 1213

## 2020-10-30 NOTE — ED Triage Notes (Signed)
Pt presents today with right knee pain x3 days, denies injury, is ambulatory.

## 2020-10-30 NOTE — ED Triage Notes (Signed)
Pt reports having PCP appt tomorrow.

## 2020-10-30 NOTE — Discharge Instructions (Signed)
Your x ray was normal This may be a meniscus issue or arthritis.  I would recommend wearing a knee brace, resting , icing and elevating the knee.  Meloxicam daily for pain Follow up with orthopedics as needed.

## 2020-10-31 ENCOUNTER — Encounter: Payer: Commercial Managed Care - PPO | Admitting: Nurse Practitioner

## 2020-12-24 ENCOUNTER — Encounter: Payer: Commercial Managed Care - PPO | Admitting: Internal Medicine

## 2021-01-29 ENCOUNTER — Other Ambulatory Visit: Payer: Self-pay

## 2021-01-29 ENCOUNTER — Ambulatory Visit (INDEPENDENT_AMBULATORY_CARE_PROVIDER_SITE_OTHER): Payer: Commercial Managed Care - PPO | Admitting: Internal Medicine

## 2021-01-29 ENCOUNTER — Encounter: Payer: Self-pay | Admitting: Internal Medicine

## 2021-01-29 VITALS — BP 135/78 | HR 86 | Temp 97.7°F | Resp 17 | Ht 70.0 in | Wt 212.0 lb

## 2021-01-29 DIAGNOSIS — M545 Low back pain, unspecified: Secondary | ICD-10-CM

## 2021-01-29 DIAGNOSIS — E119 Type 2 diabetes mellitus without complications: Secondary | ICD-10-CM | POA: Diagnosis not present

## 2021-01-29 DIAGNOSIS — Z0001 Encounter for general adult medical examination with abnormal findings: Secondary | ICD-10-CM | POA: Diagnosis not present

## 2021-01-29 DIAGNOSIS — F419 Anxiety disorder, unspecified: Secondary | ICD-10-CM

## 2021-01-29 DIAGNOSIS — E1169 Type 2 diabetes mellitus with other specified complication: Secondary | ICD-10-CM

## 2021-01-29 DIAGNOSIS — G8929 Other chronic pain: Secondary | ICD-10-CM | POA: Insufficient documentation

## 2021-01-29 DIAGNOSIS — E782 Mixed hyperlipidemia: Secondary | ICD-10-CM

## 2021-01-29 MED ORDER — MELOXICAM 7.5 MG PO TABS: 7.5000 mg | ORAL_TABLET | Freq: Every day | ORAL | 0 refills | Status: AC

## 2021-01-29 MED ORDER — BUPROPION HCL ER (SR) 150 MG PO TB12: ORAL_TABLET | ORAL | 0 refills | Status: DC

## 2021-01-29 MED ORDER — METFORMIN HCL 850 MG PO TABS
ORAL_TABLET | ORAL | 0 refills | Status: DC
Start: 2021-01-29 — End: 2021-06-17

## 2021-01-29 NOTE — Progress Notes (Signed)
HPI  Patient presents the clinic today to establish care and for management of the conditions listed below.  DM2: Her last A1c was 8.9%, 12/2019.  She is not taking Metformin as prescribed because she is close to running out.  She does not check her sugars.  She checks her feet routinely. Flu 07/2020.  Pneumovax 05/2018.  COVID Moderna x 2  Anxiety: Managed on Buspar.she would like to go back on Wellbutrin. She is not seeing a therapist. She denies depression, SI/HI.  Chronic Low Back Pain: Managed on meloxicam.  She is actively working on weight loss.  Flu: 07/2020 Tetanus 08/2013 Pneumovax: 05/2018 COVID: Moderna x2 Pap Smear: 2021 Mammogram: 10/2019 Vision Screening: annually Dentist: biannually  Diet: She does eat meat. She consume fruits and veggies. She does not eat fried foods. She drinks mostly Coke Zero Exercise: Gym, walking 3 times weekly  Past Medical History:  Diagnosis Date  . Diabetes mellitus    type 2  . Low back pain 03/12/2011    Current Outpatient Medications  Medication Sig Dispense Refill  . atorvastatin (LIPITOR) 20 MG tablet Take 1 tablet (20 mg total) by mouth daily at 6 PM. 90 tablet 1  . buPROPion (WELLBUTRIN SR) 150 MG 12 hr tablet TAKE 1 TABLET(150 MG) BY MOUTH TWICE DAILY 180 tablet 2  . imiquimod (ALDARA) 5 % cream Apply topically 3 (three) times a week. Apply until total clearance or maximum of 16 weeks 24 each 5  . Insulin Pen Needle (PEN NEEDLES) 31G X 6 MM MISC 1 application by Does not apply route at bedtime. For Victoza pen 30 each 5  . liraglutide (VICTOZA) 18 MG/3ML SOPN Inject 1.8 mg into the skin daily. Need office visit for additional refills. 3 mL 2  . lisinopril (ZESTRIL) 5 MG tablet Take 0.5 tablets (2.5 mg total) by mouth daily. 45 tablet 1  . meloxicam (MOBIC) 7.5 MG tablet Take 1 tablet (7.5 mg total) by mouth daily. 30 tablet 0  . metFORMIN (GLUCOPHAGE) 850 MG tablet TAKE 1 TABLET(850 MG) BY MOUTH TWICE DAILY WITH A MEAL 180 tablet 0   . norethindrone-ethinyl estradiol-iron (BLISOVI FE 1.5/30) 1.5-30 MG-MCG tablet Take 1 tablet by mouth daily. Take active pills in a continuous fashion.  Skip placebo pills. 84 tablet 4   No current facility-administered medications for this visit.    No Known Allergies  Family History  Problem Relation Age of Onset  . Cancer Paternal Aunt 37       breast  . Cancer Other        breast  . Cancer Mother   . Breast cancer Mother 46    Social History   Socioeconomic History  . Marital status: Single    Spouse name: Not on file  . Number of children: 0  . Years of education: Not on file  . Highest education level: Not on file  Occupational History  . Occupation: Education officer, museum, Theme park manager care    Employer: [employer]  Tobacco Use  . Smoking status: Current Every Day Smoker    Packs/day: 1.00    Types: Cigarettes  . Smokeless tobacco: Never Used  Vaping Use  . Vaping Use: Never used  Substance and Sexual Activity  . Alcohol use: Yes    Alcohol/week: 0.0 standard drinks    Comment: occasional  . Drug use: No  . Sexual activity: Not Currently    Birth control/protection: Pill  Other Topics Concern  . Not on file  Social  History Narrative  . Not on file   Social Determinants of Health   Financial Resource Strain: Not on file  Food Insecurity: Not on file  Transportation Needs: Not on file  Physical Activity: Not on file  Stress: Not on file  Social Connections: Not on file  Intimate Partner Violence: Not on file    ROS:  Constitutional: Denies fever, malaise, fatigue, headache or abrupt weight changes.  HEENT: Denies eye pain, eye redness, ear pain, ringing in the ears, wax buildup, runny nose, nasal congestion, bloody nose, or sore throat. Respiratory: Denies difficulty breathing, shortness of breath, cough or sputum production.   Cardiovascular: Denies chest pain, chest tightness, palpitations or swelling in the hands or feet.  Gastrointestinal: Denies  abdominal pain, bloating, constipation, diarrhea or blood in the stool.  GU: Denies frequency, urgency, pain with urination, blood in urine, odor or discharge. Musculoskeletal: Patient reports intermittent low back pain.  Denies decrease in range of motion, difficulty with gait, muscle pain or joint pain and swelling.  Skin: Denies redness, rashes, lesions or ulcercations.  Neurological: Pt reports insomnia. Denies dizziness, difficulty with memory, difficulty with speech or problems with balance and coordination.  Psych: Pt reports anxiety. Denies depression, SI/HI.  No other specific complaints in a complete review of systems (except as listed in HPI above).  PE:  BP 135/78 (BP Location: Right Arm, Patient Position: Sitting, Cuff Size: Normal)   Pulse 86   Temp 97.7 F (36.5 C) (Temporal)   Resp 17   Ht '5\' 10"'  (1.778 m)   Wt 212 lb (96.2 kg)   SpO2 100%   BMI 30.42 kg/m   Wt Readings from Last 3 Encounters:  10/30/20 220 lb (99.8 kg)  01/09/20 221 lb 6.4 oz (100.4 kg)  12/15/19 221 lb (100.2 kg)    General: Appears her stated age, obese, in NAD. HEENT: Head: normal shape and size; Eyes: sclera white and EOMs intact;  Neck: Neck supple, trachea midline. No masses, lumps or thyromegaly present.  Cardiovascular: Normal rate and rhythm. S1,S2 noted.  No murmur, rubs or gallops noted. No JVD or BLE edema.  Pulmonary/Chest: Normal effort and positive vesicular breath sounds. No respiratory distress. No wheezes, rales or ronchi noted.  Abdomen: Soft and nontender. Normal bowel sounds, no bruits noted. No distention or masses noted. Liver, spleen and kidneys non palpable. Musculoskeletal: Strength 5/5 BUE/BLE. No difficulty with gait.  Neurological: Alert and oriented. Cranial nerves II-XII grossly intact. Coordination normal.  Psychiatric: Mood and affect mildly flat. Behavior is normal. Judgment and thought content normal.     BMET    Component Value Date/Time   NA 135  10/11/2019 0915   K 4.6 10/11/2019 0915   CL 99 10/11/2019 0915   CO2 28 10/11/2019 0915   GLUCOSE 222 (H) 10/11/2019 0915   BUN 12 10/11/2019 0915   CREATININE 0.82 10/11/2019 0915   CALCIUM 9.6 10/11/2019 0915    Lipid Panel     Component Value Date/Time   CHOL 122 01/09/2020 0910   TRIG 101.0 01/09/2020 0910   HDL 27.00 (L) 01/09/2020 0910   CHOLHDL 5 01/09/2020 0910   VLDL 20.2 01/09/2020 0910   LDLCALC 75 01/09/2020 0910    CBC    Component Value Date/Time   WBC 9.1 01/09/2020 0910   RBC 5.12 (H) 01/09/2020 0910   HGB 14.5 01/09/2020 0910   HCT 43.2 01/09/2020 0910   PLT 264.0 01/09/2020 0910   MCV 84.3 01/09/2020 0910   MCHC 33.7  01/09/2020 0910   RDW 13.3 01/09/2020 0910   LYMPHSABS 2.2 01/09/2020 0910   MONOABS 0.3 01/09/2020 0910   EOSABS 0.0 01/09/2020 0910   BASOSABS 0.0 01/09/2020 0910    Hgb A1C Lab Results  Component Value Date   HGBA1C 8.9 (H) 01/09/2020     Assessment and Plan:  Preventative Health Maintenance:  Encouraged her to get a flu shot in fall Tetanus UTD Encouraged her to get her COVID booster Pap smear UTD She will call to schedule her mammogram Encouraged her to consume a balanced diet and exercise regimen Advised her to see an eye doctor and dentist annually We will check CBC, c-Met, lipid, TSH, A1c today  RTC in 3 months, follow-up chronic conditions   Webb Silversmith, NP This visit occurred during the SARS-CoV-2 public health emergency.  Safety protocols were in place, including screening questions prior to the visit, additional usage of staff PPE, and extensive cleaning of exam room while observing appropriate contact time as indicated for disinfecting solutions.

## 2021-01-29 NOTE — Assessment & Plan Note (Signed)
Will restart Wellbutrin Encouraged her to use BuSpar as needed, not scheduled Support offered

## 2021-01-29 NOTE — Assessment & Plan Note (Signed)
C-Met, lipid and A1c today Encouraged her to consume a low-carb diet and exercise for weight loss Continue metformin for now Encourage routine eye exams Encourage routine foot exams Encouraged her to get a flu shot the fall Pneumovax UTD Encouraged her to get her COVID booster

## 2021-01-29 NOTE — Assessment & Plan Note (Signed)
Encourage regular stretching and physical activity Encourage exercise for weight loss and core strengthening Continue Meloxicam as needed

## 2021-01-29 NOTE — Patient Instructions (Signed)
Health Maintenance, Female Adopting a healthy lifestyle and getting preventive care are important in promoting health and wellness. Ask your health care provider about:  The right schedule for you to have regular tests and exams.  Things you can do on your own to prevent diseases and keep yourself healthy. What should I know about diet, weight, and exercise? Eat a healthy diet  Eat a diet that includes plenty of vegetables, fruits, low-fat dairy products, and lean protein.  Do not eat a lot of foods that are high in solid fats, added sugars, or sodium.   Maintain a healthy weight Body mass index (BMI) is used to identify weight problems. It estimates body fat based on height and weight. Your health care provider can help determine your BMI and help you achieve or maintain a healthy weight. Get regular exercise Get regular exercise. This is one of the most important things you can do for your health. Most adults should:  Exercise for at least 150 minutes each week. The exercise should increase your heart rate and make you sweat (moderate-intensity exercise).  Do strengthening exercises at least twice a week. This is in addition to the moderate-intensity exercise.  Spend less time sitting. Even light physical activity can be beneficial. Watch cholesterol and blood lipids Have your blood tested for lipids and cholesterol at 42 years of age, then have this test every 5 years. Have your cholesterol levels checked more often if:  Your lipid or cholesterol levels are high.  You are older than 42 years of age.  You are at high risk for heart disease. What should I know about cancer screening? Depending on your health history and family history, you may need to have cancer screening at various ages. This may include screening for:  Breast cancer.  Cervical cancer.  Colorectal cancer.  Skin cancer.  Lung cancer. What should I know about heart disease, diabetes, and high blood  pressure? Blood pressure and heart disease  High blood pressure causes heart disease and increases the risk of stroke. This is more likely to develop in people who have high blood pressure readings, are of African descent, or are overweight.  Have your blood pressure checked: ? Every 3-5 years if you are 18-39 years of age. ? Every year if you are 40 years old or older. Diabetes Have regular diabetes screenings. This checks your fasting blood sugar level. Have the screening done:  Once every three years after age 40 if you are at a normal weight and have a low risk for diabetes.  More often and at a younger age if you are overweight or have a high risk for diabetes. What should I know about preventing infection? Hepatitis B If you have a higher risk for hepatitis B, you should be screened for this virus. Talk with your health care provider to find out if you are at risk for hepatitis B infection. Hepatitis C Testing is recommended for:  Everyone born from 1945 through 1965.  Anyone with known risk factors for hepatitis C. Sexually transmitted infections (STIs)  Get screened for STIs, including gonorrhea and chlamydia, if: ? You are sexually active and are younger than 42 years of age. ? You are older than 42 years of age and your health care provider tells you that you are at risk for this type of infection. ? Your sexual activity has changed since you were last screened, and you are at increased risk for chlamydia or gonorrhea. Ask your health care provider   if you are at risk.  Ask your health care provider about whether you are at high risk for HIV. Your health care provider may recommend a prescription medicine to help prevent HIV infection. If you choose to take medicine to prevent HIV, you should first get tested for HIV. You should then be tested every 3 months for as long as you are taking the medicine. Pregnancy  If you are about to stop having your period (premenopausal) and  you may become pregnant, seek counseling before you get pregnant.  Take 400 to 800 micrograms (mcg) of folic acid every day if you become pregnant.  Ask for birth control (contraception) if you want to prevent pregnancy. Osteoporosis and menopause Osteoporosis is a disease in which the bones lose minerals and strength with aging. This can result in bone fractures. If you are 65 years old or older, or if you are at risk for osteoporosis and fractures, ask your health care provider if you should:  Be screened for bone loss.  Take a calcium or vitamin D supplement to lower your risk of fractures.  Be given hormone replacement therapy (HRT) to treat symptoms of menopause. Follow these instructions at home: Lifestyle  Do not use any products that contain nicotine or tobacco, such as cigarettes, e-cigarettes, and chewing tobacco. If you need help quitting, ask your health care provider.  Do not use street drugs.  Do not share needles.  Ask your health care provider for help if you need support or information about quitting drugs. Alcohol use  Do not drink alcohol if: ? Your health care provider tells you not to drink. ? You are pregnant, may be pregnant, or are planning to become pregnant.  If you drink alcohol: ? Limit how much you use to 0-1 drink a day. ? Limit intake if you are breastfeeding.  Be aware of how much alcohol is in your drink. In the [place] one drink equals one 12 oz bottle of beer (355 mL), one 5 oz glass of wine (148 mL), or one 1 oz glass of hard liquor (44 mL). General instructions  Schedule regular health, dental, and eye exams.  Stay current with your vaccines.  Tell your health care provider if: ? You often feel depressed. ? You have ever been abused or do not feel safe at home. Summary  Adopting a healthy lifestyle and getting preventive care are important in promoting health and wellness.  Follow your health care provider's instructions about healthy  diet, exercising, and getting tested or screened for diseases.  Follow your health care provider's instructions on monitoring your cholesterol and blood pressure. This information is not intended to replace advice given to you by your health care provider. Make sure you discuss any questions you have with your health care provider. Document Revised: 08/04/2018 Document Reviewed: 08/04/2018 Elsevier Patient Education  2021 Elsevier Inc.  

## 2021-01-30 LAB — COMPLETE METABOLIC PANEL WITH GFR
AG Ratio: 1.6 (calc) (ref 1.0–2.5)
ALT: 10 U/L (ref 6–29)
AST: 10 U/L (ref 10–30)
Albumin: 4.5 g/dL (ref 3.6–5.1)
Alkaline phosphatase (APISO): 58 U/L (ref 31–125)
BUN: 9 mg/dL (ref 7–25)
CO2: 23 mmol/L (ref 20–32)
Calcium: 9.8 mg/dL (ref 8.6–10.2)
Chloride: 103 mmol/L (ref 98–110)
Creat: 0.75 mg/dL (ref 0.50–1.10)
GFR, Est African American: 115 mL/min/{1.73_m2} (ref >=60)
GFR, Est Non African American: 99 mL/min/{1.73_m2} (ref >=60)
Globulin: 2.9 g/dL (calc) (ref 1.9–3.7)
Glucose, Bld: 237 mg/dL — ABNORMAL HIGH (ref 65–99)
Potassium: 4.6 mmol/L (ref 3.5–5.3)
Sodium: 135 mmol/L (ref 135–146)
Total Bilirubin: 0.4 mg/dL (ref 0.2–1.2)
Total Protein: 7.4 g/dL (ref 6.1–8.1)

## 2021-01-30 LAB — CBC
HCT: 48.9 % — ABNORMAL HIGH (ref 35.0–45.0)
Hemoglobin: 16.3 g/dL — ABNORMAL HIGH (ref 11.7–15.5)
MCH: 28.1 pg (ref 27.0–33.0)
MCHC: 33.3 g/dL (ref 32.0–36.0)
MCV: 84.3 fL (ref 80.0–100.0)
MPV: 11.3 fL (ref 7.5–12.5)
Platelets: 331 10*3/uL (ref 140–400)
RBC: 5.8 10*6/uL — ABNORMAL HIGH (ref 3.80–5.10)
RDW: 12.9 % (ref 11.0–15.0)
WBC: 11.7 10*3/uL — ABNORMAL HIGH (ref 3.8–10.8)

## 2021-01-30 LAB — HEMOGLOBIN A1C
Hgb A1c MFr Bld: 9.6 % of total Hgb — ABNORMAL HIGH (ref <5.7)
Mean Plasma Glucose: 229 mg/dL
eAG (mmol/L): 12.7 mmol/L

## 2021-01-30 LAB — LIPID PANEL
Cholesterol: 220 mg/dL — ABNORMAL HIGH (ref <200)
HDL: 34 mg/dL — ABNORMAL LOW (ref >=50)
LDL Cholesterol (Calc): 154 mg/dL (calc) — ABNORMAL HIGH
Non-HDL Cholesterol (Calc): 186 mg/dL (calc) — ABNORMAL HIGH (ref <130)
Total CHOL/HDL Ratio: 6.5 (calc) — ABNORMAL HIGH (ref <5.0)
Triglycerides: 186 mg/dL — ABNORMAL HIGH (ref <150)

## 2021-01-30 LAB — TSH: TSH: 1.84 mIU/L

## 2021-01-31 MED ORDER — ATORVASTATIN CALCIUM 20 MG PO TABS: 20.0000 mg | ORAL_TABLET | Freq: Every day | ORAL | 0 refills | Status: DC

## 2021-01-31 MED ORDER — GLIPIZIDE 10 MG PO TABS: 10.0000 mg | ORAL_TABLET | Freq: Two times a day (BID) | ORAL | 0 refills | Status: DC

## 2021-01-31 NOTE — Addendum Note (Signed)
Addended by: Lorre Munroe on: 01/31/2021 01:04 PM   Modules accepted: Orders

## 2021-02-24 ENCOUNTER — Other Ambulatory Visit: Payer: Self-pay | Admitting: Obstetrics & Gynecology

## 2021-02-24 DIAGNOSIS — Z3041 Encounter for surveillance of contraceptive pills: Secondary | ICD-10-CM

## 2021-04-22 ENCOUNTER — Other Ambulatory Visit: Payer: Self-pay | Admitting: Internal Medicine

## 2021-04-22 ENCOUNTER — Other Ambulatory Visit: Payer: Self-pay

## 2021-04-22 ENCOUNTER — Ambulatory Visit
Admission: RE | Admit: 2021-04-22 | Discharge: 2021-04-22 | Disposition: A | Discharge status: Home / Self Care | Payer: Commercial Managed Care - PPO | Source: Ambulatory Visit | Attending: Internal Medicine | Admitting: Internal Medicine

## 2021-04-22 DIAGNOSIS — Z1231 Encounter for screening mammogram for malignant neoplasm of breast: Secondary | ICD-10-CM

## 2021-04-26 ENCOUNTER — Encounter: Payer: Self-pay | Admitting: Internal Medicine

## 2021-05-10 ENCOUNTER — Other Ambulatory Visit: Payer: Self-pay | Admitting: *Deleted

## 2021-05-10 DIAGNOSIS — Z3041 Encounter for surveillance of contraceptive pills: Secondary | ICD-10-CM

## 2021-05-10 MED ORDER — NORETHIN ACE-ETH ESTRAD-FE 1.5-30 MG-MCG PO TABS: 1.0000 | ORAL_TABLET | Freq: Every day | ORAL | 4 refills | Status: DC

## 2021-05-28 ENCOUNTER — Other Ambulatory Visit: Payer: Self-pay

## 2021-05-28 ENCOUNTER — Encounter: Payer: Self-pay | Admitting: Obstetrics & Gynecology

## 2021-05-28 ENCOUNTER — Ambulatory Visit (INDEPENDENT_AMBULATORY_CARE_PROVIDER_SITE_OTHER): Payer: Commercial Managed Care - PPO | Admitting: Obstetrics & Gynecology

## 2021-05-28 ENCOUNTER — Other Ambulatory Visit (HOSPITAL_COMMUNITY)
Admission: RE | Admit: 2021-05-28 | Discharge: 2021-05-28 | Disposition: A | Discharge status: Home / Self Care | Payer: Commercial Managed Care - PPO | Source: Ambulatory Visit | Attending: Obstetrics & Gynecology | Admitting: Obstetrics & Gynecology

## 2021-05-28 VITALS — BP 134/85 | HR 94 | Ht 70.0 in | Wt 213.8 lb

## 2021-05-28 DIAGNOSIS — D069 Carcinoma in situ of cervix, unspecified: Secondary | ICD-10-CM | POA: Diagnosis present

## 2021-05-28 DIAGNOSIS — Z9889 Other specified postprocedural states: Secondary | ICD-10-CM

## 2021-05-28 DIAGNOSIS — Z124 Encounter for screening for malignant neoplasm of cervix: Secondary | ICD-10-CM

## 2021-05-28 NOTE — Progress Notes (Signed)
   GYNECOLOGY OFFICE VISIT NOTE  History:   Hannah Foley is a 40 y.o. G0P0000 here today for follow up pap smear.  Had LEEP for CIN III in 11/2019, pathology came back with negative margins. Supposed to have follow up pap in 12 and 24 months.   She denies any abnormal vaginal discharge, bleeding, pelvic pain or other concerns.    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   Review of Systems:  Pertinent items noted in HPI and remainder of comprehensive ROS otherwise negative.  Physical Exam:  BP 134/85   Pulse 94   Ht 5\' 10"  (1.778 m)   Wt 213 lb 12.8 oz (97 kg)   BMI 30.68 kg/m  CONSTITUTIONAL: Well-developed, well-nourished female in no acute distress.  HEENT:  Normocephalic, atraumatic. External right and left ear normal. No scleral icterus.  NECK: Normal range of motion, supple, no masses noted on observation SKIN: No rash noted. Not diaphoretic. No erythema. No pallor. MUSCULOSKELETAL: Normal range of motion. No edema noted. NEUROLOGIC: Alert and oriented to person, place, and time. Normal muscle tone coordination. No cranial nerve deficit noted. PSYCHIATRIC: Normal mood and affect. Normal behavior. Normal judgment and thought content. CARDIOVASCULAR: Normal heart rate noted RESPIRATORY: Effort and breath sounds normal, no problems with respiration noted ABDOMEN: No masses noted. No other overt distention noted.   PELVIC: Normal appearing external genitalia; normal urethral meatus; normal  appearing vaginal mucosa and cervix.  No abnormal discharge noted.  Pap smear done.  Performed in the presence of a chaperone     Assessment and Plan:     1. Pap smear for cervical cancer screening 2. S/P LEEP 3. Cervical intraepithelial neoplasia grade III with severe dysplasia - Cytology - PAP done, will follow up results and manage accordingly. Routine preventative health maintenance measures emphasized. Please refer to After Visit Summary for other counseling recommendations.   Return for any gynecologic concerns.    I spent 10 minutes dedicated to the care of this patient including pre-visit review of records, face to face time with the patient discussing her conditions and treatments and post visit orders.    , MD, FACOG Obstetrician & Gynecologist, [facility]

## 2021-06-03 LAB — CYTOLOGY - PAP
Chlamydia: NEGATIVE
Comment: NEGATIVE
Comment: NEGATIVE
Comment: NEGATIVE
Comment: NORMAL
Diagnosis: UNDETERMINED — AB
High risk HPV: NEGATIVE
Neisseria Gonorrhea: NEGATIVE
Trichomonas: NEGATIVE

## 2021-06-17 ENCOUNTER — Other Ambulatory Visit: Payer: Self-pay | Admitting: Internal Medicine

## 2021-06-17 DIAGNOSIS — E1169 Type 2 diabetes mellitus with other specified complication: Secondary | ICD-10-CM

## 2021-06-17 MED ORDER — METFORMIN HCL 850 MG PO TABS: ORAL_TABLET | ORAL | 0 refills | Status: DC

## 2021-07-20 IMAGING — MG DIGITAL SCREENING BILAT W/ CAD
4 series · 4 of 4 positions shown · non-contrast
Comparison: Previous exam(s).

CLINICAL DATA: Screening.

EXAM:
DIGITAL SCREENING BILATERAL MAMMOGRAM WITH CAD

[R CC]
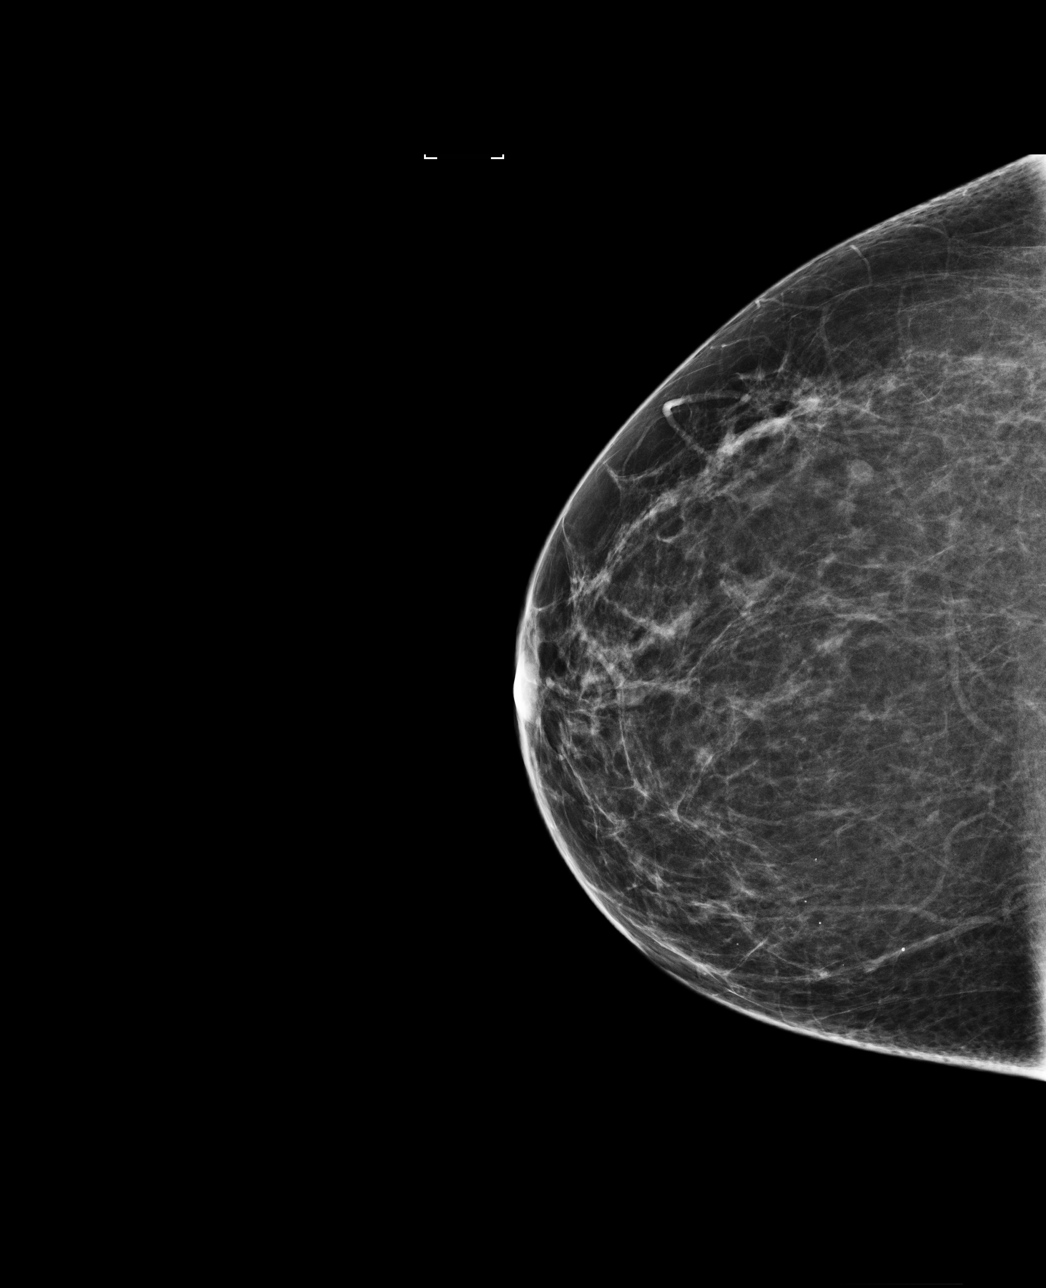

[L CC]
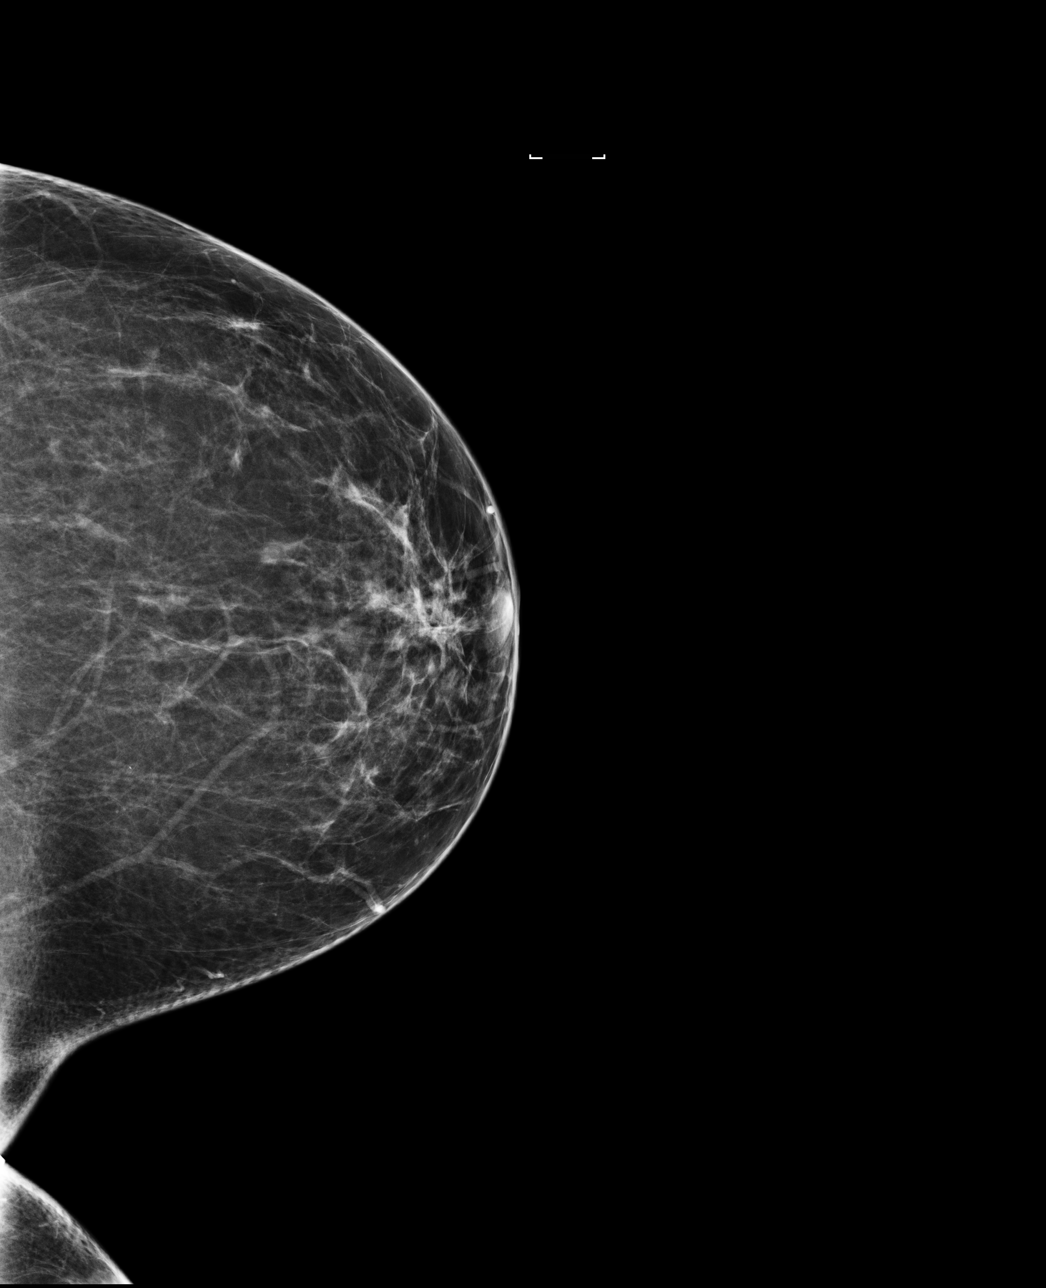

[R MLO]
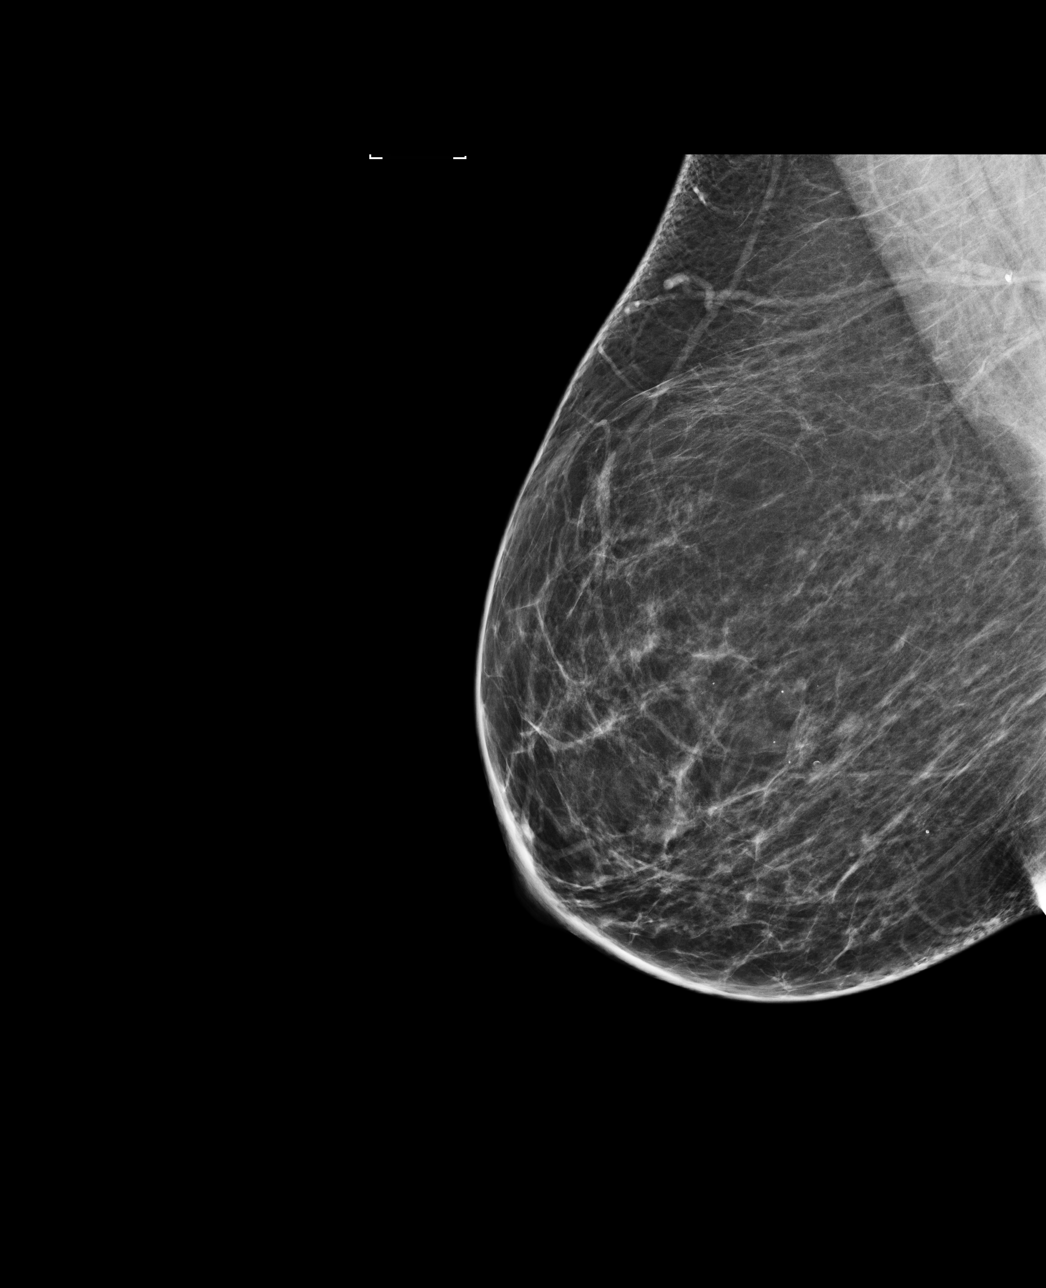

[L MLO]
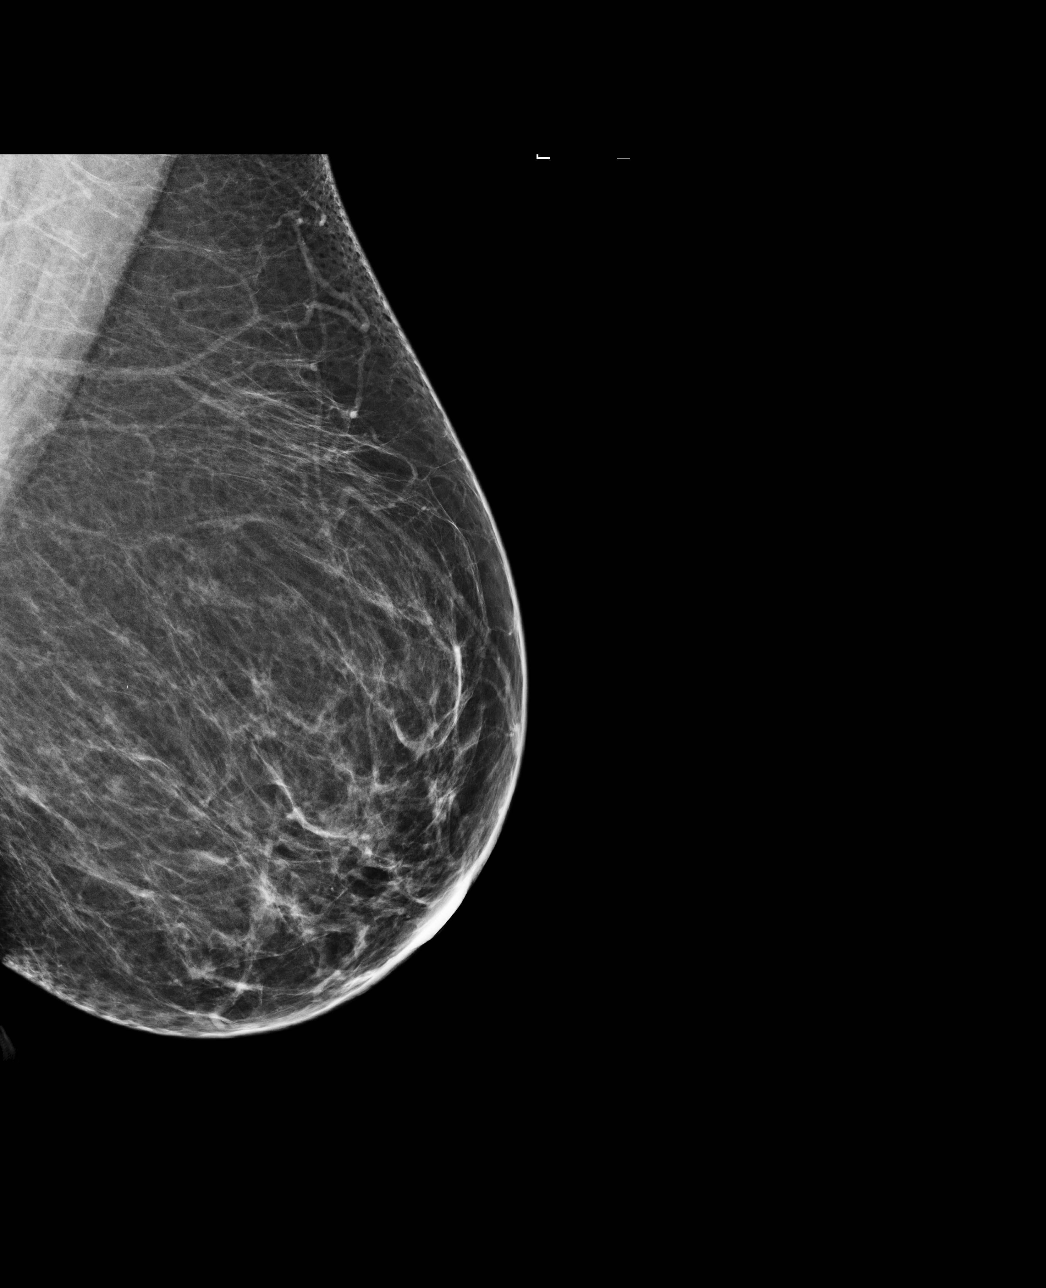

[4 of 4 positions shown; findings below may reference images not displayed]

ACR Breast Density Category b: There are scattered areas of
fibroglandular density.
FINDINGS: There are no findings suspicious for malignancy. Images were
processed with CAD.
IMPRESSION: No mammographic evidence of malignancy. A result letter of this
screening mammogram will be mailed directly to the patient.

RECOMMENDATION:
Screening mammogram in one year. (Code:AS-G-LCT)

BI-RADS CATEGORY  1: Negative.

## 2021-08-05 ENCOUNTER — Other Ambulatory Visit: Payer: Self-pay | Admitting: Internal Medicine

## 2021-08-05 ENCOUNTER — Encounter: Payer: Self-pay | Admitting: Internal Medicine

## 2021-08-05 MED ORDER — BUPROPION HCL ER (SR) 150 MG PO TB12: ORAL_TABLET | ORAL | 0 refills | Status: DC

## 2021-08-06 NOTE — Telephone Encounter (Signed)
medication was reordered yesterday by Nicki Reaper NP  Pt has upcoming appt Requested Prescriptions  Refused Prescriptions Disp Refills  . buPROPion (WELLBUTRIN SR) 150 MG 12 hr tablet [Pharmacy Med Name: BUPROPION SR 150MG  TABLETS (12 H)] 180 tablet 0    Sig: TAKE 1 TABLET(150 MG) BY MOUTH TWICE DAILY     Psychiatry: Antidepressants - bupropion Failed - 08/05/2021  1:54 PM      Failed - Valid encounter within last 6 months    Recent Outpatient Visits          6 months ago Encounter for general adult medical examination with abnormal findings   [facility], [place], NP      Future Appointments            In 2 days Salvadore Oxford, NP [facility]           Passed - Last BP in normal range    BP Readings from Last 1 Encounters:  05/28/21 134/85

## 2021-08-08 ENCOUNTER — Ambulatory Visit: Payer: Commercial Managed Care - PPO | Admitting: Internal Medicine

## 2021-08-08 ENCOUNTER — Other Ambulatory Visit: Payer: Self-pay

## 2021-08-08 ENCOUNTER — Encounter: Payer: Self-pay | Admitting: Internal Medicine

## 2021-08-08 VITALS — BP 138/70 | HR 92 | Temp 97.9°F | Resp 17 | Ht 70.0 in | Wt 218.2 lb

## 2021-08-08 DIAGNOSIS — Z6831 Body mass index (BMI) 31.0-31.9, adult: Secondary | ICD-10-CM

## 2021-08-08 DIAGNOSIS — F419 Anxiety disorder, unspecified: Secondary | ICD-10-CM

## 2021-08-08 DIAGNOSIS — M545 Low back pain, unspecified: Secondary | ICD-10-CM

## 2021-08-08 DIAGNOSIS — G8929 Other chronic pain: Secondary | ICD-10-CM

## 2021-08-08 DIAGNOSIS — E6609 Other obesity due to excess calories: Secondary | ICD-10-CM

## 2021-08-08 DIAGNOSIS — E782 Mixed hyperlipidemia: Secondary | ICD-10-CM

## 2021-08-08 DIAGNOSIS — E1165 Type 2 diabetes mellitus with hyperglycemia: Secondary | ICD-10-CM | POA: Diagnosis not present

## 2021-08-08 NOTE — Progress Notes (Signed)
Subjective:    Patient ID: Hannah Foley, female    DOB: 1981-04-12, 40 y.o.   MRN: [id number]  HPI  Pt presents to the clinic today for follow up of chronic conditions  Anxiety: Chronic, managed on Bupropion. She is not currently seeing a therapist. She denies depression, SI/HI.  DM 2: Her last A1C was 9.6%, 01/2021. She is taking Metformin and Glipizide as prescribed. She does not check her sugars. Her last eye exam was more than 1 year ago. She checks her feet routinely. Flu 05/2021. Pneumovax 05/2018. Covid Moderna x 2  Chronic Low Back Pain: Managed with Meloxicam as needed. She does not follow with orthopedics.  HLD: Her last LDL was 158, triglycerides 186, 01/2021. She is not taking Atorvastatin as prescribed. She tries to consume a low fat diet.  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 90 tablet 0   buPROPion (WELLBUTRIN SR) 150 MG 12 hr tablet TAKE 1 TABLET(150 MG) BY MOUTH TWICE DAILY 180 tablet 0   busPIRone (BUSPAR) 5 MG tablet Take 5 mg by mouth 2 (two) times daily as needed. (Patient not taking: Reported on 05/28/2021)     glipiZIDE (GLUCOTROL) 10 MG tablet Take 1 tablet (10 mg total) by mouth 2 (two) times daily before a meal. 180 tablet 0   meloxicam (MOBIC) 7.5 MG tablet Take 1 tablet (7.5 mg total) by mouth daily. 90 tablet 0   metFORMIN (GLUCOPHAGE) 850 MG tablet TAKE 1 TABLET(850 MG) BY MOUTH TWICE DAILY WITH A MEAL 180 tablet 0   norethindrone-ethinyl estradiol-iron (JUNEL FE 1.5/30) 1.5-30 MG-MCG tablet Take 1 tablet by mouth daily. Pt takes continuously 84 tablet 4   No current facility-administered medications for this visit.    No Known Allergies  Family History  Problem  Relation Age of Onset   Cancer Paternal Aunt 13       breast   Cancer Other        breast   Cancer Mother    Breast cancer Mother 30    Social History   Socioeconomic History   Marital status: Single    Spouse name: Not on file   Number of children: 0   Years of education: Not on file   Highest education level: Not on file  Occupational History   Occupation: Child psychotherapist, Airline pilot memory care    Employer: [employer]  Tobacco Use   Smoking status: Every Day    Packs/day: 1.00    Types: Cigarettes   Smokeless tobacco: Never  Vaping Use   Vaping Use: Never used  Substance and Sexual Activity   Alcohol use: Yes    Alcohol/week: 0.0 standard drinks    Comment: occasional   Drug use: No   Sexual activity: Not Currently    Birth control/protection: Pill  Other Topics Concern   Not on file  Social History Narrative   Not on file   Social Determinants of Health   Financial Resource Strain: Not on file  Food Insecurity: Not on file  Transportation Needs: Not on file  Physical Activity: Not on file  Stress: Not on file  Social Connections: Not  on file  Intimate Partner Violence: Not on file     Constitutional: Denies fever, malaise, fatigue, headache or abrupt weight changes.  HEENT: Denies eye pain, eye redness, ear pain, ringing in the ears, wax buildup, runny nose, nasal congestion, bloody nose, or sore throat. Respiratory: Denies difficulty breathing, shortness of breath, cough or sputum production.   Cardiovascular: Denies chest pain, chest tightness, palpitations or swelling in the hands or feet.  Gastrointestinal: Denies abdominal pain, bloating, constipation, diarrhea or blood in the stool.  GU: Denies urgency, frequency, pain with urination, burning sensation, blood in urine, odor or discharge. Musculoskeletal: Pt reports chronic low back pain. Denies decrease in range of motion, difficulty with gait, or joint swelling.  Skin: Denies redness, rashes, lesions  or ulcercations.  Neurological: Denies dizziness, difficulty with memory, difficulty with speech or problems with balance and coordination.  Psych: Pt has a history of anxiety. Denies depression, SI/HI.  No other specific complaints in a complete review of systems (except as listed in HPI above).  Objective:   Physical Exam  BP 138/70    Pulse 92    Temp 97.9 F (36.6 C) (Temporal)    Resp 17    Ht 5\' 10"  (1.778 m)    Wt 218 lb 3.2 oz (99 kg)    LMP 07/25/2021    SpO2 100%    BMI 31.31 kg/m   Wt Readings from Last 3 Encounters:  05/28/21 213 lb 12.8 oz (97 kg)  01/29/21 212 lb (96.2 kg)  10/30/20 220 lb (99.8 kg)    General: Appears her stated age, obese, in NAD. Skin: Warm, dry and intact. No ulcerations noted. HEENT: Head: normal shape and size; Eyes: sclera white and EOMs intact;  Cardiovascular: Normal rate and rhythm. S1,S2 noted.  No murmur, rubs or gallops noted. No JVD or BLE edema.  Pulmonary/Chest: Normal effort and positive vesicular breath sounds. No respiratory distress. No wheezes, rales or ronchi noted.  Musculoskeletal: No difficulty with gait.  Neurological: Alert and oriented.    BMET    Component Value Date/Time   NA 135 01/29/2021 1017   K 4.6 01/29/2021 1017   CL 103 01/29/2021 1017   CO2 23 01/29/2021 1017   GLUCOSE 237 (H) 01/29/2021 1017   BUN 9 01/29/2021 1017   CREATININE 0.75 01/29/2021 1017   CALCIUM 9.8 01/29/2021 1017   GFRNONAA 99 01/29/2021 1017   GFRAA 115 01/29/2021 1017    Lipid Panel     Component Value Date/Time   CHOL 220 (H) 01/29/2021 1017   TRIG 186 (H) 01/29/2021 1017   HDL 34 (L) 01/29/2021 1017   CHOLHDL 6.5 (H) 01/29/2021 1017   VLDL 20.2 01/09/2020 0910   LDLCALC 154 (H) 01/29/2021 1017    CBC    Component Value Date/Time   WBC 11.7 (H) 01/29/2021 1017   RBC 5.80 (H) 01/29/2021 1017   HGB 16.3 (H) 01/29/2021 1017   HCT 48.9 (H) 01/29/2021 1017   PLT 331 01/29/2021 1017   MCV 84.3 01/29/2021 1017   MCH 28.1  01/29/2021 1017   MCHC 33.3 01/29/2021 1017   RDW 12.9 01/29/2021 1017   LYMPHSABS 2.2 01/09/2020 0910   MONOABS 0.3 01/09/2020 0910   EOSABS 0.0 01/09/2020 0910   BASOSABS 0.0 01/09/2020 0910    Hgb A1C Lab Results  Component Value Date   HGBA1C 9.6 (H) 01/29/2021           Assessment & Plan:     03/31/2021, NP This  visit occurred during the SARS-CoV-2 public health emergency.  Safety protocols were in place, including screening questions prior to the visit, additional usage of staff PPE, and extensive cleaning of exam room while observing appropriate contact time as indicated for disinfecting solutions.

## 2021-08-09 ENCOUNTER — Encounter: Payer: Self-pay | Admitting: Internal Medicine

## 2021-08-09 DIAGNOSIS — Z6828 Body mass index (BMI) 28.0-28.9, adult: Secondary | ICD-10-CM | POA: Insufficient documentation

## 2021-08-09 DIAGNOSIS — E663 Overweight: Secondary | ICD-10-CM | POA: Insufficient documentation

## 2021-08-09 DIAGNOSIS — E785 Hyperlipidemia, unspecified: Secondary | ICD-10-CM | POA: Insufficient documentation

## 2021-08-09 DIAGNOSIS — E6609 Other obesity due to excess calories: Secondary | ICD-10-CM | POA: Insufficient documentation

## 2021-08-09 LAB — COMPLETE METABOLIC PANEL WITH GFR
AG Ratio: 1.6 (calc) (ref 1.0–2.5)
ALT: 13 U/L (ref 6–29)
AST: 14 U/L (ref 10–30)
Albumin: 4.1 g/dL (ref 3.6–5.1)
Alkaline phosphatase (APISO): 59 U/L (ref 31–125)
BUN: 16 mg/dL (ref 7–25)
CO2: 26 mmol/L (ref 20–32)
Calcium: 10 mg/dL (ref 8.6–10.2)
Chloride: 98 mmol/L (ref 98–110)
Creat: 0.94 mg/dL (ref 0.50–0.99)
Globulin: 2.6 g/dL (calc) (ref 1.9–3.7)
Glucose, Bld: 249 mg/dL — ABNORMAL HIGH (ref 65–139)
Potassium: 4.8 mmol/L (ref 3.5–5.3)
Sodium: 135 mmol/L (ref 135–146)
Total Bilirubin: 0.4 mg/dL (ref 0.2–1.2)
Total Protein: 6.7 g/dL (ref 6.1–8.1)
eGFR: 78 mL/min/{1.73_m2} (ref >=60)

## 2021-08-09 LAB — LIPID PANEL
Cholesterol: 201 mg/dL — ABNORMAL HIGH (ref <200)
HDL: 40 mg/dL — ABNORMAL LOW (ref >=50)
LDL Cholesterol (Calc): 125 mg/dL (calc) — ABNORMAL HIGH
Non-HDL Cholesterol (Calc): 161 mg/dL (calc) — ABNORMAL HIGH (ref <130)
Total CHOL/HDL Ratio: 5 (calc) — ABNORMAL HIGH (ref <5.0)
Triglycerides: 218 mg/dL — ABNORMAL HIGH (ref <150)

## 2021-08-09 LAB — MICROALBUMIN / CREATININE URINE RATIO
Creatinine, Urine: 66 mg/dL (ref 20–275)
Microalb Creat Ratio: 15 mcg/mg creat (ref <30)
Microalb, Ur: 1 mg/dL

## 2021-08-09 LAB — HEMOGLOBIN A1C
Hgb A1c MFr Bld: 10.1 % of total Hgb — ABNORMAL HIGH (ref <5.7)
Mean Plasma Glucose: 243 mg/dL
eAG (mmol/L): 13.5 mmol/L

## 2021-08-09 NOTE — Assessment & Plan Note (Signed)
C-Met and lipid profile today Encouraged her to consume a low-fat diet Will need to restart Atorvastatin if LDL >100

## 2021-08-09 NOTE — Patient Instructions (Signed)
Heart-Healthy Eating Plan Heart-healthy meal planning includes: Eating less unhealthy fats. Eating more healthy fats. Making other changes in your diet. Talk with your doctor or a diet specialist (dietitian) to create an eating plan that is right for you. What is my plan? Your doctor may recommend an eating plan that includes: Total fat: ______% or less of total calories a day. Saturated fat: ______% or less of total calories a day. Cholesterol: less than _________mg a day. What are tips for following this plan? Cooking Avoid frying your food. Try to bake, boil, grill, or broil it instead. You can also reduce fat by: Removing the skin from poultry. Removing all visible fats from meats. Steaming vegetables in water or broth. Meal planning  At meals, divide your plate into four equal parts: Fill one-half of your plate with vegetables and green salads. Fill one-fourth of your plate with whole grains. Fill one-fourth of your plate with lean protein foods. Eat 4-5 servings of vegetables per day. A serving of vegetables is: 1 cup of raw or cooked vegetables. 2 cups of raw leafy greens. Eat 4-5 servings of fruit per day. A serving of fruit is: 1 medium whole fruit.  cup of dried fruit.  cup of fresh, frozen, or canned fruit.  cup of 100% fruit juice. Eat more foods that have soluble fiber. These are apples, broccoli, carrots, beans, peas, and barley. Try to get 20-30 g of fiber per day. Eat 4-5 servings of nuts, legumes, and seeds per week: 1 serving of dried beans or legumes equals  cup after being cooked. 1 serving of nuts is  cup. 1 serving of seeds equals 1 tablespoon. General information Eat more home-cooked food. Eat less restaurant, buffet, and fast food. Limit or avoid alcohol. Limit foods that are high in starch and sugar. Avoid fried foods. Lose weight if you are overweight. Keep track of how much salt (sodium) you eat. This is important if you have high blood  pressure. Ask your doctor to tell you more about this. Try to add vegetarian meals each week. Fats Choose healthy fats. These include olive oil and canola oil, flaxseeds, walnuts, almonds, and seeds. Eat more omega-3 fats. These include salmon, mackerel, sardines, tuna, flaxseed oil, and ground flaxseeds. Try to eat fish at least 2 times each week. Check food labels. Avoid foods with trans fats or high amounts of saturated fat. Limit saturated fats. These are often found in animal products, such as meats, butter, and cream. These are also found in plant foods, such as palm oil, palm kernel oil, and coconut oil. Avoid foods with partially hydrogenated oils in them. These have trans fats. Examples are stick margarine, some tub margarines, cookies, crackers, and other baked goods. What foods can I eat? Fruits All fresh, canned (in natural juice), or frozen fruits. Vegetables Fresh or frozen vegetables (raw, steamed, roasted, or grilled). Green salads. Grains Most grains. Choose whole wheat and whole grains most of the time. Rice and pasta, including brown rice and pastas made with whole wheat. Meats and other proteins Lean, well-trimmed beef, veal, pork, and lamb. Chicken and turkey without skin. All fish and shellfish. Wild duck, rabbit, pheasant, and venison. Egg whites or low-cholesterol egg substitutes. Dried beans, peas, lentils, and tofu. Seeds and most nuts. Dairy Low-fat or nonfat cheeses, including ricotta and mozzarella. Skim or 1% milk that is liquid, powdered, or evaporated. Buttermilk that is made with low-fat milk. Nonfat or low-fat yogurt. Fats and oils Non-hydrogenated (trans-free) margarines. Vegetable oils, including   soybean, sesame, sunflower, olive, peanut, safflower, corn, canola, and cottonseed. Salad dressings or mayonnaise made with a vegetable oil. Beverages Mineral water. Coffee and tea. Diet carbonated beverages. Sweets and desserts Sherbet, gelatin, and fruit ice.  Small amounts of dark chocolate. Limit all sweets and desserts. Seasonings and condiments All seasonings and condiments. The items listed above may not be a complete list of foods and drinks you can eat. Contact a dietitian for more options. What foods should I avoid? Fruits Canned fruit in heavy syrup. Fruit in cream or butter sauce. Fried fruit. Limit coconut. Vegetables Vegetables cooked in cheese, cream, or butter sauce. Fried vegetables. Grains Breads that are made with saturated or trans fats, oils, or whole milk. Croissants. Sweet rolls. Donuts. High-fat crackers, such as cheese crackers. Meats and other proteins Fatty meats, such as hot dogs, ribs, sausage, bacon, rib-eye roast or steak. High-fat deli meats, such as salami and bologna. Caviar. Domestic duck and goose. Organ meats, such as liver. Dairy Cream, sour cream, cream cheese, and creamed cottage cheese. Whole-milk cheeses. Whole or 2% milk that is liquid, evaporated, or condensed. Whole buttermilk. Cream sauce or high-fat cheese sauce. Yogurt that is made from whole milk. Fats and oils Meat fat, or shortening. Cocoa butter, hydrogenated oils, palm oil, coconut oil, palm kernel oil. Solid fats and shortenings, including bacon fat, salt pork, lard, and butter. Nondairy cream substitutes. Salad dressings with cheese or sour cream. Beverages Regular sodas and juice drinks with added sugar. Sweets and desserts Frosting. Pudding. Cookies. Cakes. Pies. Milk chocolate or white chocolate. Buttered syrups. Full-fat ice cream or ice cream drinks. The items listed above may not be a complete list of foods and drinks to avoid. Contact a dietitian for more information. Summary Heart-healthy meal planning includes eating less unhealthy fats, eating more healthy fats, and making other changes in your diet. Eat a balanced diet. This includes fruits and vegetables, low-fat or nonfat dairy, lean protein, nuts and legumes, whole grains, and  heart-healthy oils and fats. This information is not intended to replace advice given to you by your health care provider. Make sure you discuss any questions you have with your health care provider. Document Revised: 12/20/2020 Document Reviewed: 12/20/2020 Elsevier Patient Education  2022 Elsevier Inc.  

## 2021-08-09 NOTE — Assessment & Plan Note (Signed)
Encourage regular stretching and core strengthening Encouraged weight loss as this can help reduce back pain Continue Meloxicam as needed

## 2021-08-09 NOTE — Assessment & Plan Note (Signed)
A1c and urine microalbumin today Encouraged her to consume a low-carb diet and exercise for weight loss Continue Metformin Consider changing Glipizide to XL as she only remembers to take this once daily Encourage routine eye exam Encourage routine foot exam Flu and Pneumovax UTD Encouraged her to get her COVID booster

## 2021-08-09 NOTE — Assessment & Plan Note (Signed)
Stable on her current dose of Bupropion Support offered

## 2021-08-09 NOTE — Assessment & Plan Note (Signed)
Encourage diet and exercise for weight loss 

## 2021-08-10 MED ORDER — GLIPIZIDE ER 10 MG PO TB24: 20.0000 mg | ORAL_TABLET | Freq: Every day | ORAL | 2 refills | Status: DC

## 2021-08-10 MED ORDER — GLIPIZIDE ER 2.5 MG PO TB24: 20.0000 mg | ORAL_TABLET | Freq: Every day | ORAL | Status: DC

## 2021-08-29 ENCOUNTER — Telehealth: Payer: Commercial Managed Care - PPO | Admitting: Physician Assistant

## 2021-08-29 DIAGNOSIS — B029 Zoster without complications: Secondary | ICD-10-CM

## 2021-08-29 MED ORDER — VALACYCLOVIR HCL 1 G PO TABS: 1000.0000 mg | ORAL_TABLET | Freq: Three times a day (TID) | ORAL | 0 refills | Status: AC

## 2021-08-29 NOTE — Patient Instructions (Signed)
Lanette Hampshire, thank you for joining [name], PA-C for today's virtual visit.  While this provider is not your primary care provider (PCP), if your PCP is located in our provider database this encounter information will be shared with them immediately following your visit.  Consent: (Patient) Hannah Foley provided verbal consent for this virtual visit at the beginning of the encounter.  Current Medications:  Current Outpatient Medications:    buPROPion (WELLBUTRIN SR) 150 MG 12 hr tablet, TAKE 1 TABLET(150 MG) BY MOUTH TWICE DAILY, Disp: 180 tablet, Rfl: 0   glipiZIDE (GLUCOTROL XL) 10 MG 24 hr tablet, Take 2 tablets (20 mg total) by mouth daily with breakfast., Disp: 60 tablet, Rfl: 2   meloxicam (MOBIC) 7.5 MG tablet, Take 1 tablet (7.5 mg total) by mouth daily. (Patient taking differently: Take 7.5 mg by mouth daily as needed.), Disp: 90 tablet, Rfl: 0   metFORMIN (GLUCOPHAGE) 850 MG tablet, TAKE 1 TABLET(850 MG) BY MOUTH TWICE DAILY WITH A MEAL, Disp: 180 tablet, Rfl: 0   norethindrone-ethinyl estradiol-iron (JUNEL FE 1.5/30) 1.5-30 MG-MCG tablet, Take 1 tablet by mouth daily. Pt takes continuously, Disp: 84 tablet, Rfl: 4   Medications ordered in this encounter:  No orders of the defined types were placed in this encounter.    *If you need refills on other medications prior to your next appointment, please contact your pharmacy*  Follow-Up: Call back or seek an in-person evaluation if the symptoms worsen or if the condition fails to improve as anticipated.  Other Instructions Please keep skin clean and dry. Take the Valtrex as directed. Keep the area covered and hands washed if you have to be around others.  Avoid contact with pregnant women and the very elderly as much as possible. Once the area is scabbed over, you are no longer contagious. I want you to have a follow-up with your primary care provider if anything is not improving after a few days or any new  symptoms. If you note rash moving towards the eye, any eye pain or pain with movement of the eye, vision changes, etc -- ER ASAP. Do not delay care.   Shingles Shingles is an infection. It gives you a painful skin rash and blisters that have fluid in them. Shingles is caused by the same germ (virus) that causes chickenpox. Shingles only happens in people who: Have had chickenpox. Have been given a shot (vaccine) to protect against chickenpox. Shingles is rare in this group. What are the causes? This condition is caused by varicella-zoster virus. This is the same germ that causes chickenpox. After a person is exposed to the germ, the germ stays in the body but is not active (dormant). Shingles develops if the germ becomes active again (is reactivated). This can happen many years after the first exposure to the germ. It is not known what causes this germ to become active again. What increases the risk? People who have had chickenpox or received the chickenpox shot are at risk for shingles. This infection is more common in people who: Are older than 43 years of age. Have a weakened disease-fighting system (immune system), such as people with: HIV (human immunodeficiency virus). AIDS (acquired immunodeficiency syndrome). Cancer. Are taking medicines that weaken the immune system, such as organ transplant medicines. Have a lot of stress. What are the signs or symptoms? The first symptoms of shingles may be itching, tingling, or pain in an area on your skin. A rash will show on your skin  a few days or weeks later. This is what usually happens: The rash is likely to be on one side of your body. The rash usually has a shape like a belt or a band. Over time, the rash turns into fluid-filled blisters. The blisters will break open and change into scabs. The scabs usually dry up in about 2-3 weeks. You may also have: A fever. Chills. A headache. A feeling like you may vomit (nausea). How is this  treated? The rash may last for several weeks. There is not a specific cure for this condition. Your doctor may prescribe medicines. Medicines may: Help with pain. Help you get better sooner. Help to prevent long-term problems. Help with itching (antihistamines). If the area involved is on your face, you may need to see a specialist. This may be an eye doctor or an ear, nose, and throat (ENT) doctor. Follow these instructions at home: Medicines Take over-the-counter and prescription medicines only as told by your doctor. Put on an anti-itch cream or numbing cream where you have a rash, blisters, or scabs. Do this as told by your doctor. Helping with itching and discomfort  Put cold, wet cloths (cold compresses) on the area of the rash or blisters as told by your doctor. Cool baths can help you feel better. Try adding baking soda or dry oatmeal to the water to lessen itching. Do not bathe in hot water. Use calamine lotion as told by your doctor. Blister and rash care Keep your rash covered with a loose bandage (dressing). Wear loose clothing that does not rub on your rash. Wash your hands with soap and water for at least 20 seconds before and after you change your bandage. If you cannot use soap and water, use hand sanitizer. Change your bandage as told by your doctor. Keep your rash and blisters clean. To do this, wash the area with mild soap and cool water as told by your doctor. Check your rash every day for signs of infection. Check for: More redness, swelling, or pain. Fluid or blood. Warmth. Pus or a bad smell. Do not scratch your rash. Do not pick at your blisters. To help you to not scratch: Keep your fingernails clean and cut short. Wear gloves or mittens when you sleep, if scratching is a problem. General instructions Rest as told by your doctor. Wash your hands often with soap and water for at least 20 seconds. If you cannot use soap and water, use hand sanitizer. Doing this  lowers your chance of getting a skin infection. Your infection can cause chickenpox in people who have never had chickenpox or never got a chickenpox vaccine shot. If you have blisters that did not change into scabs yet, try not to touch other people or be around other people, especially: Babies. Pregnant women. Children who have areas of red, itchy, or rough skin (eczema). Older people who have organ transplants. People who have a long-term (chronic) illness, like cancer or AIDS. Keep all follow-up visits. How is this prevented? A vaccine shot is the best way to prevent shingles and protect against shingles problems. If you have not had a vaccine shot, talk with your doctor about getting it. Where to find more information Centers for Disease Control and Prevention: [employer]com.brwww.cdc.gov Contact a doctor if: Your pain does not get better with medicine. Your pain does not get better after the rash heals. You have any of these signs of infection around the rash: More redness, swelling, or pain. Fluid or blood.  Warmth. Pus or a bad smell. You have a fever. Get help right away if: The rash is on your face or nose. You have pain in your face or pain by your eye. You lose feeling on one side of your face. You have trouble seeing. You have ear pain, or you have ringing in your ear. You have a loss of taste. Your condition gets worse. Summary Shingles gives you a painful skin rash and blisters that have fluid in them. Shingles is caused by the same germ (virus) that causes chickenpox. Keep your rash covered with a loose bandage. Wear loose clothing that does not rub on your rash. If you have blisters that did not change into scabs yet, try not to touch other people or be around people. This information is not intended to replace advice given to you by your health care provider. Make sure you discuss any questions you have with your health care provider. Document Revised: 08/06/2020 Document  Reviewed: 08/06/2020 Elsevier Patient Education  2022 [employer].    If you have been instructed to have an in-person evaluation today at a local Urgent Care facility, please use the link below. It will take you to a list of all of our available [facility], including address, phone number and hours of operation. Please do not delay care.  [facility]  If you or a family member do not have a primary care provider, use the link below to schedule a visit and establish care. When you choose a [facility] primary care physician or advanced practice provider, you gain a long-term partner in health. Find a Primary Care Provider  Learn more about [facility] in-office and virtual care options: [facility] - Get Care Now

## 2021-08-29 NOTE — Progress Notes (Signed)
Virtual Visit Consent   Hannah Foley, you are scheduled for a virtual visit with a [facility] provider today.     Just as with appointments in the office, your consent must be obtained to participate.  Your consent will be active for this visit and any virtual visit you may have with one of our providers in the next 365 days.     If you have a MyChart account, a copy of this consent can be sent to you electronically.  All virtual visits are billed to your insurance company just like a traditional visit in the office.    As this is a virtual visit, video technology does not allow for your provider to perform a traditional examination.  This may limit your provider's ability to fully assess your condition.  If your provider identifies any concerns that need to be evaluated in person or the need to arrange testing (such as labs, EKG, etc.), we will make arrangements to do so.     Although advances in technology are sophisticated, we cannot ensure that it will always work on either your end or our end.  If the connection with a video visit is poor, the visit may have to be switched to a telephone visit.  With either a video or telephone visit, we are not always able to ensure that we have a secure connection.     I need to obtain your verbal consent now.   Are you willing to proceed with your visit today?    [name] has provided verbal consent on 08/29/2021 for a virtual visit (video or telephone).   Hannah Foley, [place]   Date: 08/29/2021 7:59 AM   Virtual Visit via Video Note   I, Hannah Foley, connected with  Hannah Foley  ([id number], [birth year]) on 08/29/21 at  7:45 AM EST by a video-enabled telemedicine application and verified that I am speaking with the correct person using two identifiers.  Location: Patient: Virtual Visit Location Patient: Home Provider: Virtual Visit Location Provider: Home Office   I discussed the limitations of evaluation and management by  telemedicine and the availability of in person appointments. The patient expressed understanding and agreed to proceed.    History of Present Illness: Hannah Foley is a 41 y.o. who identifies as a female who was assigned female at birth, and is being seen today for worsening rash of face, under the L eye with some mild swelling. Initially noted some mild pain and redness in the area about 3-4 days ago. As such was evaluated via Telehealth through her work and was thought to have the possible start of a mild cellulitis. Was started on Bactroban ointment which she has applied as directed. Over the past 24 hours notes itching, stinging and now more of a blistering rash in the area,just above her L cheek. Denies any eye pain, decrease EOM, vision changes. Denies fever, chills.   HPI: HPI  Problems:  Patient Active Problem List   Diagnosis Date Noted   HLD (hyperlipidemia) 08/09/2021   Class 1 obesity due to excess calories with body mass index (BMI) of 31.0 to 31.9 in adult 08/09/2021   Chronic low back pain 01/29/2021   Anxiety 06/09/2018   DM (diabetes mellitus) (HCC) 10/25/2010    Allergies: No Known Allergies Medications:  Current Outpatient Medications:    valACYclovir (VALTREX) 1000 MG tablet, Take 1 tablet (1,000 mg total) by mouth 3 (three) times daily for 7 days., Disp: 21  tablet, Rfl: 0   buPROPion (WELLBUTRIN SR) 150 MG 12 hr tablet, TAKE 1 TABLET(150 MG) BY MOUTH TWICE DAILY, Disp: 180 tablet, Rfl: 0   glipiZIDE (GLUCOTROL XL) 10 MG 24 hr tablet, Take 2 tablets (20 mg total) by mouth daily with breakfast., Disp: 60 tablet, Rfl: 2   meloxicam (MOBIC) 7.5 MG tablet, Take 1 tablet (7.5 mg total) by mouth daily. (Patient taking differently: Take 7.5 mg by mouth daily as needed.), Disp: 90 tablet, Rfl: 0   metFORMIN (GLUCOPHAGE) 850 MG tablet, TAKE 1 TABLET(850 MG) BY MOUTH TWICE DAILY WITH A MEAL, Disp: 180 tablet, Rfl: 0   norethindrone-ethinyl estradiol-iron (JUNEL FE 1.5/30) 1.5-30  MG-MCG tablet, Take 1 tablet by mouth daily. Pt takes continuously, Disp: 84 tablet, Rfl: 4  Observations/Objective: Patient is well-developed, well-nourished in no acute distress.  Resting comfortably at home.  Head is normocephalic, atraumatic.  No labored breathing. Speech is clear and coherent with logical content.  Patient is alert and oriented at baseline.  Small area of erythema with noted vesicular lesions overlying the l cheek with some mild infraorbital "puffiness" noted.   Assessment and Plan: 1. Herpes zoster without complication - valACYclovir (VALTREX) 1000 MG tablet; Take 1 tablet (1,000 mg total) by mouth 3 (three) times daily for 7 days.  Dispense: 21 tablet; Refill: 0  Giving location, strict ER precautions reviewed with patient. Will start Rx Valtrex 1000 mg TID x 7 days. Supportive measures and OTC medications reviewed. Strict follow-up with PCP discussed.   Follow Up Instructions: I discussed the assessment and treatment plan with the patient. The patient was provided an opportunity to ask questions and all were answered. The patient agreed with the plan and demonstrated an understanding of the instructions.  A copy of instructions were sent to the patient via MyChart unless otherwise noted below.    The patient was advised to call back or seek an in-person evaluation if the symptoms worsen or if the condition fails to improve as anticipated.  Time:  I spent 12 minutes with the patient via telehealth technology discussing the above problems/concerns.    Hannah Climes, PA-C

## 2021-09-04 ENCOUNTER — Encounter: Payer: Self-pay | Admitting: Internal Medicine

## 2021-09-05 NOTE — Telephone Encounter (Signed)
This was discussed with pt yesterday while rounding at [facility]

## 2021-09-21 ENCOUNTER — Other Ambulatory Visit: Payer: Self-pay | Admitting: Internal Medicine

## 2021-09-21 DIAGNOSIS — E1169 Type 2 diabetes mellitus with other specified complication: Secondary | ICD-10-CM

## 2021-09-22 ENCOUNTER — Encounter: Payer: Self-pay | Admitting: Internal Medicine

## 2021-09-22 DIAGNOSIS — E1169 Type 2 diabetes mellitus with other specified complication: Secondary | ICD-10-CM

## 2021-09-22 NOTE — Telephone Encounter (Signed)
Pt is due to follow up in March and has not been scheduled. Sent pt MyChart reminder to schedule a appt in March.  Last RF 06/17/21 #180. Refilled [id number] advise. Last Hgb A1c out of normal range. Requested Prescriptions  Pending Prescriptions Disp Refills   metFORMIN (GLUCOPHAGE) 850 MG tablet [Pharmacy Med Name: METFORMIN 850MG TABLETS] 180 tablet 0    Sig: TAKE 1 TABLET(850 MG) BY MOUTH TWICE DAILY WITH A MEAL     Endocrinology:  Diabetes - Biguanides Failed - 09/21/2021  5:32 PM      Failed - HBA1C is between 0 and 7.9 and within 180 days    HbA1c POC (<> result, manual entry)  Date Value Ref Range Status  10/06/2018 8.1 4.0 - 5.6 % Final   Hgb A1c MFr Bld  Date Value Ref Range Status  08/08/2021 10.1 (H) <5.7 % of total Hgb Final    Comment:    For someone without known diabetes, a hemoglobin A1c value of 6.5% or greater indicates that they may have  diabetes and this should be confirmed with a follow-up  test. . For someone with known diabetes, a value <7% indicates  that their diabetes is well controlled and a value  greater than or equal to 7% indicates suboptimal  control. A1c targets should be individualized based on  duration of diabetes, age, comorbid conditions, and  other considerations. . Currently, no consensus exists regarding use of hemoglobin A1c for diagnosis of diabetes for children. .           Passed - Cr in normal range and within 360 days    Creat  Date Value Ref Range Status  08/08/2021 0.94 0.50 - 0.99 mg/dL Final   Creatinine, POC  Date Value Ref Range Status  03/09/2018 100 mg/dL Final   Creatinine,U  Date Value Ref Range Status  10/11/2019 237.7 mg/dL Final   Creatinine, Urine  Date Value Ref Range Status  08/08/2021 66 20 - 275 mg/dL Final          Passed - eGFR in normal range and within 360 days    GFR, Est African American  Date Value Ref Range Status  01/29/2021 115 > OR = 60 mL/min/1.48m Final   GFR, Est Non African  American  Date Value Ref Range Status  01/29/2021 99 > OR = 60 mL/min/1.750mFinal   GFR  Date Value Ref Range Status  10/11/2019 77.01 >60.00 mL/min Final   eGFR  Date Value Ref Range Status  08/08/2021 78 > OR = 60 mL/min/1.7317minal    Comment:    The eGFR is based on the CKD-EPI 2021 equation. To calculate  the new eGFR from a previous Creatinine or Cystatin C result, go to https://www.kidney.org/professionals/ kdoqi/gfr%5Fcalculator           Passed - Valid encounter within last 6 months    Recent Outpatient Visits           1 month ago Type 2 diabetes mellitus with hyperglycemia, without long-term current use of insulin [facility]   7 months ago Encounter for general adult medical examination with abnormal findings   [facility]

## 2021-09-23 MED ORDER — METFORMIN HCL 850 MG PO TABS: ORAL_TABLET | ORAL | 0 refills | Status: DC

## 2021-10-16 ENCOUNTER — Encounter: Payer: Self-pay | Admitting: Internal Medicine

## 2021-10-17 MED ORDER — GLIPIZIDE 10 MG PO TABS: 10.0000 mg | ORAL_TABLET | Freq: Two times a day (BID) | ORAL | 0 refills | Status: DC

## 2021-11-01 ENCOUNTER — Other Ambulatory Visit: Payer: Self-pay | Admitting: Internal Medicine

## 2021-11-01 NOTE — Telephone Encounter (Signed)
Refused Glipizide XL 10 mg because order was changed on 2/23/223 to 10 mg twice a day #180, 0 refills. ?

## 2021-11-09 ENCOUNTER — Other Ambulatory Visit: Payer: Self-pay | Admitting: Internal Medicine

## 2021-11-11 NOTE — Telephone Encounter (Signed)
Requested Prescriptions  ?Pending Prescriptions Disp Refills  ?? buPROPion (WELLBUTRIN SR) 150 MG 12 hr tablet [Pharmacy Med Name: BUPROPION SR 150MG  TABLETS (12 H)] 180 tablet 0  ?  Sig: TAKE 1 TABLET(150 MG) BY MOUTH TWICE DAILY  ?  ? Psychiatry: Antidepressants - bupropion Passed - 11/09/2021  3:13 PM  ?  ?  Passed - Cr in normal range and within 360 days  ?  Creat  ?Date Value Ref Range Status  ?08/08/2021 0.94 0.50 - 0.99 mg/dL Final  ? ?Creatinine, POC  ?Date Value Ref Range Status  ?03/09/2018 100 mg/dL Final  ? ?Creatinine,U  ?Date Value Ref Range Status  ?10/11/2019 237.7 mg/dL Final  ? ?Creatinine, Urine  ?Date Value Ref Range Status  ?08/08/2021 66 20 - 275 mg/dL Final  ?   ?  ?  Passed - AST in normal range and within 360 days  ?  AST  ?Date Value Ref Range Status  ?08/08/2021 14 10 - 30 U/L Final  ?  Comment:  ?  Verified by repeat analysis. ?. ?  ?   ?  ?  Passed - ALT in normal range and within 360 days  ?  ALT  ?Date Value Ref Range Status  ?08/08/2021 13 6 - 29 U/L Final  ?   ?  ?  Passed - Last BP in normal range  ?  BP Readings from Last 1 Encounters:  ?08/09/21 138/70  ?   ?  ?  Passed - Valid encounter within last 6 months  ?  Recent Outpatient Visits   ?      ? 3 months ago Type 2 diabetes mellitus with hyperglycemia, without long-term current use of insulin ([place])  ? [facility], Coralie Keens, NP  ? 9 months ago Encounter for general adult medical examination with abnormal findings  ? [facility] [place], Coralie Keens, NP  ?  ?  ? ?  ?  ?  ? ? ?

## 2022-01-02 ENCOUNTER — Other Ambulatory Visit: Payer: Self-pay | Admitting: Internal Medicine

## 2022-01-02 ENCOUNTER — Encounter: Payer: Self-pay | Admitting: Internal Medicine

## 2022-01-02 DIAGNOSIS — E1169 Type 2 diabetes mellitus with other specified complication: Secondary | ICD-10-CM

## 2022-01-02 MED ORDER — METFORMIN HCL 850 MG PO TABS: ORAL_TABLET | ORAL | 0 refills | Status: DC

## 2022-01-07 ENCOUNTER — Ambulatory Visit
Admission: RE | Admit: 2022-01-07 | Discharge: 2022-01-07 | Disposition: A | Discharge status: Home / Self Care | Payer: Commercial Managed Care - PPO | Source: Home / Self Care | Attending: Internal Medicine | Admitting: Internal Medicine

## 2022-01-07 ENCOUNTER — Encounter: Payer: Self-pay | Admitting: Internal Medicine

## 2022-01-07 ENCOUNTER — Ambulatory Visit (INDEPENDENT_AMBULATORY_CARE_PROVIDER_SITE_OTHER): Payer: Commercial Managed Care - PPO | Admitting: Internal Medicine

## 2022-01-07 ENCOUNTER — Ambulatory Visit
Admission: RE | Admit: 2022-01-07 | Discharge: 2022-01-07 | Disposition: A | Discharge status: Home / Self Care | Payer: Commercial Managed Care - PPO | Source: Ambulatory Visit | Attending: Internal Medicine | Admitting: Internal Medicine

## 2022-01-07 VITALS — BP 138/92 | HR 93 | Temp 97.5°F | Wt 225.0 lb

## 2022-01-07 DIAGNOSIS — M25562 Pain in left knee: Secondary | ICD-10-CM | POA: Insufficient documentation

## 2022-01-07 DIAGNOSIS — Z6832 Body mass index (BMI) 32.0-32.9, adult: Secondary | ICD-10-CM

## 2022-01-07 DIAGNOSIS — F419 Anxiety disorder, unspecified: Secondary | ICD-10-CM

## 2022-01-07 DIAGNOSIS — G8929 Other chronic pain: Secondary | ICD-10-CM

## 2022-01-07 DIAGNOSIS — E66811 Obesity, class 1: Secondary | ICD-10-CM

## 2022-01-07 DIAGNOSIS — M545 Low back pain, unspecified: Secondary | ICD-10-CM | POA: Diagnosis not present

## 2022-01-07 DIAGNOSIS — I10 Essential (primary) hypertension: Secondary | ICD-10-CM

## 2022-01-07 DIAGNOSIS — E1169 Type 2 diabetes mellitus with other specified complication: Secondary | ICD-10-CM

## 2022-01-07 DIAGNOSIS — E6609 Other obesity due to excess calories: Secondary | ICD-10-CM

## 2022-01-07 DIAGNOSIS — D751 Secondary polycythemia: Secondary | ICD-10-CM

## 2022-01-07 DIAGNOSIS — E782 Mixed hyperlipidemia: Secondary | ICD-10-CM

## 2022-01-07 LAB — POCT GLYCOSYLATED HEMOGLOBIN (HGB A1C): Hemoglobin A1C: 9.8 % — AB (ref 4.0–5.6)

## 2022-01-07 MED ORDER — LOSARTAN POTASSIUM 25 MG PO TABS
25.0000 mg | ORAL_TABLET | Freq: Every day | ORAL | 0 refills | Status: DC
Start: 2022-01-07 — End: 2022-04-15

## 2022-01-07 MED ORDER — OZEMPIC (0.25 OR 0.5 MG/DOSE) 2 MG/1.5ML ~~LOC~~ SOPN: 0.2500 mg | PEN_INJECTOR | SUBCUTANEOUS | 3 refills | Status: DC

## 2022-01-07 NOTE — Assessment & Plan Note (Signed)
Uncontrolled off meds ?Reinforced DASH diet and exercise weight loss ?Rx for Losartan 25 mg daily ?

## 2022-01-07 NOTE — Patient Instructions (Signed)

## 2022-01-07 NOTE — Assessment & Plan Note (Signed)
Stable on Bupropion ?Support offered ?

## 2022-01-07 NOTE — Assessment & Plan Note (Signed)
Encourage diet and exercise for weight loss °We will trial Ozempic °

## 2022-01-07 NOTE — Assessment & Plan Note (Signed)
POCT A1c 9.8% ?Urine microalbumin has been checked in last year ?Continue Metformin and Glipizide ?We will add Ozempic, sample provided today ?Encourage low-carb diet and exercise for weight loss ?Advised her to schedule an appointment for her eye exam ?Encourage routine foot exam ?Encouraged her to get a flu shot in the fall ?Pneumovax UTD ?Encouraged her to get a COVID booster ?

## 2022-01-07 NOTE — Assessment & Plan Note (Signed)
Noncompliant with cholesterol-lowering medication, discussed risk of heart attack stroke and death ?Encouraged her to consume a low fat diet ?C-Met and lipid profile today ?

## 2022-01-07 NOTE — Progress Notes (Signed)
? ?Subjective:  ? ? Patient ID: Hannah Foley, female    DOB: 1981-01-29, 41 y.o.   MRN: [id number] ? ?HPI ? ?Pt presents to the clinic today for follow up of chronic conditions.  ? ?Anxiety: Chronic, managed on Bupropion. She is not currently seeing a therapist. She denies depression, SI/HI. ? ?DM 2: Her last A1C was 10.1%, 07/2021. She is taking Metformin and Glipizide as prescribed. She does not check her sugars. Her last eye exam was > 1 year ago. She checks her feet routinely. Flu 05/2021. Pneumovax 05/2018. Covid Moderna x 2. ? ?Chronic Low Back Pain: Managed with Meloxicam as needed. She does not follow with orthopedics. ? ?HLD: Her last LDL was 125, triglycerides 218, 07/2021. She is not taking Atorvastatin as prescribed. She tries to consume a low fat diet. ?  ?Polycythemia: Her last H/H was 16.3/48.9, 01/2021. She does not smoke. She follows with hematology. ? ?HTN: Her BP today is 148/92.  She is not taking any antihypertensive medications at this time. ? ?She also reports left knee pain.  This started 3 days ago after hitting her knee while trying to get into the bathtub.  She describes the pain as sharp and stabbing.  The pain is worse with standing.  She has not tried anything OTC for this. ? ? ?Review of Systems ? ?   ?Past Medical History:  ?Diagnosis Date  ? Diabetes mellitus   ? type 2  ? Low back pain 03/12/2011  ? 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 ? ?Current Outpatient Medications  ?Medication Sig Dispense Refill  ? buPROPion (WELLBUTRIN SR) 150 MG 12 hr tablet TAKE 1 TABLET(150 MG) BY MOUTH TWICE DAILY 180 tablet 0  ? glipiZIDE (GLUCOTROL) 10 MG tablet Take 1 tablet (10 mg total) by mouth 2 (two) times daily before a meal. 180 tablet 0  ? meloxicam (MOBIC) 7.5 MG tablet Take 1 tablet (7.5 mg total) by mouth daily. (Patient taking differently: Take 7.5 mg by mouth daily as needed.) 90 tablet 0  ? metFORMIN (GLUCOPHAGE) 850 MG tablet TAKE 1 TABLET(850 MG) BY MOUTH TWICE DAILY WITH A  MEAL 180 tablet 0  ? norethindrone-ethinyl estradiol-iron (JUNEL FE 1.5/30) 1.5-30 MG-MCG tablet Take 1 tablet by mouth daily. Pt takes continuously 84 tablet 4  ? ?No current facility-administered medications for this visit.  ? ? ?No Known Allergies ? ?Family History  ?Problem Relation Age of Onset  ? Cancer Paternal Aunt 66  ?     breast  ? Cancer Other   ?     breast  ? Cancer Mother   ? Breast cancer Mother 32  ? ? ?Social History  ? ?Socioeconomic History  ? Marital status: Single  ?  Spouse name: Not on file  ? Number of children: 0  ? Years of education: Not on file  ? Highest education level: Not on file  ?Occupational History  ? 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  Employer: [employer]  ?Tobacco Use  ? Smoking status: Every Day  ?  Packs/day: 1.00  ?  Types: Cigarettes  ? Smokeless tobacco: Never  ?Vaping Use  ? Vaping Use: Never used  ?Substance and Sexual Activity  ? Alcohol use: Yes  ?  Alcohol/week: 0.0 standard drinks  ?  Comment: occasional  ? Drug use: No  ? Sexual activity: Not Currently  ?  Birth control/protection: Pill  ?Other Topics Concern  ? Not on file  ?Social  History Narrative  ? Not on file  ? ?Social Determinants of Health  ? ?Financial Resource Strain: Not on file  ?Food Insecurity: Not on file  ?Transportation Needs: Not on file  ?Physical Activity: Not on file  ?Stress: Not on file  ?Social Connections: Not on file  ?Intimate Partner Violence: Not on file  ? ? ? ?Constitutional: Denies fever, malaise, fatigue, headache or abrupt weight changes.  ?HEENT: Denies eye pain, eye redness, ear pain, ringing in the ears, wax buildup, runny nose, nasal congestion, bloody nose, or sore throat. ?Respiratory: Denies difficulty breathing, shortness of breath, cough or sputum production.   ?Cardiovascular: Denies chest pain, chest tightness, palpitations or swelling in the hands or feet.  ?Gastrointestinal: Denies abdominal pain, bloating, constipation, diarrhea or blood in the  stool.  ?GU: Denies urgency, frequency, pain with urination, burning sensation, blood in urine, odor or discharge. ?Musculoskeletal: Pt reports chronic low back pain, left knee pain. Denies decrease in range of motion, difficulty with gait, muscle pain or joint swelling.  ?Skin: Denies redness, rashes, lesions or ulcercations.  ?Neurological: Denies dizziness, difficulty with memory, difficulty with speech or problems with balance and coordination.  ?Psych: Denies anxiety, depression, SI/HI. ? ?No other specific complaints in a complete review of systems (except as listed in HPI above). ? ?Objective:  ? Physical Exam ? ?BP (!) 138/92 (BP Location: Right Arm, Patient Position: Sitting, Cuff Size: Large)   Pulse 93   Temp (!) 97.5 ?F (36.4 ?C) (Temporal)   Wt 225 lb (102.1 kg)   SpO2 99%   BMI 32.28 kg/m?  ? ?Wt Readings from Last 3 Encounters:  ?08/08/21 218 lb 3.2 oz (99 kg)  ?05/28/21 213 lb 12.8 oz (97 kg)  ?01/29/21 212 lb (96.2 kg)  ? ? ?General: Appears her stated age, obese, in NAD. ?Skin: Warm, dry and intact.  Abrasion noted over left kneecap.  No rashes, lesions or ulcerations noted. ?HEENT: Head: normal shape and size; Eyes: sclera white, no icterus, conjunctiva pink, PERRLA and EOMs intact;Neck:   ?Cardiovascular: Normal rate and rhythm. S1,S2 noted.  No murmur, rubs or gallops noted. No JVD or BLE edema.  ?Pulmonary/Chest: Normal effort and positive vesicular breath sounds. No respiratory distress. No wheezes, rales or ronchi noted.  ?Musculoskeletal:  No difficulty with gait.  ?Neurological: Alert and oriented. ? ?BMET ?   ?Component Value Date/Time  ? NA 135 08/08/2021 1530  ? K 4.8 08/08/2021 1530  ? CL 98 08/08/2021 1530  ? CO2 26 08/08/2021 1530  ? GLUCOSE 249 (H) 08/08/2021 1530  ? BUN 16 08/08/2021 1530  ? CREATININE 0.94 08/08/2021 1530  ? CALCIUM 10.0 08/08/2021 1530  ? GFRNONAA 99 01/29/2021 1017  ? GFRAA 115 01/29/2021 1017  ? ? ?Lipid Panel  ?   ?Component Value Date/Time  ? CHOL 201 (H)  08/08/2021 1530  ? TRIG 218 (H) 08/08/2021 1530  ? HDL 40 (L) 08/08/2021 1530  ? CHOLHDL 5.0 (H) 08/08/2021 1530  ? VLDL 20.2 01/09/2020 0910  ? LDLCALC 125 (H) 08/08/2021 1530  ? ? ?CBC ?   ?Component Value Date/Time  ? WBC 11.7 (H) 01/29/2021 1017  ? RBC 5.80 (H) 01/29/2021 1017  ? HGB 16.3 (H) 01/29/2021 1017  ? HCT 48.9 (H) 01/29/2021 1017  ? PLT 331 01/29/2021 1017  ? MCV 84.3 01/29/2021 1017  ? MCH 28.1 01/29/2021 1017  ? MCHC 33.3 01/29/2021 1017  ? RDW 12.9 01/29/2021 1017  ? LYMPHSABS 2.2 01/09/2020 0910  ? MONOABS 0.3  01/09/2020 0910  ? EOSABS 0.0 01/09/2020 0910  ? BASOSABS 0.0 01/09/2020 0910  ? ? ?Hgb A1C ?Lab Results  ?Component Value Date  ? HGBA1C 10.1 (H) 08/08/2021  ? ? ? ? ? ? ?   ?Assessment & Plan:  ? ?Left Knee Pain: ? ?X-ray left knee today ?Encouraged rest, ice and elevation ?Okay to take Ibuprofen 600 mg OTC every 8 hours as needed ? ?Nicki Reaper, NP ? ?

## 2022-01-07 NOTE — Assessment & Plan Note (Signed)
Encourage regular stretching and core strengthening as this can help reduce back pain ?Continue Meloxicam as needed ?

## 2022-01-07 NOTE — Assessment & Plan Note (Signed)
Discussed the need for smoking cessation versus monthly blood donation ?CBC with differential today ? ?

## 2022-01-08 ENCOUNTER — Encounter: Payer: Self-pay | Admitting: Internal Medicine

## 2022-01-08 ENCOUNTER — Other Ambulatory Visit: Payer: Self-pay | Admitting: Internal Medicine

## 2022-01-08 LAB — COMPLETE METABOLIC PANEL WITH GFR
AG Ratio: 1.6 (calc) (ref 1.0–2.5)
ALT: 10 U/L (ref 6–29)
AST: 11 U/L (ref 10–30)
Albumin: 4.3 g/dL (ref 3.6–5.1)
Alkaline phosphatase (APISO): 49 U/L (ref 31–125)
BUN: 14 mg/dL (ref 7–25)
CO2: 24 mmol/L (ref 20–32)
Calcium: 9.7 mg/dL (ref 8.6–10.2)
Chloride: 103 mmol/L (ref 98–110)
Creat: 0.79 mg/dL (ref 0.50–0.99)
Globulin: 2.7 g/dL (calc) (ref 1.9–3.7)
Glucose, Bld: 259 mg/dL — ABNORMAL HIGH (ref 65–139)
Potassium: 4.6 mmol/L (ref 3.5–5.3)
Sodium: 135 mmol/L (ref 135–146)
Total Bilirubin: 0.3 mg/dL (ref 0.2–1.2)
Total Protein: 7 g/dL (ref 6.1–8.1)
eGFR: 96 mL/min/{1.73_m2} (ref >=60)

## 2022-01-08 LAB — LIPID PANEL
Cholesterol: 215 mg/dL — ABNORMAL HIGH (ref <200)
HDL: 41 mg/dL — ABNORMAL LOW (ref >=50)
LDL Cholesterol (Calc): 142 mg/dL (calc) — ABNORMAL HIGH
Non-HDL Cholesterol (Calc): 174 mg/dL (calc) — ABNORMAL HIGH (ref <130)
Total CHOL/HDL Ratio: 5.2 (calc) — ABNORMAL HIGH (ref <5.0)
Triglycerides: 187 mg/dL — ABNORMAL HIGH (ref <150)

## 2022-01-08 LAB — CBC WITH DIFFERENTIAL/PLATELET
Absolute Monocytes: 713 cells/uL (ref 200–950)
Basophils Absolute: 92 cells/uL (ref 0–200)
Basophils Relative: 0.8 %
Eosinophils Absolute: 150 cells/uL (ref 15–500)
Eosinophils Relative: 1.3 %
HCT: 45.9 % — ABNORMAL HIGH (ref 35.0–45.0)
Hemoglobin: 15.3 g/dL (ref 11.7–15.5)
Lymphs Abs: 3692 cells/uL (ref 850–3900)
MCH: 28.5 pg (ref 27.0–33.0)
MCHC: 33.3 g/dL (ref 32.0–36.0)
MCV: 85.6 fL (ref 80.0–100.0)
MPV: 11.2 fL (ref 7.5–12.5)
Monocytes Relative: 6.2 %
Neutro Abs: 6854 cells/uL (ref 1500–7800)
Neutrophils Relative %: 59.6 %
Platelets: 344 10*3/uL (ref 140–400)
RBC: 5.36 10*6/uL — ABNORMAL HIGH (ref 3.80–5.10)
RDW: 12.7 % (ref 11.0–15.0)
Total Lymphocyte: 32.1 %
WBC: 11.5 10*3/uL — ABNORMAL HIGH (ref 3.8–10.8)

## 2022-01-08 MED ORDER — GLIPIZIDE 10 MG PO TABS: 10.0000 mg | ORAL_TABLET | Freq: Two times a day (BID) | ORAL | 0 refills | Status: DC

## 2022-01-09 ENCOUNTER — Other Ambulatory Visit: Payer: Self-pay | Admitting: Internal Medicine

## 2022-01-09 DIAGNOSIS — E782 Mixed hyperlipidemia: Secondary | ICD-10-CM

## 2022-01-10 NOTE — Telephone Encounter (Signed)
Requested medications are due for refill today.  Unsure   Requested medications are on the active medications list.  no  Last refill. 01/31/2021 #90 0 refills  Future visit scheduled.   no  Notes to clinic.  Medication was discontinued 10/10/2020.    Requested Prescriptions  Pending Prescriptions Disp Refills   atorvastatin (LIPITOR) 20 MG tablet [Pharmacy Med Name: ATORVASTATIN 20MG  TABLETS] 90 tablet 0    Sig: TAKE 1 TABLET(20 MG) BY MOUTH DAILY AT 6 PM     Cardiovascular:  Antilipid - Statins Failed - 01/09/2022  4:40 PM      Failed - Lipid Panel in normal range within the last 12 months    Cholesterol  Date Value Ref Range Status  01/07/2022 215 (H) <200 mg/dL Final   LDL Cholesterol (Calc)  Date Value Ref Range Status  01/07/2022 142 (H) mg/dL (calc) Final    Comment:    Reference range: <100 . Desirable range <100 mg/dL for primary prevention;   <70 mg/dL for patients with CHD or diabetic patients  with > or = 2 CHD risk factors. 01/09/2022 LDL-C is now calculated using the Martin-Hopkins  calculation, which is a validated novel method providing  better accuracy than the Friedewald equation in the  estimation of LDL-C.  Marland Kitchen et al. Horald Pollen. Lenox Ahr): 2061-2068  (http://education.[facility].com/faq/FAQ164)    HDL  Date Value Ref Range Status  01/07/2022 41 (L) > OR = 50 mg/dL Final   Triglycerides  Date Value Ref Range Status  01/07/2022 187 (H) <150 mg/dL Final         Passed - Patient is not pregnant      Passed - Valid encounter within last 12 months    Recent Outpatient Visits           3 days ago Type 2 diabetes mellitus with other specified complication, without long-term current use of insulin [facility])   [facility] [place], Mullins W, NP   5 months ago Type 2 diabetes mellitus with hyperglycemia, without long-term current use of insulin [facility])   [facility]   11 months ago Encounter for general  adult medical examination with abnormal findings   [facility] [place], Mullins, NP

## 2022-01-13 ENCOUNTER — Encounter (INDEPENDENT_AMBULATORY_CARE_PROVIDER_SITE_OTHER): Payer: Commercial Managed Care - PPO | Admitting: Internal Medicine

## 2022-01-13 DIAGNOSIS — M25562 Pain in left knee: Secondary | ICD-10-CM

## 2022-01-14 NOTE — Telephone Encounter (Signed)

## 2022-01-14 NOTE — Telephone Encounter (Signed)
Please see the MyChart message reply(ies) for my assessment and plan.  ?  ?This patient gave consent for this Medical Advice Message and is aware that it may result in a bill to their insurance company, as well as the possibility of receiving a bill for a co-payment or deductible. They are an established patient, but are not seeking medical advice exclusively about a problem treated during an in person or video visit in the last seven days. I did not recommend an in person or video visit within seven days of my reply.  ?  ?I spent a total of 3 minutes cumulative time within 7 days through MyChart messaging. ? ?Khaliya Golinski, NP  ? ?

## 2022-01-14 NOTE — Addendum Note (Signed)
Addended by: Lorre Munroe on: 01/14/2022 10:42 AM   Modules accepted: Orders

## 2022-01-29 ENCOUNTER — Encounter: Payer: Self-pay | Admitting: Internal Medicine

## 2022-01-29 DIAGNOSIS — G8929 Other chronic pain: Secondary | ICD-10-CM

## 2022-02-11 ENCOUNTER — Ambulatory Visit
Admission: RE | Admit: 2022-02-11 | Discharge: 2022-02-11 | Disposition: A | Discharge status: Home / Self Care | Payer: Commercial Managed Care - PPO | Source: Ambulatory Visit | Attending: Internal Medicine | Admitting: Internal Medicine

## 2022-02-11 DIAGNOSIS — G8929 Other chronic pain: Secondary | ICD-10-CM | POA: Diagnosis present

## 2022-02-11 DIAGNOSIS — M25562 Pain in left knee: Secondary | ICD-10-CM | POA: Insufficient documentation

## 2022-02-12 ENCOUNTER — Encounter: Payer: Self-pay | Admitting: Internal Medicine

## 2022-03-03 ENCOUNTER — Encounter: Payer: Self-pay | Admitting: Internal Medicine

## 2022-03-03 NOTE — Telephone Encounter (Signed)
Ok to give her a sample

## 2022-03-31 ENCOUNTER — Other Ambulatory Visit: Payer: Self-pay | Admitting: Internal Medicine

## 2022-03-31 DIAGNOSIS — E1169 Type 2 diabetes mellitus with other specified complication: Secondary | ICD-10-CM

## 2022-04-01 NOTE — Telephone Encounter (Signed)
Requested Prescriptions  Pending Prescriptions Disp Refills  . metFORMIN (GLUCOPHAGE) 850 MG tablet [Pharmacy Med Name: METFORMIN 850MG TABLETS] 180 tablet 0    Sig: TAKE 1 TABLET(850 MG) BY MOUTH TWICE DAILY WITH A MEAL     Endocrinology:  Diabetes - Biguanides Failed - 03/31/2022 10:57 AM      Failed - HBA1C is between 0 and 7.9 and within 180 days    Hemoglobin A1C  Date Value Ref Range Status  01/07/2022 9.8 (A) 4.0 - 5.6 % Final   HbA1c POC (<> result, manual entry)  Date Value Ref Range Status  10/06/2018 8.1 4.0 - 5.6 % Final   Hgb A1c MFr Bld  Date Value Ref Range Status  08/08/2021 10.1 (H) <5.7 % of total Hgb Final    Comment:    For someone without known diabetes, a hemoglobin A1c value of 6.5% or greater indicates that they may have  diabetes and this should be confirmed with a follow-up  test. . For someone with known diabetes, a value <7% indicates  that their diabetes is well controlled and a value  greater than or equal to 7% indicates suboptimal  control. A1c targets should be individualized based on  duration of diabetes, age, comorbid conditions, and  other considerations. . Currently, no consensus exists regarding use of hemoglobin A1c for diagnosis of diabetes for children. .          Failed - B12 Level in normal range and within 720 days    No results found for: "VITAMINB12"       Passed - Cr in normal range and within 360 days    Creat  Date Value Ref Range Status  01/07/2022 0.79 0.50 - 0.99 mg/dL Final   Creatinine, POC  Date Value Ref Range Status  03/09/2018 100 mg/dL Final   Creatinine,U  Date Value Ref Range Status  10/11/2019 237.7 mg/dL Final   Creatinine, Urine  Date Value Ref Range Status  08/08/2021 66 20 - 275 mg/dL Final         Passed - eGFR in normal range and within 360 days    GFR, Est African American  Date Value Ref Range Status  01/29/2021 115 > OR = 60 mL/min/1.16m Final   GFR, Est Non African American  Date  Value Ref Range Status  01/29/2021 99 > OR = 60 mL/min/1.752mFinal   GFR  Date Value Ref Range Status  10/11/2019 77.01 >60.00 mL/min Final   eGFR  Date Value Ref Range Status  01/07/2022 96 > OR = 60 mL/min/1.7311minal    Comment:    The eGFR is based on the CKD-EPI 2021 equation. To calculate  the new eGFR from a previous Creatinine or Cystatin C result, go to https://www.kidney.org/professionals/ kdoqi/gfr%5Fcalculator          Passed - Valid encounter within last 6 months    Recent Outpatient Visits          2 months ago Type 2 diabetes mellitus with other specified complication, without long-term current use of insulin [facility]   7 months ago Type 2 diabetes mellitus with hyperglycemia, without long-term current use of insulin [facility]   1 year ago Encounter for general adult medical examination with abnormal findings   [facility]      Future Appointments  In 2 weeks Baity, Coralie Keens, NP [facility], [place] within normal limits and completed in the last 12 months    WBC  Date Value Ref Range Status  01/07/2022 11.5 (H) 3.8 - 10.8 Thousand/uL Final   RBC  Date Value Ref Range Status  01/07/2022 5.36 (H) 3.80 - 5.10 Million/uL Final   Hemoglobin  Date Value Ref Range Status  01/07/2022 15.3 11.7 - 15.5 g/dL Final   HCT  Date Value Ref Range Status  01/07/2022 45.9 (H) 35.0 - 45.0 % Final   MCHC  Date Value Ref Range Status  01/07/2022 33.3 32.0 - 36.0 g/dL Final   [facility]  Date Value Ref Range Status  01/07/2022 28.5 27.0 - 33.0 pg Final   MCV  Date Value Ref Range Status  01/07/2022 85.6 80.0 - 100.0 fL Final   No results found for: "PLTCOUNTKUC", "LABPLAT", "POCPLA" RDW  Date Value Ref Range Status  01/07/2022 12.7 11.0 - 15.0 % Final

## 2022-04-08 ENCOUNTER — Ambulatory Visit
Admission: EM | Admit: 2022-04-08 | Discharge: 2022-04-08 | Disposition: A | Discharge status: Home / Self Care | Payer: Commercial Managed Care - PPO

## 2022-04-08 ENCOUNTER — Ambulatory Visit (INDEPENDENT_AMBULATORY_CARE_PROVIDER_SITE_OTHER): Payer: Commercial Managed Care - PPO

## 2022-04-08 ENCOUNTER — Ambulatory Visit: Payer: Self-pay | Admitting: *Deleted

## 2022-04-08 ENCOUNTER — Other Ambulatory Visit: Payer: Self-pay

## 2022-04-08 ENCOUNTER — Encounter: Payer: Self-pay | Admitting: Emergency Medicine

## 2022-04-08 DIAGNOSIS — J019 Acute sinusitis, unspecified: Secondary | ICD-10-CM

## 2022-04-08 DIAGNOSIS — J208 Acute bronchitis due to other specified organisms: Secondary | ICD-10-CM

## 2022-04-08 MED ORDER — DOXYCYCLINE HYCLATE 100 MG PO CAPS: 100.0000 mg | ORAL_CAPSULE | Freq: Two times a day (BID) | ORAL | 0 refills | Status: DC

## 2022-04-08 MED ORDER — FLUCONAZOLE 150 MG PO TABS: 150.0000 mg | ORAL_TABLET | ORAL | 0 refills | Status: DC | PRN

## 2022-04-08 MED ORDER — PREDNISONE 20 MG PO TABS: 40.0000 mg | ORAL_TABLET | Freq: Every day | ORAL | 0 refills | Status: DC

## 2022-04-08 MED ORDER — AMOXICILLIN-POT CLAVULANATE 875-125 MG PO TABS: 1.0000 | ORAL_TABLET | Freq: Two times a day (BID) | ORAL | 0 refills | Status: DC

## 2022-04-08 MED ORDER — ALBUTEROL SULFATE (2.5 MG/3ML) 0.083% IN NEBU
2.5000 mg | INHALATION_SOLUTION | RESPIRATORY_TRACT | Status: AC
  Administered 2022-04-08: 2.5 mg via RESPIRATORY_TRACT

## 2022-04-08 MED ORDER — PROMETHAZINE-DM 6.25-15 MG/5ML PO SYRP: 5.0000 mL | ORAL_SOLUTION | Freq: Four times a day (QID) | ORAL | 0 refills | Status: DC | PRN

## 2022-04-08 MED ORDER — PROMETHAZINE-DM 6.25-15 MG/5ML PO SYRP: 5.0000 mL | ORAL_SOLUTION | Freq: Three times a day (TID) | ORAL | 0 refills | Status: DC | PRN

## 2022-04-08 NOTE — ED Triage Notes (Signed)
Made 2 calls with telehealth at work.  Was prescribed albuterol

## 2022-04-08 NOTE — Discharge Instructions (Addendum)
Complete all medication as prescribed.  If any of your symptoms worsen or do not improve with prescribed treatment return for evaluation or follow-up with primary care provider.   Continue albuterol inhaler if you develop any severe wheezing or chest tightness however limit use to 2 puffs every 6 hours as needed given palpitations that the medication caused.

## 2022-04-08 NOTE — ED Triage Notes (Signed)
Concerned for pneumonia.  works at [employer]

## 2022-04-08 NOTE — ED Provider Notes (Signed)
Hannah Foley    CSN: [id number] Arrival date & time: 04/08/22  [id number]      History   Chief Complaint Chief Complaint  Patient presents with   Cough    Cough and shortness of breath, wheezing since Monday 8/6 - Entered by patient    HPI Hannah Foley is a 41 y.o. female.   HPI Patient with history of diabetes, recurrent bronchitis, current smoker, presents today for evaluation of one week history of worsening cough, wheezing, and shortness of breath, and nasal congestion.  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 Denies any known sick exposures.  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    Patient Active Problem List   Diagnosis Date Noted   Polycythemia 01/07/2022   HTN (hypertension) 01/07/2022   HLD (hyperlipidemia) 08/09/2021   Class 1 obesity due to excess calories with body mass index (BMI) of 32.0 to 32.9 in adult 08/09/2021   Chronic low back pain 01/29/2021   Anxiety 06/09/2018   DM (diabetes mellitus) ([place]) 10/25/2010    Past Surgical History:  Procedure Laterality Date   CHOLECYSTECTOMY     DIAGNOSTIC LAPAROSCOPY     Endometriosis   FOOT SURGERY     Cyst removed    OB History     Gravida  0   Para  0   Term  0   Preterm  0   AB  0   Living  0      SAB  0   IAB  0   Ectopic  0   Multiple  0   Live Births  0            Home Medications    Prior to Admission medications   Medication Sig Start Date End Date Taking? Authorizing Provider  doxycycline (VIBRAMYCIN) 100 MG capsule Take 1 capsule (100 mg total) by mouth 2 (two) times daily. 04/08/22  Yes Scot Jun, FNP  fluconazole (DIFLUCAN) 150 MG tablet Take 1 tablet (150 mg total) by mouth every three (3) days as needed. 04/08/22  Yes Scot Jun, FNP  predniSONE (DELTASONE) 20  MG tablet Take 2 tablets (40 mg total) by mouth daily with breakfast. 04/08/22  Yes Scot Jun, FNP  albuterol (VENTOLIN HFA) 108 (90 Base) MCG/ACT inhaler Inhale into the lungs. 04/07/22   [provider]  atorvastatin (LIPITOR) 20 MG tablet TAKE 1 TABLET(20 MG) BY MOUTH DAILY AT 6 PM 01/10/22   Jearld Fenton, NP  buPROPion [facility] SR) 150 MG 12 hr tablet TAKE 1 TABLET(150 MG) BY MOUTH TWICE DAILY 11/11/21   Jearld Fenton, NP  glipiZIDE (GLUCOTROL) 10 MG tablet Take 1 tablet (10 mg total) by mouth 2 (two) times daily before a meal. 01/08/22   Baity, Coralie Keens, NP  losartan (COZAAR) 25 MG tablet Take 1 tablet (25 mg total) by mouth daily. 01/07/22   Jearld Fenton, NP  meloxicam (MOBIC) 7.5 MG tablet Take 1 tablet (7.5 mg total) by mouth daily. Patient taking differently: Take 7.5 mg by mouth daily as needed. 01/29/21   Jearld Fenton, NP  metFORMIN (GLUCOPHAGE) 850 MG tablet TAKE 1 TABLET(850 MG) BY MOUTH TWICE DAILY WITH A MEAL 04/01/22   Jearld Fenton, NP  norethindrone-ethinyl estradiol-iron (  JUNEL FE 1.5/30) 1.5-30 MG-MCG tablet Take 1 tablet by mouth daily. Pt takes continuously Patient not taking: Reported on 04/08/2022 05/10/21   Anyanwu, Jethro Bastos, MD  OZEMPIC, 0.25 OR 0.5 MG/DOSE, 2 MG/1.5ML SOPN Inject 0.25 mg into the skin once a week. For first 4 weeks. Then increase dose to 0.5mg  weekly 01/07/22   Lorre Munroe, NP  promethazine-dextromethorphan (PROMETHAZINE-DM) 6.25-15 MG/5ML syrup Take 5 mLs by mouth 3 (three) times daily as needed for cough. 04/08/22   Bing Neighbors, FNP    Family History Family History  Problem Relation Age of Onset   Cancer Mother    Breast cancer Mother 3   Cancer Paternal Aunt 83       breast   Cancer Other        breast    Social History Social History   Tobacco Use   Smoking status: Every Day    Packs/day: 1.00    Types: Cigarettes   Smokeless tobacco: Never  Vaping Use   Vaping Use: Never used  Substance Use Topics    Alcohol use: Yes    Alcohol/week: 0.0 standard drinks of alcohol    Comment: occasional   Drug use: No     Allergies   Patient has no known allergies.   Review of Systems Review of Systems Pertinent negatives listed in HPI   Physical Exam Triage Vital Signs ED Triage Vitals  Enc Vitals Group     BP 04/08/22 0845 (!) 146/96     Pulse Rate 04/08/22 0845 (!) 110     Resp 04/08/22 0845 18     Temp 04/08/22 0845 98.1 F (36.7 C)     Temp Source 04/08/22 0845 Oral     SpO2 04/08/22 0845 97 %     Weight --      Height --      Head Circumference --      Peak Flow --      Pain Score 04/08/22 0850 4     Pain Loc --      Pain Edu? --      Excl. in GC? --    No data found.  Updated Vital Signs BP (!) 146/96 (BP Location: Left Arm)   Pulse (!) 110   Temp 98.1 F (36.7 C) (Oral)   Resp 18   LMP 03/16/2022   SpO2 97%   Visual Acuity Right Eye Distance:   Left Eye Distance:   Bilateral Distance:    Right Eye Near:   Left Eye Near:    Bilateral Near:     Physical Exam Constitutional:      Appearance: She is well-developed. She is ill-appearing.  HENT:     Head: Normocephalic and atraumatic.     Right Ear: Tympanic membrane and ear canal normal.     Left Ear: Tympanic membrane and ear canal normal.     Nose: Mucosal edema, congestion and rhinorrhea present.     Mouth/Throat:     Mouth: No oral lesions.     Pharynx: Uvula midline. Pharyngeal swelling and uvula swelling present. No oropharyngeal exudate or posterior oropharyngeal erythema.     Tonsils: No tonsillar exudate or tonsillar abscesses. 0 on the right. 0 on the left.  Eyes:     Conjunctiva/sclera: Conjunctivae normal.     Pupils: Pupils are equal, round, and reactive to light.  Neck:     Thyroid: No thyromegaly.     Trachea: No tracheal deviation.  Cardiovascular:  Rate and Rhythm: Regular rhythm. Tachycardia present.     Heart sounds: Normal heart sounds.  Pulmonary:     Effort: Pulmonary effort  is normal.     Breath sounds: Decreased air movement present. Rhonchi present. No wheezing.  Musculoskeletal:     Cervical back: Normal range of motion and neck supple.  Lymphadenopathy:     Cervical: No cervical adenopathy.  Skin:    General: Skin is warm and dry.     Capillary Refill: Capillary refill takes less than 2 seconds.  Neurological:     General: No focal deficit present.     Mental Status: She is alert and oriented to person, place, and time.  Psychiatric:        Mood and Affect: Mood normal.        Behavior: Behavior normal.        Thought Content: Thought content normal.        Judgment: Judgment normal.      UC Treatments / Results  Labs (all labs ordered are listed, but only abnormal results are displayed) Labs Reviewed - No data to display  EKG   Radiology DG Chest 2 View  Result Date: 04/08/2022 CLINICAL DATA:  41 year old female with cough, wheezing, shortness of breath. EXAM: CHEST - 2 VIEW COMPARISON:  Chest radiograph 03/20/2010. FINDINGS: Lung volumes and mediastinal contours remain normal. Visualized tracheal air column is within normal limits. Both lungs appear clear. No pneumothorax or pleural effusion. Stable cholecystectomy clips. Negative visible bowel gas. No acute osseous abnormality identified. IMPRESSION: Negative.  No cardiopulmonary abnormality. Electronically Signed   By: Odessa Fleming M.D.   On: 04/08/2022 09:27    Procedures Procedures (including critical care time)  Medications Ordered in UC Medications  albuterol (PROVENTIL) (2.5 MG/3ML) 0.083% nebulizer solution 2.5 mg (2.5 mg Nebulization Given 04/08/22 0930)    Initial Impression / Assessment and Plan / UC Course  I have reviewed the triage vital signs and the nursing notes.  Pertinent labs & imaging results that were available during my care of the patient were reviewed by me and considered in my medical decision making (see chart for details).    CXR unremarkable. Treating for  sinusitis and acute bronchitis. Nebulizer treatment given here in clinic, WOB and air movement improved. Treatment per discharge medication orders and instructions. Strict return precautions given. Patient verbalized understanding and agreement with plan. Final Clinical Impressions(s) / UC Diagnoses   Final diagnoses:  Acute bronchitis due to other specified organisms  Acute non-recurrent sinusitis, unspecified location     Discharge Instructions      Complete all medication as prescribed.  If any of your symptoms worsen or do not improve with prescribed treatment return for evaluation or follow-up with primary care provider.   Continue albuterol inhaler if you develop any severe wheezing or chest tightness however limit use to 2 puffs every 6 hours as needed given palpitations that the medication caused.      ED Prescriptions     Medication Sig Dispense Auth. Provider   predniSONE (DELTASONE) 20 MG tablet Take 2 tablets (40 mg total) by mouth daily with breakfast. 10 tablet Bing Neighbors, FNP   amoxicillin-clavulanate (AUGMENTIN) 875-125 MG tablet  (Status: Discontinued) Take 1 tablet by mouth 2 (two) times daily. 20 tablet Bing Neighbors, FNP   fluconazole (DIFLUCAN) 150 MG tablet Take 1 tablet (150 mg total) by mouth every three (3) days as needed. 2 tablet Bing Neighbors, FNP   promethazine-dextromethorphan (PROMETHAZINE-DM)  6.25-15 MG/5ML syrup  (Status: Discontinued) Take 5 mLs by mouth 4 (four) times daily as needed for cough. 180 mL Scot Jun, FNP   promethazine-dextromethorphan (PROMETHAZINE-DM) 6.25-15 MG/5ML syrup Take 5 mLs by mouth 3 (three) times daily as needed for cough. 180 mL Scot Jun, FNP   amoxicillin-clavulanate (AUGMENTIN) 875-125 MG tablet  (Status: Discontinued) Take 1 tablet by mouth 2 (two) times daily. 20 tablet Scot Jun, FNP   doxycycline (VIBRAMYCIN) 100 MG capsule Take 1 capsule (100 mg total) by mouth 2 (two) times  daily. 20 capsule Scot Jun, FNP      PDMP not reviewed this encounter.   Scot Jun, FNP 04/08/22 1319

## 2022-04-08 NOTE — ED Triage Notes (Signed)
Cough, sob, wheezing started last Monday.  History of bronchitis every year per patient.  States "fever 99 yesterday".  Reports congestion moving around with coughing, chest soreness with coughing

## 2022-04-08 NOTE — Telephone Encounter (Signed)
  Chief Complaint: Cough SOB Symptoms: Non productive cough, "But wet" Congestion, SOB at rest, when not coughing,LGT yesterday 99.0 wheezing. States she gets bronchitis every year.  Frequency: 8 days ago Pertinent Negatives: Patient denies sore throat, body aches,states covid neg, "Test every day in health care."  Disposition: [] ED /[x] Urgent Care (no appt availability in office) / [] Appointment(In office/virtual)/ []  [facility]/ [] Home Care/ [] Refused Recommended Disposition /[] [facility]/ []  Follow-up with PCP Additional Notes: Called practice, Rachell for consult, no availability, advised UC. States will follow disposition. Reason for Disposition  [1] MILD difficulty breathing (e.g., minimal/no SOB at rest, SOB with walking, pulse <100) AND [2] still present when not coughing  Answer Assessment - Initial Assessment Questions 1. ONSET: "When did the cough begin?"      8 days ago 2. SEVERITY: "How bad is the cough today?"      At times awake at night vomited once 3. SPUTUM: "Describe the color of your sputum" (none, dry cough; clear, white, yellow, green)     "Wet but not coming up" 4. HEMOPTYSIS: "Are you coughing up any blood?" If so ask: "How much?" (flecks, streaks, tablespoons, etc.)     no 5. DIFFICULTY BREATHING: "Are you having difficulty breathing?" If Yes, ask: "How bad is it?" (e.g., mild, moderate, severe)    - MILD: No SOB at rest, mild SOB with walking, speaks normally in sentences, can lie down, no retractions, pulse < 100.    - MODERATE: SOB at rest, SOB with minimal exertion and prefers to sit, cannot lie down flat, speaks in phrases, mild retractions, audible wheezing, pulse 100-120.    - SEVERE: Very SOB at rest, speaks in single words, struggling to breathe, sitting hunched forward, retractions, pulse > 120      SOB at rest, shallow 6. FEVER: "Do you have a fever?" If Yes, ask: "What is your temperature, how was it measured, and when did it  start?"     Yesterday, 99.0 7. CARDIAC HISTORY: "Do you have any history of heart disease?" (e.g., heart attack, congestive heart failure)      *No Answer* 8. LUNG HISTORY: "Do you have any history of lung disease?"  (e.g., pulmonary embolus, asthma, emphysema)     *No Answer* 9. PE RISK FACTORS: "Do you have a history of blood clots?" (or: recent major surgery, recent prolonged travel, bedridden)     *No Answer* 10. OTHER SYMPTOMS: "Do you have any other symptoms?" (e.g., runny nose, wheezing, chest pain)       Wheezing.  Covid neg 1  Protocols used: Cough - Acute Non-Productive-A-AH

## 2022-04-14 ENCOUNTER — Other Ambulatory Visit: Payer: Self-pay | Admitting: *Deleted

## 2022-04-14 NOTE — Telephone Encounter (Signed)
Faxed back refill request for OCP's, refilled plus with one additional refill

## 2022-04-15 ENCOUNTER — Encounter: Payer: Self-pay | Admitting: Internal Medicine

## 2022-04-15 ENCOUNTER — Other Ambulatory Visit: Payer: Self-pay | Admitting: Internal Medicine

## 2022-04-15 ENCOUNTER — Ambulatory Visit: Payer: Commercial Managed Care - PPO | Admitting: Internal Medicine

## 2022-04-15 VITALS — BP 136/82 | HR 109 | Temp 97.3°F | Ht 70.0 in | Wt 218.0 lb

## 2022-04-15 DIAGNOSIS — E782 Mixed hyperlipidemia: Secondary | ICD-10-CM

## 2022-04-15 DIAGNOSIS — E1169 Type 2 diabetes mellitus with other specified complication: Secondary | ICD-10-CM

## 2022-04-15 DIAGNOSIS — E6609 Other obesity due to excess calories: Secondary | ICD-10-CM | POA: Diagnosis not present

## 2022-04-15 DIAGNOSIS — Z6831 Body mass index (BMI) 31.0-31.9, adult: Secondary | ICD-10-CM | POA: Diagnosis not present

## 2022-04-15 DIAGNOSIS — Z1231 Encounter for screening mammogram for malignant neoplasm of breast: Secondary | ICD-10-CM

## 2022-04-15 LAB — POCT GLYCOSYLATED HEMOGLOBIN (HGB A1C): Hemoglobin A1C: 7.3 % — AB (ref 4.0–5.6)

## 2022-04-15 MED ORDER — OZEMPIC (1 MG/DOSE) 4 MG/3ML ~~LOC~~ SOPN: 1.0000 mg | PEN_INJECTOR | SUBCUTANEOUS | 0 refills | Status: DC

## 2022-04-15 MED ORDER — FLUCONAZOLE 150 MG PO TABS: 150.0000 mg | ORAL_TABLET | ORAL | 0 refills | Status: DC | PRN

## 2022-04-15 MED ORDER — LOSARTAN POTASSIUM 25 MG PO TABS: 25.0000 mg | ORAL_TABLET | Freq: Every day | ORAL | 1 refills | Status: DC

## 2022-04-15 NOTE — Progress Notes (Signed)
Subjective:    Patient ID: Hannah Foley, female    DOB: Jun 05, 1981, 41 y.o.   MRN: [id number]  HPI  Patient presents to clinic today for her annual exam.  Flu: 06/2021 Tetanus: 08/2013 COVID: Moderna x2 Pneumovax: 05/2018 Pap smear: 05/2021 Mammogram: 04/2021 Vision screening: as needed Dentist: annually  Diet: She does eat meat. She consumes fruits and veggies. She does eat some fried foods. She drinks mostly Coke Zero. 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     atorvastatin (LIPITOR) 20 MG tablet TAKE 1 TABLET(20 MG) BY MOUTH DAILY AT 6 PM 90 tablet 0   buPROPion (WELLBUTRIN SR) 150 MG 12 hr tablet TAKE 1 TABLET(150 MG) BY MOUTH TWICE DAILY 180 tablet 0   doxycycline (VIBRAMYCIN) 100 MG capsule Take 1 capsule (100 mg total) by mouth 2 (two) times daily. 20 capsule 0   fluconazole (DIFLUCAN) 150 MG tablet Take 1 tablet (150 mg total) by mouth every three (3) days as needed. 2 tablet 0   glipiZIDE (GLUCOTROL) 10 MG tablet Take 1 tablet (10 mg total) by mouth 2 (two) times daily before a meal. 180 tablet 0   losartan (COZAAR) 25 MG tablet Take 1 tablet (25 mg total) by mouth daily. 90 tablet 0   meloxicam (MOBIC) 7.5 MG tablet Take 1 tablet (7.5 mg total) by mouth daily. (Patient taking differently: Take 7.5 mg by mouth daily as needed.) 90 tablet 0   metFORMIN (GLUCOPHAGE) 850 MG tablet TAKE 1 TABLET(850 MG) BY MOUTH TWICE DAILY WITH A MEAL 180 tablet 0   norethindrone-ethinyl estradiol-iron (JUNEL FE 1.5/30) 1.5-30 MG-MCG tablet Take 1 tablet by mouth daily. Pt takes continuously (Patient not taking: Reported on 04/08/2022) 84 tablet 4   OZEMPIC, 0.25 OR 0.5 MG/DOSE, 2 MG/1.5ML SOPN  Inject 0.25 mg into the skin once a week. For first 4 weeks. Then increase dose to 0.76m weekly 1.5 mL 3   predniSONE (DELTASONE) 20 MG tablet Take 2 tablets (40 mg total) by mouth daily with breakfast. 10 tablet 0   promethazine-dextromethorphan (PROMETHAZINE-DM) 6.25-15 MG/5ML syrup Take 5 mLs by mouth 3 (three) times daily as needed for cough. 180 mL 0   No current facility-administered medications for this visit.    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, theatre/television/filmmemory care    Employer: [employer]  Tobacco Use   Smoking status: Every Day    Packs/day: 1.00    Types: Cigarettes   Smokeless tobacco: Never  Vaping Use   Vaping Use: Never used  Substance and Sexual Activity   Alcohol use: Yes    Alcohol/week:  0.0 standard drinks of alcohol    Comment: occasional   Drug use: No   Sexual activity: Not Currently  Other Topics Concern   Not on file  Social History Narrative   Not on file   Social Determinants of Health   Financial Resource Strain: Not on file  Food Insecurity: Not on file  Transportation Needs: Not on file  Physical Activity: Not on file  Stress: Not on file  Social Connections: Not on file  Intimate Partner Violence: Not on file     Constitutional: Denies fever, malaise, fatigue, headache or abrupt weight changes.  HEENT: Denies eye pain, eye redness, ear pain, ringing in the ears, wax buildup, runny nose, nasal congestion, bloody nose, or sore throat. Respiratory: Patient reports cough.  Denies difficulty breathing, shortness of breath, or sputum production.    Cardiovascular: Denies chest pain, chest tightness, palpitations or swelling in the hands or feet.  Gastrointestinal: Pt reports intermittent reflux. Denies abdominal pain, bloating, constipation, diarrhea or blood in the stool.  GU: Denies urgency, frequency, pain with urination, burning sensation, blood in urine, odor or discharge. Musculoskeletal: Patient reports low back pain.  Denies decrease in range of motion, difficulty with gait, or joint swelling.  Skin: Denies redness, rashes, lesions or ulcercations.  Neurological: Denies dizziness, difficulty with memory, difficulty with speech or problems with balance and coordination.  Psych: Patient has a history of anxiety.  Denies depression, SI/HI.  No other specific complaints in a complete review of systems (except as listed in HPI above).  Objective:   Physical Exam BP 136/82 (BP Location: Left Arm, Patient Position: Sitting, Cuff Size: Normal)   Pulse (!) 109   Temp (!) 97.3 F (36.3 C) (Temporal)   Ht _0  (1.778 m)   Wt 218 lb (98.9 kg)   LMP 03/16/2022   SpO2 99%   BMI 31.28 kg/m   Wt Readings from Last 3 Encounters:  01/07/22 225 lb (102.1 kg)  08/08/21 218 lb 3.2 oz (99 kg)  05/28/21 213 lb 12.8 oz (97 kg)    General: Appears her stated age, obese in NAD. Skin: Warm, dry and intact. No ulcerations noted. HEENT: Head: normal shape and size; Eyes: sclera white, no icterus, conjunctiva pink, PERRLA and EOMs intact;  Neck:  Neck supple, trachea midline. No masses, lumps or thyromegaly present.  Cardiovascular: Tachycardic with rhythm. S1,S2 noted.  No murmur, rubs or gallops noted. No JVD or BLE edema.  Pulmonary/Chest: Normal effort and positive vesicular breath sounds. No respiratory distress. No wheezes, rales or ronchi noted.  Abdomen:  Normal bowel sounds.  Musculoskeletal: Strength 5/5 BUE/BLE.  No difficulty with gait.  Neurological: Alert and oriented. Cranial nerves II-XII grossly intact. Coordination normal.   Psychiatric: Mood and affect normal. Behavior is normal. Judgment and thought content normal.     BMET    Component Value Date/Time   NA 135 01/07/2022 1533   K 4.6 01/07/2022 1533   CL 103 01/07/2022 1533   CO2 24 01/07/2022 1533   GLUCOSE 259 (H) 01/07/2022 1533   BUN 14 01/07/2022 1533   CREATININE 0.79 01/07/2022 1533   CALCIUM 9.7 01/07/2022 1533   GFRNONAA 99 01/29/2021 1017   GFRAA 115 01/29/2021 1017    Lipid Panel     Component Value Date/Time   CHOL 215 (H) 01/07/2022 1533   TRIG 187 (H) 01/07/2022 1533   HDL 41 (L) 01/07/2022 1533   CHOLHDL 5.2 (H) 01/07/2022 1533   VLDL 20.2 01/09/2020 0910  LDLCALC 142 (H) 01/07/2022 1533    CBC    Component Value Date/Time   WBC 11.5 (H) 01/07/2022 1533   RBC 5.36 (H) 01/07/2022 1533   HGB 15.3 01/07/2022 1533   HCT 45.9 (H) 01/07/2022 1533   PLT 344 01/07/2022 1533   MCV 85.6 01/07/2022 1533   MCH 28.5 01/07/2022 1533   MCHC 33.3 01/07/2022 1533   RDW 12.7 01/07/2022 1533   LYMPHSABS 3,692 01/07/2022 1533   MONOABS 0.3 01/09/2020 0910   EOSABS 150 01/07/2022 1533   BASOSABS 92 01/07/2022 1533    Hgb A1C Lab Results  Component Value Date   HGBA1C 9.8 (A) 01/07/2022            Assessment & Plan:   Preventative Health Maintenance:  She will get a flu shot at work Tetanus UTD Encouraged her to get her COVID booster Pneumovax UTD Pap smear UTD Mammogram ordered-she will call to schedule Encouraged her to consume a balanced diet and exercise regimen Advised her see an eye doctor and dentist annually We will check CBC, c-Met, lipid and A1c today  RTC in 3 months, follow-up chronic conditions Webb Silversmith, NP

## 2022-04-15 NOTE — Patient Instructions (Signed)

## 2022-04-15 NOTE — Assessment & Plan Note (Signed)
Encourage diet and exercise for weight loss 

## 2022-04-16 LAB — CBC
HCT: 43.9 % (ref 35.0–45.0)
Hemoglobin: 15.2 g/dL (ref 11.7–15.5)
MCH: 29.1 pg (ref 27.0–33.0)
MCHC: 34.6 g/dL (ref 32.0–36.0)
MCV: 84.1 fL (ref 80.0–100.0)
MPV: 10.9 fL (ref 7.5–12.5)
Platelets: 346 10*3/uL (ref 140–400)
RBC: 5.22 10*6/uL — ABNORMAL HIGH (ref 3.80–5.10)
RDW: 12.8 % (ref 11.0–15.0)
WBC: 15.3 10*3/uL — ABNORMAL HIGH (ref 3.8–10.8)

## 2022-04-16 LAB — LIPID PANEL
Cholesterol: 143 mg/dL (ref <200)
HDL: 37 mg/dL — ABNORMAL LOW (ref >=50)
LDL Cholesterol (Calc): 83 mg/dL (calc)
Non-HDL Cholesterol (Calc): 106 mg/dL (calc) (ref <130)
Total CHOL/HDL Ratio: 3.9 (calc) (ref <5.0)
Triglycerides: 132 mg/dL (ref <150)

## 2022-04-16 LAB — COMPLETE METABOLIC PANEL WITH GFR
AG Ratio: 1.8 (calc) (ref 1.0–2.5)
ALT: 29 U/L (ref 6–29)
AST: 20 U/L (ref 10–30)
Albumin: 4.1 g/dL (ref 3.6–5.1)
Alkaline phosphatase (APISO): 70 U/L (ref 31–125)
BUN: 10 mg/dL (ref 7–25)
CO2: 24 mmol/L (ref 20–32)
Calcium: 9.6 mg/dL (ref 8.6–10.2)
Chloride: 102 mmol/L (ref 98–110)
Creat: 0.91 mg/dL (ref 0.50–0.99)
Globulin: 2.3 g/dL (calc) (ref 1.9–3.7)
Glucose, Bld: 157 mg/dL — ABNORMAL HIGH (ref 65–99)
Potassium: 4.3 mmol/L (ref 3.5–5.3)
Sodium: 138 mmol/L (ref 135–146)
Total Bilirubin: 0.4 mg/dL (ref 0.2–1.2)
Total Protein: 6.4 g/dL (ref 6.1–8.1)
eGFR: 80 mL/min/{1.73_m2} (ref >=60)

## 2022-04-16 NOTE — Telephone Encounter (Signed)
Requested Prescriptions  Pending Prescriptions Disp Refills  . atorvastatin (LIPITOR) 20 MG tablet [Pharmacy Med Name: ATORVASTATIN 20MG  TABLETS] 90 tablet 2    Sig: TAKE 1 TABLET(20 MG) BY MOUTH DAILY AT 6 PM     Cardiovascular:  Antilipid - Statins Failed - 04/15/2022  4:09 PM      Failed - Lipid Panel in normal range within the last 12 months    Cholesterol  Date Value Ref Range Status  04/15/2022 143 <200 mg/dL Final   LDL Cholesterol (Calc)  Date Value Ref Range Status  04/15/2022 83 mg/dL (calc) Final    Comment:    Reference range: <100 . Desirable range <100 mg/dL for primary prevention;   <70 mg/dL for patients with CHD or diabetic patients  with > or = 2 CHD risk factors. 04/17/2022 LDL-C is now calculated using the Martin-Hopkins  calculation, which is a validated novel method providing  better accuracy than the Friedewald equation in the  estimation of LDL-C.  Marland Kitchen et al. Horald Pollen. Lenox Ahr): 2061-2068  (http://education.[facility].com/faq/FAQ164)    HDL  Date Value Ref Range Status  04/15/2022 37 (L) > OR = 50 mg/dL Final   Triglycerides  Date Value Ref Range Status  04/15/2022 132 <150 mg/dL Final         Passed - Patient is not pregnant      Passed - Valid encounter within last 12 months    Recent Outpatient Visits          Yesterday Type 2 diabetes mellitus with other specified complication, without long-term current use of insulin [facility])   [facility] [place], Mullins, NP   3 months ago Type 2 diabetes mellitus with other specified complication, without long-term current use of insulin [facility])   [facility] [place], Mullins, NP   8 months ago Type 2 diabetes mellitus with hyperglycemia, without long-term current use of insulin [facility])   [facility] [place], Mullins, NP   1 year ago Encounter for general adult medical examination with abnormal findings   [facility] [place], Mullins, NP

## 2022-04-21 ENCOUNTER — Other Ambulatory Visit: Payer: Self-pay | Admitting: Internal Medicine

## 2022-04-22 NOTE — Telephone Encounter (Signed)
Requested Prescriptions  Pending Prescriptions Disp Refills  . glipiZIDE (GLUCOTROL) 10 MG tablet [Pharmacy Med Name: GLIPIZIDE 10MG  TABLETS] 180 tablet 0    Sig: TAKE 1 TABLET(10 MG) BY MOUTH TWICE DAILY BEFORE A MEAL     Endocrinology:  Diabetes - Sulfonylureas Passed - 04/21/2022 11:45 AM      Passed - HBA1C is between 0 and 7.9 and within 180 days    Hemoglobin A1C  Date Value Ref Range Status  04/15/2022 7.3 (A) 4.0 - 5.6 % Final   HbA1c POC (<> result, manual entry)  Date Value Ref Range Status  10/06/2018 8.1 4.0 - 5.6 % Final   Hgb A1c MFr Bld  Date Value Ref Range Status  08/08/2021 10.1 (H) <5.7 % of total Hgb Final    Comment:    For someone without known diabetes, a hemoglobin A1c value of 6.5% or greater indicates that they may have  diabetes and this should be confirmed with a follow-up  test. . For someone with known diabetes, a value <7% indicates  that their diabetes is well controlled and a value  greater than or equal to 7% indicates suboptimal  control. A1c targets should be individualized based on  duration of diabetes, age, comorbid conditions, and  other considerations. . Currently, no consensus exists regarding use of hemoglobin A1c for diagnosis of diabetes for children. .          Passed - Cr in normal range and within 360 days    Creat  Date Value Ref Range Status  04/15/2022 0.91 0.50 - 0.99 mg/dL Final   Creatinine, POC  Date Value Ref Range Status  03/09/2018 100 mg/dL Final   Creatinine,U  Date Value Ref Range Status  10/11/2019 237.7 mg/dL Final   Creatinine, Urine  Date Value Ref Range Status  08/08/2021 66 20 - 275 mg/dL Final         Passed - Valid encounter within last 6 months    Recent Outpatient Visits          1 week ago Type 2 diabetes mellitus with other specified complication, without long-term current use of insulin (HCC)   [facility] [place], Mullins, NP   3 months ago Type 2 diabetes mellitus  with other specified complication, without long-term current use of insulin [facility])   [facility] [place], Mullins W, NP   8 months ago Type 2 diabetes mellitus with hyperglycemia, without long-term current use of insulin [facility])   [facility] [place], Mullins, NP   1 year ago Encounter for general adult medical examination with abnormal findings   [facility] [place], Mullins, NP

## 2022-05-07 ENCOUNTER — Other Ambulatory Visit: Payer: Self-pay | Admitting: Internal Medicine

## 2022-05-08 NOTE — Telephone Encounter (Signed)
Requested Prescriptions  Pending Prescriptions Disp Refills  . buPROPion (WELLBUTRIN SR) 150 MG 12 hr tablet [Pharmacy Med Name: BUPROPION SR 150MG  TABLETS (12 H)] 180 tablet 0    Sig: TAKE 1 TABLET(150 MG) BY MOUTH TWICE DAILY     Psychiatry: Antidepressants - bupropion Passed - 05/07/2022  5:33 PM      Passed - Cr in normal range and within 360 days    Creat  Date Value Ref Range Status  04/15/2022 0.91 0.50 - 0.99 mg/dL Final   Creatinine, POC  Date Value Ref Range Status  03/09/2018 100 mg/dL Final   Creatinine,U  Date Value Ref Range Status  10/11/2019 237.7 mg/dL Final   Creatinine, Urine  Date Value Ref Range Status  08/08/2021 66 20 - 275 mg/dL Final         Passed - AST in normal range and within 360 days    AST  Date Value Ref Range Status  04/15/2022 20 10 - 30 U/L Final         Passed - ALT in normal range and within 360 days    ALT  Date Value Ref Range Status  04/15/2022 29 6 - 29 U/L Final         Passed - Last BP in normal range    BP Readings from Last 1 Encounters:  04/15/22 136/82         Passed - Valid encounter within last 6 months    Recent Outpatient Visits          3 weeks ago Type 2 diabetes mellitus with other specified complication, without long-term current use of insulin (HCC)   [facility] [place], Mullins, NP   4 months ago Type 2 diabetes mellitus with other specified complication, without long-term current use of insulin [facility])   [facility] [place], Mullins, NP   9 months ago Type 2 diabetes mellitus with hyperglycemia, without long-term current use of insulin [facility])   [facility] [place], Mullins, NP   1 year ago Encounter for general adult medical examination with abnormal findings   [facility] [place], Mullins, NP

## 2022-07-07 LAB — HM DIABETES EYE EXAM

## 2022-08-05 ENCOUNTER — Other Ambulatory Visit: Payer: Self-pay | Admitting: Internal Medicine

## 2022-08-05 MED ORDER — OZEMPIC (1 MG/DOSE) 4 MG/3ML ~~LOC~~ SOPN: 1.0000 mg | PEN_INJECTOR | SUBCUTANEOUS | 0 refills | Status: DC

## 2022-08-05 MED ORDER — BUPROPION HCL ER (SR) 150 MG PO TB12: 150.0000 mg | ORAL_TABLET | Freq: Two times a day (BID) | ORAL | 0 refills | Status: DC

## 2022-08-08 ENCOUNTER — Ambulatory Visit (INDEPENDENT_AMBULATORY_CARE_PROVIDER_SITE_OTHER): Payer: Commercial Managed Care - PPO | Admitting: Internal Medicine

## 2022-08-08 ENCOUNTER — Encounter: Payer: Self-pay | Admitting: Internal Medicine

## 2022-08-08 VITALS — BP 134/86 | HR 98 | Temp 96.6°F | Ht 70.0 in | Wt 198.0 lb

## 2022-08-08 DIAGNOSIS — E663 Overweight: Secondary | ICD-10-CM

## 2022-08-08 DIAGNOSIS — Z0001 Encounter for general adult medical examination with abnormal findings: Secondary | ICD-10-CM

## 2022-08-08 DIAGNOSIS — Z1231 Encounter for screening mammogram for malignant neoplasm of breast: Secondary | ICD-10-CM | POA: Diagnosis not present

## 2022-08-08 DIAGNOSIS — E1165 Type 2 diabetes mellitus with hyperglycemia: Secondary | ICD-10-CM

## 2022-08-08 DIAGNOSIS — Z6828 Body mass index (BMI) 28.0-28.9, adult: Secondary | ICD-10-CM

## 2022-08-08 MED ORDER — TIRZEPATIDE 5 MG/0.5ML ~~LOC~~ SOAJ: 5.0000 mg | SUBCUTANEOUS | 1 refills | Status: DC

## 2022-08-08 NOTE — Progress Notes (Signed)
Subjective:    Patient ID: Hannah Foley, female    DOB: 27-Oct-1980, 41 y.o.   MRN: [id number]  HPI  Patient presents to clinic today for her annual exam.  Flu: 06/2022 Tetanus: 08/2013 COVID: X 3 Pneumovax: 05/2018 Pap smear: 05/2021 Mammogram: 04/2021 Vision screening: annually Dentist: biannually  Diet: She does eat meat. She consumes fruits and veggies. She does eat some fried foods. She drinks mostly coke Zero Exercise: Walking  Review of Systems     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    Current Outpatient Medications  Medication Sig Dispense Refill   albuterol (VENTOLIN HFA) 108 (90 Base) MCG/ACT inhaler Inhale into the lungs.     atorvastatin (LIPITOR) 20 MG tablet TAKE 1 TABLET(20 MG) BY MOUTH DAILY AT 6 PM 90 tablet 2   buPROPion (WELLBUTRIN SR) 150 MG 12 hr tablet Take 1 tablet (150 mg total) by mouth 2 (two) times daily. 180 tablet 0   doxycycline (VIBRAMYCIN) 100 MG capsule Take 1 capsule (100 mg total) by mouth 2 (two) times daily. 20 capsule 0   fluconazole (DIFLUCAN) 150 MG tablet Take 1 tablet (150 mg total) by mouth every three (3) days as needed. 2 tablet 0   glipiZIDE (GLUCOTROL) 10 MG tablet TAKE 1 TABLET(10 MG) BY MOUTH TWICE DAILY BEFORE A MEAL 180 tablet 0   losartan (COZAAR) 25 MG tablet Take 1 tablet (25 mg total) by mouth daily. 90 tablet 1   meloxicam (MOBIC) 7.5 MG tablet Take 1 tablet (7.5 mg total) by mouth daily. (Patient taking differently: Take 7.5 mg by mouth daily as needed.) 90 tablet 0   metFORMIN (GLUCOPHAGE) 850 MG tablet TAKE 1 TABLET(850 MG) BY MOUTH TWICE DAILY WITH A MEAL 180 tablet 0   OZEMPIC, 1 MG/DOSE, 4 MG/3ML SOPN Inject 1 mg into the skin once a week. 9 mL 0   promethazine-dextromethorphan (PROMETHAZINE-DM) 6.25-15 MG/5ML syrup Take 5 mLs by mouth 3 (three) times daily as needed for cough. 180 mL 0   No current  facility-administered medications for this visit.    No Known Allergies  Family History  Problem Relation Age of Onset   Cancer Mother    Breast cancer Mother 50   Cancer Paternal Aunt 73       breast   Cancer Other        breast    Social History   Socioeconomic History   Marital status: Single    Spouse name: Not on file   Number of children: 0   Years of education: Not on file   Highest education level: Not on file  Occupational History   Occupation: Education officer, museum, Production designer, theatre/television/film memory care    Employer: [employer]  Tobacco Use   Smoking status: Every Day    Packs/day: 1.00    Types: Cigarettes   Smokeless tobacco: Never  Vaping Use   Vaping Use: Never used  Substance and Sexual Activity   Alcohol use: Yes    Alcohol/week: 0.0 standard drinks of alcohol    Comment: occasional   Drug use: No   Sexual activity: Not Currently  Other Topics Concern   Not on file  Social History Narrative   Not on file   Social Determinants of Health   Financial Resource Strain: Not on file  Food Insecurity: Not on file  Transportation Needs:  Not on file  Physical Activity: Not on file  Stress: Not on file  Social Connections: Not on file  Intimate Partner Violence: Not on file     Constitutional: Denies fever, malaise, fatigue, headache or abrupt weight changes.  HEENT: Denies eye pain, eye redness, ear pain, ringing in the ears, wax buildup, runny nose, nasal congestion, bloody nose, or sore throat. Respiratory: Denies difficulty breathing, shortness of breath, cough or sputum production.   Cardiovascular: Denies chest pain, chest tightness, palpitations or swelling in the hands or feet.  Gastrointestinal: Pt reports diarrhea. Denies abdominal pain, bloating, constipation,  or blood in the stool.  GU: Denies urgency, frequency, pain with urination, burning sensation, blood in urine, odor or discharge. Musculoskeletal: Patient reports chronic low back pain.  Denies decrease in  range of motion, difficulty with gait, muscle pain or joint  swelling.  Skin: Denies redness, rashes, lesions or ulcercations.  Neurological: Denies dizziness, difficulty with memory, difficulty with speech or problems with balance and coordination.  Psych: Patient has a history of anxiety.  Denies depression, SI/HI.  No other specific complaints in a complete review of systems (except as listed in HPI above).  Objective:   Physical Exam  BP 134/86 (BP Location: Left Arm, Patient Position: Sitting, Cuff Size: Normal)   Pulse 98   Temp (!) 96.6 F (35.9 C) (Temporal)   Ht _0  (1.778 m)   Wt 198 lb (89.8 kg)   SpO2 99%   BMI 28.41 kg/m   Wt Readings from Last 3 Encounters:  04/15/22 218 lb (98.9 kg)  01/07/22 225 lb (102.1 kg)  08/08/21 218 lb 3.2 oz (99 kg)    General: Appears her stated age, overweight, in NAD. Skin: Warm, dry and intact. No ulcerations noted. HEENT: Head: normal shape and size; Eyes: sclera white, no icterus, conjunctiva pink, PERRLA and EOMs intact;  Neck:  Neck supple, trachea midline. No masses, lumps or thyromegaly present.  Cardiovascular: Normal rate and rhythm. S1,S2 noted.  No murmur, rubs or gallops noted. No JVD or BLE edema.  Pulmonary/Chest: Normal effort and positive vesicular breath sounds. No respiratory distress. No wheezes, rales or ronchi noted.  Abdomen: Normal bowel sounds.  Musculoskeletal: Strength 5/5 BUE/BLE. No difficulty with gait.  Neurological: Alert and oriented. Cranial nerves II-XII grossly intact. Coordination normal.  Psychiatric: Mood and affect normal. Behavior is normal. Judgment and thought content normal.     BMET    Component Value Date/Time   NA 138 04/15/2022 1508   K 4.3 04/15/2022 1508   CL 102 04/15/2022 1508   CO2 24 04/15/2022 1508   GLUCOSE 157 (H) 04/15/2022 1508   BUN 10 04/15/2022 1508   CREATININE 0.91 04/15/2022 1508   CALCIUM 9.6 04/15/2022 1508   GFRNONAA 99 01/29/2021 1017   GFRAA 115  01/29/2021 1017    Lipid Panel     Component Value Date/Time   CHOL 143 04/15/2022 1508   TRIG 132 04/15/2022 1508   HDL 37 (L) 04/15/2022 1508   CHOLHDL 3.9 04/15/2022 1508   VLDL 20.2 01/09/2020 0910   LDLCALC 83 04/15/2022 1508    CBC    Component Value Date/Time   WBC 15.3 (H) 04/15/2022 1508   RBC 5.22 (H) 04/15/2022 1508   HGB 15.2 04/15/2022 1508   HCT 43.9 04/15/2022 1508   PLT 346 04/15/2022 1508   MCV 84.1 04/15/2022 1508   MCH 29.1 04/15/2022 1508   MCHC 34.6 04/15/2022 1508   RDW 12.8 04/15/2022 1508  LYMPHSABS 3,692 01/07/2022 1533   MONOABS 0.3 01/09/2020 0910   EOSABS 150 01/07/2022 1533   BASOSABS 92 01/07/2022 1533    Hgb A1C Lab Results  Component Value Date   HGBA1C 7.3 (A) 04/15/2022           Assessment & Plan:   Preventative Health Maintenance:  Flu shot UTD Tetanus UTD Encouraged her to get her COVID booster Pneumovax UTD Pap smear UTD Mammogram ordered-she will call to schedule Encouraged her to consume a balanced diet and exercise regimen Advised her to see an eye doctor and dentist annually We will check CBC, c-Met, lipid, A1c and urine microalbumin today  RTC in 6 months, follow-up chronic conditions Webb Silversmith, NP

## 2022-08-08 NOTE — Assessment & Plan Note (Signed)
Encouraged diet and exercise for weight loss ?

## 2022-08-08 NOTE — Patient Instructions (Signed)

## 2022-08-09 LAB — COMPLETE METABOLIC PANEL WITH GFR
AG Ratio: 1.9 (calc) (ref 1.0–2.5)
ALT: 19 U/L (ref 6–29)
AST: 14 U/L (ref 10–30)
Albumin: 4.1 g/dL (ref 3.6–5.1)
Alkaline phosphatase (APISO): 71 U/L (ref 31–125)
BUN/Creatinine Ratio: 8 (calc) (ref 6–22)
BUN: 9 mg/dL (ref 7–25)
CO2: 29 mmol/L (ref 20–32)
Calcium: 9.6 mg/dL (ref 8.6–10.2)
Chloride: 104 mmol/L (ref 98–110)
Creat: 1.15 mg/dL — ABNORMAL HIGH (ref 0.50–0.99)
Globulin: 2.2 g/dL (calc) (ref 1.9–3.7)
Glucose, Bld: 115 mg/dL — ABNORMAL HIGH (ref 65–99)
Potassium: 4.4 mmol/L (ref 3.5–5.3)
Sodium: 138 mmol/L (ref 135–146)
Total Bilirubin: 0.3 mg/dL (ref 0.2–1.2)
Total Protein: 6.3 g/dL (ref 6.1–8.1)
eGFR: 61 mL/min/{1.73_m2} (ref >=60)

## 2022-08-09 LAB — HEMOGLOBIN A1C
Hgb A1c MFr Bld: 6.1 % of total Hgb — ABNORMAL HIGH (ref <5.7)
Mean Plasma Glucose: 128 mg/dL
eAG (mmol/L): 7.1 mmol/L

## 2022-08-09 LAB — CBC
HCT: 42.2 % (ref 35.0–45.0)
Hemoglobin: 14.3 g/dL (ref 11.7–15.5)
MCH: 28.9 pg (ref 27.0–33.0)
MCHC: 33.9 g/dL (ref 32.0–36.0)
MCV: 85.4 fL (ref 80.0–100.0)
MPV: 11.6 fL (ref 7.5–12.5)
Platelets: 335 10*3/uL (ref 140–400)
RBC: 4.94 10*6/uL (ref 3.80–5.10)
RDW: 12.1 % (ref 11.0–15.0)
WBC: 10.8 10*3/uL (ref 3.8–10.8)

## 2022-08-09 LAB — LIPID PANEL
Cholesterol: 155 mg/dL (ref <200)
HDL: 37 mg/dL — ABNORMAL LOW (ref >=50)
LDL Cholesterol (Calc): 96 mg/dL (calc)
Non-HDL Cholesterol (Calc): 118 mg/dL (calc) (ref <130)
Total CHOL/HDL Ratio: 4.2 (calc) (ref <5.0)
Triglycerides: 123 mg/dL (ref <150)

## 2022-08-09 LAB — MICROALBUMIN / CREATININE URINE RATIO
Creatinine, Urine: 207 mg/dL (ref 20–275)
Microalb Creat Ratio: 2 mcg/mg creat (ref <30)
Microalb, Ur: 0.5 mg/dL

## 2022-08-11 ENCOUNTER — Encounter: Payer: Self-pay | Admitting: Internal Medicine

## 2022-09-02 ENCOUNTER — Encounter: Payer: Self-pay | Admitting: Internal Medicine

## 2022-09-02 MED ORDER — ACYCLOVIR 5 % EX OINT: 1.0000 | TOPICAL_OINTMENT | Freq: Three times a day (TID) | CUTANEOUS | 0 refills | Status: DC | PRN

## 2022-09-09 ENCOUNTER — Other Ambulatory Visit: Payer: Self-pay | Admitting: Internal Medicine

## 2022-09-09 DIAGNOSIS — E1169 Type 2 diabetes mellitus with other specified complication: Secondary | ICD-10-CM

## 2022-09-09 NOTE — Telephone Encounter (Signed)
Requested Prescriptions  Pending Prescriptions Disp Refills   metFORMIN (GLUCOPHAGE) 850 MG tablet [Pharmacy Med Name: METFORMIN 850MG  TABLETS] 180 tablet 0    Sig: Take 1 tablet (850 mg total) by mouth daily with breakfast.     Endocrinology:  Diabetes - Biguanides Failed - 09/09/2022 10:38 AM      Failed - Cr in normal range and within 360 days    Creat  Date Value Ref Range Status  08/08/2022 1.15 (H) 0.50 - 0.99 mg/dL Final   Creatinine, POC  Date Value Ref Range Status  03/09/2018 100 mg/dL Final   Creatinine,U  Date Value Ref Range Status  10/11/2019 237.7 mg/dL Final   Creatinine, Urine  Date Value Ref Range Status  08/08/2022 207 20 - 275 mg/dL Final         Failed - B12 Level in normal range and within 720 days    No results found for: "VITAMINB12"       Passed - HBA1C is between 0 and 7.9 and within 180 days    HbA1c POC (<> result, manual entry)  Date Value Ref Range Status  10/06/2018 8.1 4.0 - 5.6 % Final   Hgb A1c MFr Bld  Date Value Ref Range Status  08/08/2022 6.1 (H) <5.7 % of total Hgb Final    Comment:    For someone without known diabetes, a hemoglobin  A1c value between 5.7% and 6.4% is consistent with prediabetes and should be confirmed with a  follow-up test. . For someone with known diabetes, a value <7% indicates that their diabetes is well controlled. A1c targets should be individualized based on duration of diabetes, age, comorbid conditions, and other considerations. . This assay result is consistent with an increased risk of diabetes. . Currently, no consensus exists regarding use of hemoglobin A1c for diagnosis of diabetes for children. .          Passed - eGFR in normal range and within 360 days    GFR, Est African American  Date Value Ref Range Status  01/29/2021 115 > OR = 60 mL/min/1.92m2 Final   GFR, Est Non African American  Date Value Ref Range Status  01/29/2021 99 > OR = 60 mL/min/1.78m2 Final   GFR  Date Value  Ref Range Status  10/11/2019 77.01 >60.00 mL/min Final   eGFR  Date Value Ref Range Status  08/08/2022 61 > OR = 60 mL/min/1.85m2 Final         Passed - Valid encounter within last 6 months    Recent Outpatient Visits           1 month ago Encounter for general adult medical examination with abnormal findings   [facility] [place], Coralie Keens, NP   4 months ago Type 2 diabetes mellitus with other specified complication, without long-term current use of insulin [facility])   [facility] [place], [place] W, NP   8 months ago Type 2 diabetes mellitus with other specified complication, without long-term current use of insulin [facility])   [facility] [place], [place] W, NP   1 year ago Type 2 diabetes mellitus with hyperglycemia, without long-term current use of insulin [facility])   [place], Coralie Keens, NP   1 year ago Encounter for general adult medical examination with abnormal findings   [place], NP              Passed - CBC within normal limits  and completed in the last 12 months    WBC  Date Value Ref Range Status  08/08/2022 10.8 3.8 - 10.8 Thousand/uL Final   RBC  Date Value Ref Range Status  08/08/2022 4.94 3.80 - 5.10 Million/uL Final   Hemoglobin  Date Value Ref Range Status  08/08/2022 14.3 11.7 - 15.5 g/dL Final   HCT  Date Value Ref Range Status  08/08/2022 42.2 35.0 - 45.0 % Final   MCHC  Date Value Ref Range Status  08/08/2022 33.9 32.0 - 36.0 g/dL Final   [facility]  Date Value Ref Range Status  08/08/2022 28.9 27.0 - 33.0 pg Final   MCV  Date Value Ref Range Status  08/08/2022 85.4 80.0 - 100.0 fL Final   No results found for: "PLTCOUNTKUC", "LABPLAT", "POCPLA" RDW  Date Value Ref Range Status  08/08/2022 12.1 11.0 - 15.0 % Final

## 2022-09-10 ENCOUNTER — Encounter: Payer: Self-pay | Admitting: Internal Medicine

## 2022-09-15 MED ORDER — TIRZEPATIDE 7.5 MG/0.5ML ~~LOC~~ SOAJ: 7.5000 mg | SUBCUTANEOUS | 0 refills | Status: DC

## 2022-09-26 ENCOUNTER — Other Ambulatory Visit: Payer: Self-pay | Admitting: Internal Medicine

## 2022-09-26 DIAGNOSIS — Z1231 Encounter for screening mammogram for malignant neoplasm of breast: Secondary | ICD-10-CM

## 2022-09-29 ENCOUNTER — Ambulatory Visit
Admission: RE | Admit: 2022-09-29 | Discharge: 2022-09-29 | Disposition: A | Discharge status: Home / Self Care | Payer: Commercial Managed Care - PPO | Source: Ambulatory Visit

## 2022-09-29 DIAGNOSIS — Z1231 Encounter for screening mammogram for malignant neoplasm of breast: Secondary | ICD-10-CM

## 2022-10-10 ENCOUNTER — Encounter: Payer: Self-pay | Admitting: Internal Medicine

## 2022-10-10 ENCOUNTER — Telehealth: Payer: Commercial Managed Care - PPO | Admitting: Internal Medicine

## 2022-10-10 NOTE — Progress Notes (Deleted)
Virtual Visit via Video Note  I connected with Hannah Foley on 10/10/22 at 11:20 AM EST by a video enabled telemedicine application and verified that I am speaking with the correct person using two identifiers.  Location: Patient: Work Provider: Office  Persons participating in this video call: Webb Silversmith, NP and Sande Rives.  I discussed the limitations of evaluation and management by telemedicine and the availability of in person appointments. The patient expressed understanding and agreed to proceed.  History of Present Illness:  Patient reports a mass of her left thumb.  She noticed this.  The area is painful at times.   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 15 g 0   albuterol (VENTOLIN HFA) 108 (90 Base) MCG/ACT inhaler Inhale into the lungs.     atorvastatin (LIPITOR) 20 MG tablet TAKE 1 TABLET(20 MG) BY MOUTH DAILY AT 6 PM (Patient not taking: Reported on 08/08/2022) 90 tablet 2   buPROPion (WELLBUTRIN SR) 150 MG 12 hr tablet Take 1 tablet (150 mg total) by mouth 2 (two) times daily. 180 tablet 0   glipiZIDE (GLUCOTROL) 10 MG tablet TAKE 1 TABLET(10 MG) BY MOUTH TWICE DAILY BEFORE A MEAL (Patient not taking: Reported on 08/08/2022) 180 tablet 0   losartan (COZAAR) 25 MG tablet Take 1 tablet (25 mg total) by mouth daily. (Patient not taking: Reported on 08/08/2022) 90 tablet 1   meloxicam (MOBIC) 7.5 MG tablet Take 1 tablet (7.5 mg total) by mouth daily. (Patient taking differently: Take 7.5 mg by mouth daily as needed.) 90 tablet 0   metFORMIN (GLUCOPHAGE) 850 MG tablet Take 1 tablet (850 mg total) by mouth daily with breakfast. 180 tablet 0   tirzepatide (MOUNJARO) 7.5 MG/0.5ML Pen Inject 7.5 mg into the  skin once a week. 6 mL 0   No current facility-administered medications for this visit.    No Known Allergies  Family History  Problem Relation Age of Onset   Breast cancer Mother 72   Healthy Father    Healthy Brother    Breast cancer Paternal Aunt 43   Cancer Other        breast    Social History   Socioeconomic History   Marital status: Single    Spouse name: Not on file   Number of children: 0   Years of education: Not on file   Highest education level: Not on file  Occupational History   Occupation: Education officer, museum, Production designer, theatre/television/film memory care    Employer: [employer]  Tobacco Use   Smoking status: Every Day    Packs/day: 1.00    Types: Cigarettes   Smokeless tobacco: Never  Vaping Use   Vaping Use: Never used  Substance and Sexual Activity   Alcohol use: Yes    Alcohol/week: 0.0 standard drinks of alcohol    Comment: occasional   Drug use: No   Sexual activity: Not Currently  Other Topics Concern   Not on file  Social History Narrative   Not on file   Social Determinants of Health   Financial Resource Strain: Not on file  Food Insecurity: Not on file  Transportation Needs: Not on file  Physical Activity: Not on file  Stress: Not on file  Social Connections: Not on file  Intimate Partner Violence: Not on file     Constitutional: Denies fever, malaise, fatigue, headache or abrupt weight changes.  HEENT: Denies eye pain, eye redness, ear pain, ringing in the ears, wax buildup, runny nose, nasal congestion, bloody nose, or sore throat. Respiratory: Denies difficulty breathing, shortness of breath, cough or sputum production.   Cardiovascular: Denies chest pain, chest tightness, palpitations or swelling in the hands or feet.  Gastrointestinal: Denies abdominal pain, bloating, constipation, diarrhea or blood in the stool.  GU: Denies urgency, frequency, pain with urination, burning sensation, blood in urine, odor or discharge. Musculoskeletal: Patient reports  mass of left thumb.  Denies decrease in range of motion, difficulty with gait, muscle pain or joint swelling.  Skin: Denies redness, rashes, lesions or ulcercations.  Neurological: Denies dizziness, difficulty with memory, difficulty with speech or problems with balance and coordination.  Psych: Denies anxiety, depression, SI/HI.  No other specific complaints in a complete review of systems (except as listed in HPI above).    Observations/Objective:  There were no vitals taken for this visit. Wt Readings from Last 3 Encounters:  08/08/22 198 lb (89.8 kg)  04/15/22 218 lb (98.9 kg)  01/07/22 225 lb (102.1 kg)    General: Appears her stated age, well developed, well nourished in NAD. Skin: Warm, dry and intact. No rashes, lesions or ulcerations noted. Pulmonary/Chest: Normal effort. No respiratory distress.  Musculoskeletal:  Neurological: Alert and oriented.  Coordination normal.    BMET    Component Value Date/Time   NA 138 08/08/2022 1518   K 4.4 08/08/2022 1518   CL 104 08/08/2022 1518   CO2 29 08/08/2022 1518   GLUCOSE 115 (H) 08/08/2022 1518   BUN 9 08/08/2022 1518   CREATININE 1.15 (H) 08/08/2022 1518   CALCIUM 9.6 08/08/2022 1518   GFRNONAA 99 01/29/2021 1017   GFRAA 115 01/29/2021 1017    Lipid Panel     Component Value Date/Time   CHOL 155 08/08/2022 1518   TRIG 123 08/08/2022 1518   HDL 37 (L) 08/08/2022 1518   CHOLHDL 4.2 08/08/2022 1518   VLDL 20.2 01/09/2020 0910   LDLCALC 96 08/08/2022 1518    CBC    Component Value Date/Time   WBC 10.8 08/08/2022 1518   RBC 4.94 08/08/2022 1518   HGB 14.3 08/08/2022 1518   HCT 42.2 08/08/2022 1518   PLT 335 08/08/2022 1518   MCV 85.4 08/08/2022 1518   MCH 28.9 08/08/2022 1518   MCHC 33.9 08/08/2022 1518   RDW 12.1 08/08/2022 1518   LYMPHSABS 3,692 01/07/2022 1533   MONOABS 0.3 01/09/2020 0910   EOSABS 150 01/07/2022 1533   BASOSABS 92 01/07/2022 1533    Hgb A1C Lab Results  Component Value Date    HGBA1C 6.1 (H) 08/08/2022       Assessment and Plan:  Mass of Left Thumb:  RTC in 4 months for follow-up of chronic conditions  Follow Up Instructions:    I discussed the assessment and treatment plan with the patient. The patient was provided an opportunity to ask questions and all were answered. The patient agreed with the plan and demonstrated an understanding of the instructions.   The patient was advised to call back or seek an in-person evaluation if the symptoms worsen or if the condition fails to improve as anticipated.   Webb Silversmith, NP

## 2022-10-14 ENCOUNTER — Telehealth (INDEPENDENT_AMBULATORY_CARE_PROVIDER_SITE_OTHER): Payer: Commercial Managed Care - PPO | Admitting: Internal Medicine

## 2022-10-14 DIAGNOSIS — R2232 Localized swelling, mass and lump, left upper limb: Secondary | ICD-10-CM | POA: Diagnosis not present

## 2022-10-14 NOTE — Progress Notes (Signed)
Virtual Visit via Video Note  I connected with Hannah Foley on 10/14/22 at  4:00 PM EST by a video enabled telemedicine application and verified that I am speaking with the correct person using two identifiers.  Location: Patient: Work Provider: Office  Persons participating in this video call: Webb Silversmith, NP and Sande Rives   I discussed the limitations of evaluation and management by telemedicine and the availability of in person appointments. The patient expressed understanding and agreed to proceed.  History of Present Illness:  Patient reports a mass of her left thumb.  She noticed this a few months ago. She does not feel like it has gotten bigger in size.  She has not noticed any drainage from the area.  She has tried to poke it with a needle and nothing drained from the area.  She reports is very tender when pressure is applied to it or she accidentally hits it.  She has tried treating it with wart treatment with minimal relief of symptoms.   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 15 g 0   albuterol (VENTOLIN HFA) 108 (90 Base) MCG/ACT inhaler Inhale into the lungs.     atorvastatin (LIPITOR) 20 MG tablet TAKE 1 TABLET(20 MG) BY MOUTH DAILY AT 6 PM (Patient not taking: Reported on 08/08/2022) 90 tablet 2   buPROPion (WELLBUTRIN SR) 150 MG 12 hr tablet Take 1 tablet (150 mg total) by mouth 2 (two) times daily. 180 tablet 0   glipiZIDE (GLUCOTROL) 10 MG tablet TAKE 1 TABLET(10 MG) BY MOUTH TWICE DAILY BEFORE A MEAL (Patient not taking: Reported on 08/08/2022) 180 tablet 0   losartan (COZAAR) 25 MG tablet Take 1 tablet (25 mg total) by mouth daily. (Patient not taking: Reported on 08/08/2022) 90 tablet 1    meloxicam (MOBIC) 7.5 MG tablet Take 1 tablet (7.5 mg total) by mouth daily. (Patient taking differently: Take 7.5 mg by mouth daily as needed.) 90 tablet 0   metFORMIN (GLUCOPHAGE) 850 MG tablet Take 1 tablet (850 mg total) by mouth daily with breakfast. 180 tablet 0   tirzepatide (MOUNJARO) 7.5 MG/0.5ML Pen Inject 7.5 mg into the skin once a week. 6 mL 0   No current facility-administered medications for this visit.    No Known Allergies  Family History  Problem Relation Age of Onset   Breast cancer Mother 60   Healthy Father    Healthy Brother    Breast cancer Paternal Aunt 17   Cancer Other        breast    Social History   Socioeconomic History   Marital status: Single    Spouse name: Not on file   Number of children: 0   Years of education: Not on file   Highest education level: Not on file  Occupational History   Occupation: Education officer, museum, Production designer, theatre/television/film memory care    Employer: [employer]  Tobacco Use   Smoking status: Every Day    Packs/day: 1.00    Types: Cigarettes   Smokeless tobacco: Never  Vaping Use   Vaping Use: Never used  Substance and Sexual Activity   Alcohol use: Yes    Alcohol/week: 0.0 standard drinks of alcohol  Comment: occasional   Drug use: No   Sexual activity: Not Currently  Other Topics Concern   Not on file  Social History Narrative   Not on file   Social Determinants of Health   Financial Resource Strain: Not on file  Food Insecurity: Not on file  Transportation Needs: Not on file  Physical Activity: Not on file  Stress: Not on file  Social Connections: Not on file  Intimate Partner Violence: Not on file     Constitutional: Denies fever, malaise, fatigue, headache or abrupt weight changes.  Respiratory: Denies difficulty breathing, shortness of breath, cough or sputum production.   Cardiovascular: Denies chest pain, chest tightness, palpitations or swelling in the hands or feet.  Musculoskeletal: Denies decrease in range of  motion, difficulty with gait, muscle pain or joint pain and swelling.  Skin: Patient reports mass of left thumb.  Denies redness, rashes, or ulcercations.  Neurological: Denies numbness, tingling, weakness or problems with coordination.    No other specific complaints in a complete review of systems (except as listed in HPI above).    Observations/Objective:   Wt Readings from Last 3 Encounters:  08/08/22 198 lb (89.8 kg)  04/15/22 218 lb (98.9 kg)  01/07/22 225 lb (102.1 kg)    General: Appears her stated age, well developed, well nourished in NAD. Skin: Warm, dry and intact.  1 cm round, raised lesion noted on the lateral aspect of the left thumb. Pulmonary/Chest: Normal effort Musculoskeletal: Normal flexion and extension noted of the left thumb. Neurological: Alert and oriented.   BMET    Component Value Date/Time   NA 138 08/08/2022 1518   K 4.4 08/08/2022 1518   CL 104 08/08/2022 1518   CO2 29 08/08/2022 1518   GLUCOSE 115 (H) 08/08/2022 1518   BUN 9 08/08/2022 1518   CREATININE 1.15 (H) 08/08/2022 1518   CALCIUM 9.6 08/08/2022 1518   GFRNONAA 99 01/29/2021 1017   GFRAA 115 01/29/2021 1017    Lipid Panel     Component Value Date/Time   CHOL 155 08/08/2022 1518   TRIG 123 08/08/2022 1518   HDL 37 (L) 08/08/2022 1518   CHOLHDL 4.2 08/08/2022 1518   VLDL 20.2 01/09/2020 0910   LDLCALC 96 08/08/2022 1518    CBC    Component Value Date/Time   WBC 10.8 08/08/2022 1518   RBC 4.94 08/08/2022 1518   HGB 14.3 08/08/2022 1518   HCT 42.2 08/08/2022 1518   PLT 335 08/08/2022 1518   MCV 85.4 08/08/2022 1518   MCH 28.9 08/08/2022 1518   MCHC 33.9 08/08/2022 1518   RDW 12.1 08/08/2022 1518   LYMPHSABS 3,692 01/07/2022 1533   MONOABS 0.3 01/09/2020 0910   EOSABS 150 01/07/2022 1533   BASOSABS 92 01/07/2022 1533    Hgb A1C Lab Results  Component Value Date   HGBA1C 6.1 (H) 08/08/2022        Assessment and Plan:  Mass of Left Thumb:  No indication  for x-ray or ultrasound as this would likely be of little benefit Referral to Dr. Peggye Ley, hand specialist at [facility] for further evaluation and treatment  RTC in 4 months for follow-up of chronic conditions Follow Up Instructions:    I discussed the assessment and treatment plan with the patient. The patient was provided an opportunity to ask questions and all were answered. The patient agreed with the plan and demonstrated an understanding of the instructions.   The patient was advised to call back or seek an in-person  evaluation if the symptoms worsen or if the condition fails to improve as anticipated.   Webb Silversmith, NP

## 2022-11-02 ENCOUNTER — Encounter: Payer: Self-pay | Admitting: Internal Medicine

## 2022-12-16 ENCOUNTER — Encounter: Payer: Self-pay | Admitting: Internal Medicine

## 2022-12-16 MED ORDER — TIRZEPATIDE 10 MG/0.5ML ~~LOC~~ SOAJ: 10.0000 mg | SUBCUTANEOUS | 0 refills | Status: DC

## 2023-04-03 ENCOUNTER — Encounter: Payer: Self-pay | Admitting: Internal Medicine

## 2023-04-06 ENCOUNTER — Ambulatory Visit: Payer: Self-pay

## 2023-04-17 ENCOUNTER — Ambulatory Visit: Payer: Commercial Managed Care - PPO | Admitting: Internal Medicine

## 2023-04-17 ENCOUNTER — Encounter: Payer: Self-pay | Admitting: Internal Medicine

## 2023-04-17 VITALS — BP 132/74 | HR 83 | Temp 96.8°F | Wt 167.0 lb

## 2023-04-17 DIAGNOSIS — G8929 Other chronic pain: Secondary | ICD-10-CM

## 2023-04-17 DIAGNOSIS — E1165 Type 2 diabetes mellitus with hyperglycemia: Secondary | ICD-10-CM | POA: Diagnosis not present

## 2023-04-17 DIAGNOSIS — E785 Hyperlipidemia, unspecified: Secondary | ICD-10-CM

## 2023-04-17 DIAGNOSIS — F419 Anxiety disorder, unspecified: Secondary | ICD-10-CM

## 2023-04-17 DIAGNOSIS — I10 Essential (primary) hypertension: Secondary | ICD-10-CM | POA: Diagnosis not present

## 2023-04-17 DIAGNOSIS — E1169 Type 2 diabetes mellitus with other specified complication: Secondary | ICD-10-CM

## 2023-04-17 DIAGNOSIS — M545 Low back pain, unspecified: Secondary | ICD-10-CM

## 2023-04-17 LAB — POCT GLYCOSYLATED HEMOGLOBIN (HGB A1C): Hemoglobin A1C: 5.2 % (ref 4.0–5.6)

## 2023-04-17 NOTE — Assessment & Plan Note (Signed)
Encouraged regular stretching and core strengthening Continue meloxicam as needed

## 2023-04-17 NOTE — Assessment & Plan Note (Signed)
POCT A1c 5.2% We will check urine microalbumin Encouraged her to consume a low-carb diet Continue Mounjaro Encouraged routine eye exam Encouraged routine foot exam Encouraged her to get a flu shot in fall She declined Pneumovax today

## 2023-04-17 NOTE — Assessment & Plan Note (Signed)
Stable on bupropion Support offered

## 2023-04-17 NOTE — Patient Instructions (Signed)

## 2023-04-17 NOTE — Assessment & Plan Note (Signed)
C-Met and lipid profile today She is not taking atorvastatin as prescribed Encouraged low-fat diet

## 2023-04-17 NOTE — Assessment & Plan Note (Signed)
Controlled off meds Reinforced DASH diet

## 2023-04-17 NOTE — Progress Notes (Signed)
Subjective:    Patient ID: Hannah Foley, female    DOB: 11/30/1980, 42 y.o.   MRN: [id number]  HPI  Pt presents to the clinic today for follow up of chronic conditions.  Anxiety: Chronic, managed on buproprion. She is not currently seeing a therapist. She denies depression, SI/HI.  DM 2: Her last A1C was 6.1%, 07/2022. She is taking mounjaro as prescribed. She does not check her sugars. She checks her feet routinely. Her last eye exam was 06/2022. Flu 05/2022. Pneumovax 05/2018. Covid x 2.  Chronic low back pain: Managed with meloxicam as needed. She does not follow with orthopedics.  HLD: Her last LDL was 96, triglycerides 086, 07/2022. She is not taking atorvastatin as prescribed. She tries to consume a low fat diet.  HTN: Her BP today is 132/74. She is not taking any antihypertensive medications at this time. ECG from 06/2009 reviewed.  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 180 tablet 0   glipiZIDE (GLUCOTROL) 10 MG tablet TAKE 1 TABLET(10 MG) BY MOUTH TWICE DAILY BEFORE A MEAL (Patient not taking: Reported on 08/08/2022) 180 tablet 0   losartan (COZAAR) 25 MG tablet Take 1 tablet (25 mg total) by mouth daily. (Patient not taking: Reported on 08/08/2022) 90 tablet 1   meloxicam (MOBIC) 7.5 MG tablet Take 1 tablet (7.5 mg total) by mouth daily. (Patient taking differently: Take 7.5 mg by mouth daily as needed.) 90 tablet 0   metFORMIN (GLUCOPHAGE) 850 MG tablet Take 1 tablet (850 mg total) by mouth daily with breakfast. 180 tablet 0    tirzepatide (MOUNJARO) 10 MG/0.5ML Pen Inject 10 mg into the skin once a week. 6 mL 0   No current facility-administered medications for this visit.    No Known Allergies  Family History  Problem Relation Age of Onset   Breast cancer Mother 53   Healthy Father    Healthy Brother    Breast cancer Paternal Aunt 34   Cancer Other        breast    Social History   Socioeconomic History   Marital status: Single    Spouse name: Not on file   Number of children: 0   Years of education: Not on file   Highest education level: Master's degree (e.g., MA, MS, MEng, MEd, MSW, MBA)  Occupational History   Occupation: Child psychotherapist, Airline pilot memory care    Employer: [employer]  Tobacco Use   Smoking status: Every Day    Current packs/day: 1.00    Types: Cigarettes   Smokeless tobacco: Never  Vaping Use   Vaping status: Never Used  Substance and Sexual Activity   Alcohol use: Yes    Alcohol/week: 0.0 standard drinks of alcohol    Comment: occasional   Drug use: No   Sexual activity: Not Currently  Other Topics Concern   Not on file  Social History Narrative   Not on file   Social Determinants of Health  Financial Resource Strain: Medium Risk (04/13/2023)   Overall Financial Resource Strain (CARDIA)    Difficulty of Paying Living Expenses: Somewhat hard  Food Insecurity: No Food Insecurity (04/13/2023)   Hunger Vital Sign    Worried About Running Out of Food in the Last Year: Never true    Ran Out of Food in the Last Year: Never true  Transportation Needs: No Transportation Needs (04/13/2023)   PRAPARE - Administrator, Civil Service (Medical): No    Lack of Transportation (Non-Medical): No  Physical Activity: Insufficiently Active (04/13/2023)   Exercise Vital Sign    Days of Exercise per Week: 1 day    Minutes of Exercise per Session: 30 min  Stress: No Stress Concern Present (04/13/2023)   [employer] of Occupational Health - Occupational Stress  Questionnaire    Feeling of Stress : Not at all  Social Connections: Socially Isolated (04/13/2023)   Social Connection and Isolation Panel [NHANES]    Frequency of Communication with Friends and Family: More than three times a week    Frequency of Social Gatherings with Friends and Family: More than three times a week    Attends Religious Services: Never    Database administrator or Organizations: No    Attends Engineer, structural: Not on file    Marital Status: Never married  Intimate Partner Violence: Not on file     Constitutional: Denies fever, malaise, fatigue, headache or abrupt weight changes.  HEENT: Denies eye pain, eye redness, ear pain, ringing in the ears, wax buildup, runny nose, nasal congestion, bloody nose, or sore throat. Respiratory: Denies difficulty breathing, shortness of breath, cough or sputum production.   Cardiovascular: Denies chest pain, chest tightness, palpitations or swelling in the hands or feet.  Gastrointestinal: Denies abdominal pain, bloating, constipation, diarrhea or blood in the stool.  GU: Denies urgency, frequency, pain with urination, burning sensation, blood in urine, odor or discharge. Musculoskeletal: Patient reports chronic back pain.  Denies decrease in range of motion, difficulty with gait, or joint swelling.  Skin: Denies redness, rashes, lesions or ulcercations.  Neurological: Denies dizziness, difficulty with memory, difficulty with speech or problems with balance and coordination.  Psych: Denies anxiety, depression, SI/HI.  No other specific complaints in a complete review of systems (except as listed in HPI above).  Objective:   Physical Exam   BP 132/74 (BP Location: Left Arm, Patient Position: Sitting, Cuff Size: Normal)   Pulse 83   Temp (!) 96.8 F (36 C) (Temporal)   Wt 167 lb (75.8 kg)   SpO2 98%   BMI 23.96 kg/m   Wt Readings from Last 3 Encounters:  08/08/22 198 lb (89.8 kg)  04/15/22 218 lb (98.9 kg)   01/07/22 225 lb (102.1 kg)    General: Appears her stated age, well developed, well nourished in NAD. Skin: Warm, dry and intact. No ulcerations noted. HEENT: Head: normal shape and size; Eyes: sclera white, no icterus, conjunctiva pink, PERRLA and EOMs intact;  Cardiovascular: Normal rate and rhythm. S1,S2 noted.  No murmur, rubs or gallops noted. No JVD or BLE edema. Pulmonary/Chest: Normal effort and positive vesicular breath sounds. No respiratory distress. No wheezes, rales or ronchi noted.  Musculoskeletal: No difficulty with gait.  Neurological: Alert and oriented. Coordination normal.  Psychiatric: Mood and affect normal. Behavior is normal. Judgment and thought content normal.    BMET    Component Value Date/Time   NA 138 08/08/2022 1518   K 4.4 08/08/2022  1518   CL 104 08/08/2022 1518   CO2 29 08/08/2022 1518   GLUCOSE 115 (H) 08/08/2022 1518   BUN 9 08/08/2022 1518   CREATININE 1.15 (H) 08/08/2022 1518   CALCIUM 9.6 08/08/2022 1518   GFRNONAA 99 01/29/2021 1017   GFRAA 115 01/29/2021 1017    Lipid Panel     Component Value Date/Time   CHOL 155 08/08/2022 1518   TRIG 123 08/08/2022 1518   HDL 37 (L) 08/08/2022 1518   CHOLHDL 4.2 08/08/2022 1518   VLDL 20.2 01/09/2020 0910   LDLCALC 96 08/08/2022 1518    CBC    Component Value Date/Time   WBC 10.8 08/08/2022 1518   RBC 4.94 08/08/2022 1518   HGB 14.3 08/08/2022 1518   HCT 42.2 08/08/2022 1518   PLT 335 08/08/2022 1518   MCV 85.4 08/08/2022 1518   MCH 28.9 08/08/2022 1518   MCHC 33.9 08/08/2022 1518   RDW 12.1 08/08/2022 1518   LYMPHSABS 3,692 01/07/2022 1533   MONOABS 0.3 01/09/2020 0910   EOSABS 150 01/07/2022 1533   BASOSABS 92 01/07/2022 1533    Hgb A1C Lab Results  Component Value Date   HGBA1C 6.1 (H) 08/08/2022           Assessment & Plan:     RTC in 4 months for your annual exam Nicki Reaper, NP

## 2023-04-18 LAB — CBC
HCT: 42.9 % (ref 35.0–45.0)
Hemoglobin: 14.2 g/dL (ref 11.7–15.5)
MCH: 29.1 pg (ref 27.0–33.0)
MCHC: 33.1 g/dL (ref 32.0–36.0)
MCV: 87.9 fL (ref 80.0–100.0)
MPV: 10.7 fL (ref 7.5–12.5)
Platelets: 328 10*3/uL (ref 140–400)
RBC: 4.88 10*6/uL (ref 3.80–5.10)
RDW: 12.9 % (ref 11.0–15.0)
WBC: 7.1 10*3/uL (ref 3.8–10.8)

## 2023-04-18 LAB — COMPLETE METABOLIC PANEL WITH GFR
AG Ratio: 2.1 (calc) (ref 1.0–2.5)
ALT: 13 U/L (ref 6–29)
AST: 12 U/L (ref 10–30)
Albumin: 4.2 g/dL (ref 3.6–5.1)
Alkaline phosphatase (APISO): 65 U/L (ref 31–125)
BUN: 9 mg/dL (ref 7–25)
CO2: 25 mmol/L (ref 20–32)
Calcium: 9 mg/dL (ref 8.6–10.2)
Chloride: 105 mmol/L (ref 98–110)
Creat: 0.92 mg/dL (ref 0.50–0.99)
Globulin: 2 g/dL (ref 1.9–3.7)
Glucose, Bld: 75 mg/dL (ref 65–139)
Potassium: 4.5 mmol/L (ref 3.5–5.3)
Sodium: 137 mmol/L (ref 135–146)
Total Bilirubin: 0.4 mg/dL (ref 0.2–1.2)
Total Protein: 6.2 g/dL (ref 6.1–8.1)
eGFR: 79 mL/min/{1.73_m2} (ref >=60)

## 2023-04-18 LAB — LIPID PANEL
Cholesterol: 153 mg/dL (ref <200)
HDL: 43 mg/dL — ABNORMAL LOW (ref >=50)
LDL Cholesterol (Calc): 95 mg/dL
Non-HDL Cholesterol (Calc): 110 mg/dL (ref <130)
Total CHOL/HDL Ratio: 3.6 (calc) (ref <5.0)
Triglycerides: 60 mg/dL (ref <150)

## 2023-04-18 LAB — MICROALBUMIN / CREATININE URINE RATIO
Creatinine, Urine: 119 mg/dL (ref 20–275)
Microalb, Ur: <0.2 mg/dL

## 2023-05-06 ENCOUNTER — Other Ambulatory Visit: Payer: Self-pay | Admitting: Internal Medicine

## 2023-05-07 ENCOUNTER — Other Ambulatory Visit: Payer: Self-pay | Admitting: Internal Medicine

## 2023-05-07 ENCOUNTER — Encounter: Payer: Self-pay | Admitting: Internal Medicine

## 2023-05-07 NOTE — Telephone Encounter (Signed)
Requested medication (s) are due for refill today: Yes  Requested medication (s) are on the active medication list: Yes  Last refill:  12/16/22  Future visit scheduled: Yes  Notes to clinic:  Unable to refill per protocol, medication not assigned to the refill protocol.      Requested Prescriptions  Pending Prescriptions Disp Refills   MOUNJARO 10 MG/0.5ML Pen [Pharmacy Med Name: Greggory Keen 10MG /0.5ML INJ (4 PENS)] 6 mL 0    Sig: ADMINISTER 10 MG UNDER THE SKIN 1 TIME A WEEK     Off-Protocol Failed - 05/06/2023  6:50 AM      Failed - Medication not assigned to a protocol, review manually.      Passed - Valid encounter within last 12 months    Recent Outpatient Visits           2 weeks ago Type 2 diabetes mellitus with hyperglycemia, without long-term current use of insulin [facility])   [place] [facility] [place], Salvadore Oxford, NP   6 months ago Mass of skin of left thumb   [facility] [place], Salvadore [place], NP   9 months ago Encounter for general adult medical examination with abnormal findings   [place] [facility] [place], [place], NP   1 year ago Type 2 diabetes mellitus with other specified complication, without long-term current use of insulin [facility])   [place] [facility] [place], [place] W, NP   1 year ago Type 2 diabetes mellitus with other specified complication, without long-term current use of insulin [facility])   [place] [facility] [place], Salvadore Oxford, NP       Future Appointments             In 3 months Baity, Salvadore Oxford, NP [place] [facility] [place], [facility]

## 2023-05-08 MED ORDER — MOUNJARO 10 MG/0.5ML ~~LOC~~ SOAJ: 10.0000 mg | SUBCUTANEOUS | 0 refills | Status: DC

## 2023-05-08 NOTE — Telephone Encounter (Signed)
Reordered 05/07/23 6 ml and sent to requesting pharmacy  Requested Prescriptions  Refused Prescriptions Disp Refills   MOUNJARO 10 MG/0.5ML Pen [Pharmacy Med Name: Greggory Keen 10MG /0.5ML INJ (4 PENS)] 6 mL 0    Sig: ADMINISTER 10 MG UNDER THE SKIN 1 TIME A WEEK     Off-Protocol Failed - 05/07/2023  6:04 PM      Failed - Medication not assigned to a protocol, review manually.      Passed - Valid encounter within last 12 months    Recent Outpatient Visits           3 weeks ago Type 2 diabetes mellitus with hyperglycemia, without long-term current use of insulin [facility])   [place] [facility] [place], Salvadore Oxford, NP   6 months ago Mass of skin of left thumb   [place] [facility] [place], Salvadore Oxford, NP   9 months ago Encounter for general adult medical examination with abnormal findings   [place] [facility] [place], [place], NP   1 year ago Type 2 diabetes mellitus with other specified complication, without long-term current use of insulin [facility])   [place] [facility] [place], [place] W, NP   1 year ago Type 2 diabetes mellitus with other specified complication, without long-term current use of insulin [facility])   [place] [facility] [place], Salvadore Oxford, NP       Future Appointments             In 3 months Baity, Salvadore Oxford, NP [place] [facility], [facility]

## 2023-05-14 ENCOUNTER — Other Ambulatory Visit: Payer: Self-pay | Admitting: Internal Medicine

## 2023-05-15 NOTE — Telephone Encounter (Signed)
Requested Prescriptions  Pending Prescriptions Disp Refills   buPROPion (WELLBUTRIN SR) 150 MG 12 hr tablet [Pharmacy Med Name: BUPROPION SR 150MG  TABLETS (12 H)] 180 tablet 1    Sig: TAKE 1 TABLET(150 MG) BY MOUTH TWICE DAILY     Psychiatry: Antidepressants - bupropion Passed - 05/14/2023 10:08 AM      Passed - Cr in normal range and within 360 days    Creat  Date Value Ref Range Status  04/17/2023 0.92 0.50 - 0.99 mg/dL Final   Creatinine, POC  Date Value Ref Range Status  03/09/2018 100 mg/dL Final   Creatinine,U  Date Value Ref Range Status  10/11/2019 237.7 mg/dL Final   Creatinine, Urine  Date Value Ref Range Status  04/17/2023 119 20 - 275 mg/dL Final         Passed - AST in normal range and within 360 days    AST  Date Value Ref Range Status  04/17/2023 12 10 - 30 U/L Final         Passed - ALT in normal range and within 360 days    ALT  Date Value Ref Range Status  04/17/2023 13 6 - 29 U/L Final         Passed - Last BP in normal range    BP Readings from Last 1 Encounters:  04/17/23 132/74         Passed - Valid encounter within last 6 months    Recent Outpatient Visits           4 weeks ago Type 2 diabetes mellitus with hyperglycemia, without long-term current use of insulin [facility])   [place] [facility] [place], Salvadore Oxford, NP   7 months ago Mass of skin of left thumb   [place] [facility], Salvadore Oxford, NP   9 months ago Encounter for general adult medical examination with abnormal findings   [place] [facility] [place], [place], NP   1 year ago Type 2 diabetes mellitus with other specified complication, without long-term current use of insulin [facility])   [place] [facility] [place], [place], NP   1 year ago Type 2 diabetes mellitus with other specified complication, without long-term current use of insulin [facility])   [place] [facility] [place], Salvadore Oxford, NP        Future Appointments             In 2 months Baity, Salvadore Oxford, NP [place] [facility], [place] [facility]

## 2023-07-26 ENCOUNTER — Other Ambulatory Visit: Payer: Self-pay | Admitting: Internal Medicine

## 2023-07-27 MED ORDER — MOUNJARO 10 MG/0.5ML ~~LOC~~ SOAJ: 10.0000 mg | SUBCUTANEOUS | 0 refills | Status: DC

## 2023-07-31 ENCOUNTER — Encounter: Payer: Self-pay | Admitting: Internal Medicine

## 2023-07-31 ENCOUNTER — Ambulatory Visit (INDEPENDENT_AMBULATORY_CARE_PROVIDER_SITE_OTHER): Payer: Commercial Managed Care - PPO | Admitting: Internal Medicine

## 2023-07-31 VITALS — BP 116/68 | Ht 70.0 in | Wt 162.0 lb

## 2023-07-31 DIAGNOSIS — E1169 Type 2 diabetes mellitus with other specified complication: Secondary | ICD-10-CM

## 2023-07-31 DIAGNOSIS — M7711 Lateral epicondylitis, right elbow: Secondary | ICD-10-CM

## 2023-07-31 DIAGNOSIS — M545 Low back pain, unspecified: Secondary | ICD-10-CM

## 2023-07-31 DIAGNOSIS — I10 Essential (primary) hypertension: Secondary | ICD-10-CM | POA: Diagnosis not present

## 2023-07-31 DIAGNOSIS — E119 Type 2 diabetes mellitus without complications: Secondary | ICD-10-CM

## 2023-07-31 DIAGNOSIS — E785 Hyperlipidemia, unspecified: Secondary | ICD-10-CM

## 2023-07-31 DIAGNOSIS — R200 Anesthesia of skin: Secondary | ICD-10-CM

## 2023-07-31 DIAGNOSIS — F419 Anxiety disorder, unspecified: Secondary | ICD-10-CM

## 2023-07-31 DIAGNOSIS — G8929 Other chronic pain: Secondary | ICD-10-CM

## 2023-07-31 LAB — POCT GLYCOSYLATED HEMOGLOBIN (HGB A1C): Hemoglobin A1C: 5.2 % (ref 4.0–5.6)

## 2023-07-31 MED ORDER — NAPROXEN 500 MG PO TABS: 500.0000 mg | ORAL_TABLET | Freq: Two times a day (BID) | ORAL | 0 refills | Status: DC

## 2023-07-31 NOTE — Progress Notes (Signed)
Subjective:    Patient ID: Hannah Foley, female    DOB: [birth year], 42 y.o.   MRN: [id number]  HPI  Pt presents to the clinic today for follow up of chronic conditions.  Anxiety: Chronic, managed on buproprion. She is not currently seeing a therapist. She denies depression, SI/HI.  DM 2: Her last A1C was 5.2 %, 03/2023. She is taking mounjaro as prescribed. She does not check her sugars. She checks her feet routinely. Her last eye exam was 06/2022. Flu 05/2023. Pneumovax 05/2018. Covid x 2.  Chronic low back pain: With numbness in bilateral thighs.  Managed with meloxicam as needed. She does not follow with orthopedics.  HLD: Her last LDL was 95, triglycerides 60, 03/2023. She is not taking atorvastatin as prescribed. She tries to consume a low fat diet.  HTN: Her BP today is 116/68. She is not taking any antihypertensive medications at this time. ECG from 06/2009 reviewed.   She also reports right elbow.  This started a few months ago.  The pain is worse with movement and radiates down her forearm.  She denies numbness or tingling of her right upper extremity but has noticed some weakness when trying to grip things or hold things.  She has got a compression brace which she is worn but were reports this has not provided significant relief.  She is right-handed.  She also reports numbness in the top of her right foot.  She denies any swelling, redness or warmth.  She has not tried anything OTC for this.  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 (Patient taking differently: Take 7.5 mg by mouth daily as needed.) 90 tablet 0   tirzepatide (MOUNJARO) 10 MG/0.5ML Pen Inject 10 mg into the skin once a week. 6 mL 0   No current facility-administered medications for this visit.    No Known Allergies  Family History  Problem Relation Age of Onset   Breast cancer Mother 60   Healthy Father    Healthy Brother    Breast cancer Paternal Aunt 85   Cancer Other        breast    Social History   Socioeconomic History   Marital status: Single    Spouse name: Not on file   Number of children: 0   Years of education: Not on file   Highest education level: Master's degree (e.g., MA, MS, MEng, MEd, MSW, MBA)  Occupational History   Occupation: Child psychotherapist, Airline pilot memory care    Employer: [employer]  Tobacco Use   Smoking status: Every Day    Current packs/day: 1.00    Types: Cigarettes   Smokeless tobacco: Never  Vaping Use   Vaping status: Never Used  Substance and Sexual Activity   Alcohol use: Yes    Alcohol/week: 0.0 standard drinks of alcohol    Comment: occasional   Drug use:  No   Sexual activity: Not Currently  Other Topics Concern   Not on file  Social History Narrative   Not on file   Social Determinants of Health   Financial Resource Strain: Medium Risk (07/29/2023)   Overall Financial Resource Strain (CARDIA)    Difficulty of Paying Living Expenses: Somewhat hard  Food Insecurity: No Food Insecurity (07/29/2023)   Hunger Vital Sign    Worried About Running Out of Food in the Last Year: Never true    Ran Out of Food in the Last Year: Never true  Transportation Needs: No Transportation Needs (07/29/2023)   PRAPARE - Administrator, Civil Service (Medical): No    Lack of Transportation (Non-Medical): No  Physical Activity: Insufficiently Active (07/29/2023)   Exercise Vital Sign    Days of Exercise per Week: 3 days     Minutes of Exercise per Session: 30 min  Stress: No Stress Concern Present (07/29/2023)   [employer] of Occupational Health - Occupational Stress Questionnaire    Feeling of Stress : Not at all  Social Connections: Socially Isolated (07/29/2023)   Social Connection and Isolation Panel [NHANES]    Frequency of Communication with Friends and Family: More than three times a week    Frequency of Social Gatherings with Friends and Family: More than three times a week    Attends Religious Services: Never    Database administrator or Organizations: No    Attends Engineer, structural: Not on file    Marital Status: Never married  Intimate Partner Violence: Not on file     Constitutional: Denies fever, malaise, fatigue, headache or abrupt weight changes.  HEENT: Denies eye pain, eye redness, ear pain, ringing in the ears, wax buildup, runny nose, nasal congestion, bloody nose, or sore throat. Respiratory: Denies difficulty breathing, shortness of breath, cough or sputum production.   Cardiovascular: Denies chest pain, chest tightness, palpitations or swelling in the hands or feet.  Gastrointestinal: Denies abdominal pain, bloating, constipation, diarrhea or blood in the stool.  GU: Denies urgency, frequency, pain with urination, burning sensation, blood in urine, odor or discharge. Musculoskeletal: Patient reports intermittent back pain.  Denies decrease in range of motion, difficulty with gait, or joint swelling.  Skin: Denies redness, rashes, lesions or ulcercations.  Neurological: Pt reports numbness of right foot, bilateral thighs. Denies dizziness, difficulty with memory, difficulty with speech or problems with balance and coordination.  Psych: Patient has a history of anxiety.  Denies depression, SI/HI.  No other specific complaints in a complete review of systems (except as listed in HPI above).  Objective:   Physical Exam  BP 116/68   Ht 5\' 10"  (1.778 m)   Wt 162 lb  (73.5 kg)   LMP 07/26/2023 (Approximate)   BMI 23.24 kg/m   Wt Readings from Last 3 Encounters:  04/17/23 167 lb (75.8 kg)  08/08/22 198 lb (89.8 kg)  04/15/22 218 lb (98.9 kg)    General: Appears her stated age, well developed, well nourished in NAD. Skin: Warm, dry and intact. No ulcerations noted. HEENT: Head: normal shape and size; Eyes: sclera white, no icterus, conjunctiva pink, PERRLA and EOMs intact;  Cardiovascular: Normal rate and rhythm. S1,S2 noted.  No murmur, rubs or gallops noted. No JVD or BLE edema. Pulmonary/Chest: Normal effort and positive vesicular breath sounds. No respiratory distress. No wheezes, rales or ronchi noted.  Musculoskeletal: Pain with palpation over the lateral epicondyle.  No difficulty with gait.  Neurological: Alert  and oriented. Coordination normal.  Psychiatric: Mood and affect normal. Behavior is normal. Judgment and thought content normal.    BMET    Component Value Date/Time   NA 137 04/17/2023 1107   K 4.5 04/17/2023 1107   CL 105 04/17/2023 1107   CO2 25 04/17/2023 1107   GLUCOSE 75 04/17/2023 1107   BUN 9 04/17/2023 1107   CREATININE 0.92 04/17/2023 1107   CALCIUM 9.0 04/17/2023 1107   GFRNONAA 99 01/29/2021 1017   GFRAA 115 01/29/2021 1017    Lipid Panel     Component Value Date/Time   CHOL 153 04/17/2023 1107   TRIG 60 04/17/2023 1107   HDL 43 (L) 04/17/2023 1107   CHOLHDL 3.6 04/17/2023 1107   VLDL 20.2 01/09/2020 0910   LDLCALC 95 04/17/2023 1107    CBC    Component Value Date/Time   WBC 7.1 04/17/2023 1107   RBC 4.88 04/17/2023 1107   HGB 14.2 04/17/2023 1107   HCT 42.9 04/17/2023 1107   PLT 328 04/17/2023 1107   MCV 87.9 04/17/2023 1107   MCH 29.1 04/17/2023 1107   MCHC 33.1 04/17/2023 1107   RDW 12.9 04/17/2023 1107   LYMPHSABS 3,692 01/07/2022 1533   MONOABS 0.3 01/09/2020 0910   EOSABS 150 01/07/2022 1533   BASOSABS 92 01/07/2022 1533    Hgb A1C Lab Results  Component Value Date   HGBA1C 5.2  04/17/2023           Assessment & Plan:   Numbness of right foot:  Does not sound like sciatica but more like possibly a Morton's neuroma Referral to podiatry for further evaluation and treatment  Lateral epicondylitis:  Advised her to get a tennis elbow brace and use this Rx for naproxen 500 mg twice daily x 1 week  RTC in 6 months for annual exam Nicki Reaper, NP

## 2023-07-31 NOTE — Assessment & Plan Note (Signed)
POCT A1c 5.2% Urine microalbumin has been checked within the last year Encouraged her to consume a low-carb diet Continue Mounjaro Encouraged routine eye exam Encouraged routine foot exam Flu shot UTD She declined Pneumovax today

## 2023-07-31 NOTE — Assessment & Plan Note (Signed)
Stable on bupropion Support offered

## 2023-07-31 NOTE — Assessment & Plan Note (Signed)
Lipid profile reviewed Will discontinue atorvastatin due to nonuse Encouraged low-fat diet

## 2023-07-31 NOTE — Assessment & Plan Note (Signed)
Controlled off meds Reinforced DASH diet

## 2023-07-31 NOTE — Assessment & Plan Note (Signed)
Encouraged regular stretching and core strengthening Continue meloxicam as needed

## 2023-07-31 NOTE — Patient Instructions (Signed)
Tennis Elbow  Tennis elbow (lateral epicondylitis) is inflammation of tendons in your outer forearm, near your elbow. Tendons are tissues that connect muscle to bone. When you have tennis elbow, inflammation affects the tendons that you use to bend your wrist and move your hand up. Inflammation occurs in the lower part of the upper arm bone (humerus), where the tendons connect to the bone (lateral epicondyle). Tennis elbow often affects people who play tennis, but anyone may get the condition from repeatedly extending the wrist or turning the forearm. What are the causes? This condition is usually caused by repeatedly extending the wrist, turning the forearm, and using the hands. It can result from sports or work that requires repetitive forearm movements. In some cases, it may be caused by a sudden injury. What increases the risk? You are more likely to develop tennis elbow if you play tennis or another racket sport. You also have a higher risk if you frequently use your hands for work. Besides people who play tennis, others at greater risk include: People who use computers. Construction workers. People who work in factories. Musicians. Cooks. Cashiers. What are the signs or symptoms? Symptoms of this condition include: Pain and tenderness in the forearm and the outer part of the elbow. Pain may be felt only when using the arm, or it may be there all the time. A burning feeling that starts in the elbow and spreads down the forearm. A weak grip in the hand. How is this diagnosed? This condition is diagnosed based on your symptoms, your medical history, and a physical exam. You may also have X-rays or an MRI to: Confirm the diagnosis. Look for other issues. Check for tears in the ligaments, muscles, or tendons. How is this treated? Resting and icing your arm is often the first treatment. Your health care provider may also recommend: Medicines to reduce pain and inflammation. These may be in  the form of a pill, topical gels, or shots of a steroid medicine (cortisone). An elbow strap to reduce stress on the area. Physical therapy. This may include massage or exercises or both. An elbow brace to restrict the movements that cause symptoms. If these treatments do not help relieve your symptoms, your health care provider may recommend surgery to remove damaged muscle and reattach healthy muscle to bone. Follow these instructions at home: If you have a brace or strap: Wear the brace or strap as told by your health care provider. Remove it only as told by your health care provider. Check the skin around the brace or strap every day. Tell your health care provider about any concerns. Loosen the brace if your fingers tingle, become numb, or turn cold and blue. Keep the brace clean. If the brace or strap is not waterproof: Do not let it get wet. Cover it with a watertight covering when you take a bath or a shower. Managing pain, stiffness, and swelling  If directed, put ice on the injured area. To do this: If you have a removable brace or strap, remove it as told by your health care provider. Put ice in a plastic bag. Place a towel between your skin and the bag. Leave the ice on for 20 minutes, 2-3 times a day. Remove the ice if your skin turns bright red. This is very important. If you cannot feel pain, heat, or cold, you have a greater risk of damage to the area. Move your fingers often to reduce stiffness and swelling. Activity Rest your elbow   and wrist and avoid activities that cause symptoms as told by your health care provider. Do physical therapy exercises as told by your health care provider. If you lift an object, lift it with your palm facing up. This reduces stress on your elbow. Lifestyle If your tennis elbow is caused by sports, check your equipment and make sure that: You use it correctly. It is good match for you. If your tennis elbow is caused by work or computer  use, take frequent breaks to stretch your arm. Talk with your employer about ways to manage your condition at work. General instructions Take over-the-counter and prescription medicines only as told by your health care provider. Do not use any products that contain nicotine or tobacco. These products include cigarettes, chewing tobacco, and vaping devices, such as e-cigarettes. If you need help quitting, ask your health care provider. Keep all follow-up visits. This is important. How is this prevented? Before and after activity: Warm up and stretch before being active. Cool down and stretch after being active. Give your body time to rest between periods of activity. During activity: Make sure to use equipment that fits you. If you play tennis, put power in your stroke with your lower body. Avoid using your arm only. Maintain physical fitness, including: Strength. Flexibility. Endurance. Do exercises to strengthen the forearm muscles. Contact a health care provider if: You have pain that gets worse or does not get better with treatment. You have numbness or weakness in your forearm, hand, or fingers. Get help right away if: Your pain is severe. You cannot move your wrist. Summary Tennis elbow (lateral epicondylitis) is inflammation of tendons in your outer forearm, near your elbow. Common symptoms include pain and tenderness in your forearm and the outer part of your elbow. This condition is usually caused by repeatedly extending your wrist, turning your forearm, and using your hands. The first treatment is often resting and icing your arm to relieve symptoms. Further treatment may include taking medicine, getting physical therapy, wearing a brace or strap, or having surgery. This information is not intended to replace advice given to you by your health care provider. Make sure you discuss any questions you have with your health care provider. Document Revised: 02/21/2020 Document  Reviewed: 02/21/2020 Elsevier Patient Education  2024 Elsevier Inc.  

## 2023-08-10 ENCOUNTER — Encounter: Payer: Commercial Managed Care - PPO | Admitting: Internal Medicine

## 2023-08-17 ENCOUNTER — Other Ambulatory Visit: Payer: Self-pay | Admitting: Internal Medicine

## 2023-08-17 ENCOUNTER — Encounter: Payer: Self-pay | Admitting: Internal Medicine

## 2023-08-17 ENCOUNTER — Other Ambulatory Visit: Payer: Self-pay

## 2023-08-17 MED ORDER — BUPROPION HCL ER (SR) 150 MG PO TB12: 150.0000 mg | ORAL_TABLET | Freq: Two times a day (BID) | ORAL | 1 refills | Status: DC

## 2023-08-18 NOTE — Telephone Encounter (Signed)
Requested Prescriptions  Pending Prescriptions Disp Refills   buPROPion (WELLBUTRIN SR) 150 MG 12 hr tablet [Pharmacy Med Name: BUPROPION SR 150MG  TABLETS (12 H)] 180 tablet 1    Sig: TAKE 1 TABLET(150 MG) BY MOUTH TWICE DAILY     Psychiatry: Antidepressants - bupropion Passed - 08/18/2023  1:57 PM      Passed - Cr in normal range and within 360 days    Creat  Date Value Ref Range Status  04/17/2023 0.92 0.50 - 0.99 mg/dL Final   Creatinine, POC  Date Value Ref Range Status  03/09/2018 100 mg/dL Final   Creatinine,U  Date Value Ref Range Status  10/11/2019 237.7 mg/dL Final   Creatinine, Urine  Date Value Ref Range Status  04/17/2023 119 20 - 275 mg/dL Final         Passed - AST in normal range and within 360 days    AST  Date Value Ref Range Status  04/17/2023 12 10 - 30 U/L Final         Passed - ALT in normal range and within 360 days    ALT  Date Value Ref Range Status  04/17/2023 13 6 - 29 U/L Final         Passed - Last BP in normal range    BP Readings from Last 1 Encounters:  07/31/23 116/68         Passed - Valid encounter within last 6 months    Recent Outpatient Visits           2 weeks ago Numbness of right foot   [place] [facility] [place], Salvadore Oxford, NP   4 months ago Type 2 diabetes mellitus with hyperglycemia, without long-term current use of insulin [facility])   [place] [facility] [place], Salvadore Oxford, NP   10 months ago Mass of skin of left thumb   [place] [facility] [place], Salvadore Oxford, NP   1 year ago Encounter for general adult medical examination with abnormal findings   [facility] [place], [place] W, NP   1 year ago Type 2 diabetes mellitus with other specified complication, without long-term current use of insulin [facility])   [place] [facility] [place], Salvadore Oxford, NP       Future Appointments             In 5 months Baity, Salvadore Oxford, NP [place]  [facility], [facility]

## 2023-08-31 ENCOUNTER — Ambulatory Visit: Payer: Self-pay | Admitting: Podiatry

## 2023-09-11 ENCOUNTER — Encounter: Payer: Self-pay | Admitting: Internal Medicine

## 2023-09-11 ENCOUNTER — Ambulatory Visit: Payer: Commercial Managed Care - PPO | Admitting: Internal Medicine

## 2023-09-11 VITALS — BP 118/74 | Ht 70.0 in | Wt 166.2 lb

## 2023-09-11 DIAGNOSIS — B029 Zoster without complications: Secondary | ICD-10-CM

## 2023-09-11 MED ORDER — VALACYCLOVIR HCL 1 G PO TABS: 1000.0000 mg | ORAL_TABLET | Freq: Three times a day (TID) | ORAL | 0 refills | Status: DC

## 2023-09-11 NOTE — Progress Notes (Signed)
Subjective:    Patient ID: Hannah Foley, female    DOB: [birth year], 43 y.o.   MRN: [id number]  HPI  Discussed the use of AI scribe software for clinical note transcription with the patient, who gave verbal consent to proceed.  The patient, with a history of shingles, presented with a new onset of skin changes that began earlier in the day. The skin changes, described as itching, are located in the same area as a previous shingles outbreak approximately two years prior underneath the left eye. The patient denied any associated vision changes.  The patient recently returned from vacation and reported a period of stress due to a heating system failure at home. Additionally, the patient quit smoking a few days prior to the onset of the skin changes. The patient's previous shingles outbreak was initially misdiagnosed as cellulitis due to the erythematous base and subsequent development of lesions. The patient requested a work excuse due to the potential contagious nature of the condition.       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 180 tablet 1   meloxicam (MOBIC) 7.5 MG tablet Take 1 tablet (7.5 mg total) by mouth daily. (Patient taking differently: Take 7.5 mg by mouth daily as needed.) 90 tablet 0   naproxen (NAPROSYN) 500 MG tablet Take 1 tablet (500 mg total) by mouth 2 (two) times daily with a meal. 14 tablet 0   tirzepatide (MOUNJARO) 10 MG/0.5ML Pen Inject 10 mg into the skin once a week. 6 mL 0   No current facility-administered medications for this visit.    No Known Allergies  Family History  Problem Relation Age of Onset   Breast cancer Mother 21   Healthy Father     Healthy Brother    Breast cancer Paternal Aunt 25   Cancer Other        breast    Social History   Socioeconomic History   Marital status: Single    Spouse name: Not on file   Number of children: 0   Years of education: Not on file   Highest education level: Master's degree (e.g., MA, MS, MEng, MEd, MSW, MBA)  Occupational History   Occupation: Child psychotherapist, Airline pilot memory care    Employer: [employer]  Tobacco Use   Smoking status: Every Day    Current packs/day: 1.00    Types: Cigarettes   Smokeless tobacco: Never  Vaping Use   Vaping status: Never Used  Substance and Sexual Activity   Alcohol use: Yes    Alcohol/week: 0.0 standard drinks of alcohol    Comment: occasional   Drug use: No   Sexual activity: Not Currently  Other Topics Concern   Not on file  Social History Narrative   Not on file   Social Drivers of Health   Financial Resource Strain: Medium Risk (07/29/2023)   Overall Financial Resource Strain (CARDIA)    Difficulty of Paying Living Expenses: Somewhat hard  Food Insecurity: No Food Insecurity (07/29/2023)   Hunger Vital Sign    Worried About Running Out of Food in the Last Year: Never true  Ran Out of Food in the Last Year: Never true  Transportation Needs: No Transportation Needs (07/29/2023)   PRAPARE - Administrator, Civil Service (Medical): No    Lack of Transportation (Non-Medical): No  Physical Activity: Insufficiently Active (07/29/2023)   Exercise Vital Sign    Days of Exercise per Week: 3 days    Minutes of Exercise per Session: 30 min  Stress: No Stress Concern Present (07/29/2023)   [employer] of Occupational Health - Occupational Stress Questionnaire    Feeling of Stress : Not at all  Social Connections: Socially Isolated (07/29/2023)   Social Connection and Isolation Panel [NHANES]    Frequency of Communication with Friends and Family: More than three times a week    Frequency of Social Gatherings with Friends  and Family: More than three times a week    Attends Religious Services: Never    Database administrator or Organizations: No    Attends Engineer, structural: Not on file    Marital Status: Never married  Intimate Partner Violence: Patient Declined (07/31/2023)   Humiliation, Afraid, Rape, and Kick questionnaire    Fear of Current or Ex-Partner: Patient declined    Emotionally Abused: Patient declined    Physically Abused: Patient declined    Sexually Abused: Patient declined     Constitutional: Denies fever, malaise, fatigue, headache or abrupt weight changes.  HEENT: Denies eye pain, eye redness, ear pain, ringing in the ears, wax buildup, runny nose, nasal congestion, bloody nose, or sore throat. Respiratory: Denies difficulty breathing, shortness of breath, cough or sputum production.   Cardiovascular: Denies chest pain, chest tightness, palpitations or swelling in the hands or feet.  Skin: Patient reports rash under left eye.  Denies lesions or ulcercations.    No other specific complaints in a complete review of systems (except as listed in HPI above).  Objective:   Physical Exam  BP 118/74 (BP Location: Left Arm, Patient Position: Sitting, Cuff Size: Normal)   Ht 5\' 10"  (1.778 m)   Wt 166 lb 3.2 oz (75.4 kg)   LMP  (LMP Unknown)   BMI 23.85 kg/m    Wt Readings from Last 3 Encounters:  07/31/23 162 lb (73.5 kg)  04/17/23 167 lb (75.8 kg)  08/08/22 198 lb (89.8 kg)    General: Appears her stated age, well developed, well nourished in NAD. Skin: 1 cm horizontal welt noted underneath left eye.  HEENT: Head: normal shape and size; Eyes: sclera white, no icterus, conjunctiva pink, PERRLA and EOMs intact;  Cardiovascular: Normal rate and rhythm.  Pulmonary/Chest: Normal effort and positive vesicular breath sounds.  Neurological: Alert and oriented.   BMET    Component Value Date/Time   NA 137 04/17/2023 1107   K 4.5 04/17/2023 1107   CL 105 04/17/2023 1107    CO2 25 04/17/2023 1107   GLUCOSE 75 04/17/2023 1107   BUN 9 04/17/2023 1107   CREATININE 0.92 04/17/2023 1107   CALCIUM 9.0 04/17/2023 1107   GFRNONAA 99 01/29/2021 1017   GFRAA 115 01/29/2021 1017    Lipid Panel     Component Value Date/Time   CHOL 153 04/17/2023 1107   TRIG 60 04/17/2023 1107   HDL 43 (L) 04/17/2023 1107   CHOLHDL 3.6 04/17/2023 1107   VLDL 20.2 01/09/2020 0910   LDLCALC 95 04/17/2023 1107    CBC    Component Value Date/Time   WBC 7.1 04/17/2023 1107   RBC 4.88 04/17/2023 1107  HGB 14.2 04/17/2023 1107   HCT 42.9 04/17/2023 1107   PLT 328 04/17/2023 1107   MCV 87.9 04/17/2023 1107   MCH 29.1 04/17/2023 1107   MCHC 33.1 04/17/2023 1107   RDW 12.9 04/17/2023 1107   LYMPHSABS 3,692 01/07/2022 1533   MONOABS 0.3 01/09/2020 0910   EOSABS 150 01/07/2022 1533   BASOSABS 92 01/07/2022 1533    Hgb A1C Lab Results  Component Value Date   HGBA1C 5.2 07/31/2023           Assessment & Plan:  Assessment and Plan    Herpes Zoster (Shingles) Recurrence in the same location as previous episode two years ago. No vision changes reported. Recent stressors including quitting smoking and heating issues at home. -Start Valtrex three times a day for seven days. -Advise to seek immediate ophthalmological evaluation if any vision changes occur. -Recommend Shingles vaccine once patient turns 48.  Work Restrictions Due to contagious nature of Herpes Zoster. -Provide work excuse note until Monday, with potential extension to the following Monday if lesions have not resolved.        RTC in 5 months for annual exam Nicki Reaper, NP

## 2023-09-11 NOTE — Patient Instructions (Signed)

## 2023-10-26 ENCOUNTER — Encounter: Payer: Self-pay | Admitting: Obstetrics & Gynecology

## 2023-10-26 ENCOUNTER — Ambulatory Visit (INDEPENDENT_AMBULATORY_CARE_PROVIDER_SITE_OTHER): Payer: Commercial Managed Care - PPO | Admitting: Obstetrics & Gynecology

## 2023-10-26 ENCOUNTER — Other Ambulatory Visit (HOSPITAL_COMMUNITY)
Admission: RE | Admit: 2023-10-26 | Discharge: 2023-10-26 | Disposition: A | Discharge status: Home / Self Care | Source: Ambulatory Visit | Attending: Obstetrics & Gynecology | Admitting: Obstetrics & Gynecology

## 2023-10-26 VITALS — BP 125/76 | HR 75 | Ht 70.0 in | Wt 168.0 lb

## 2023-10-26 DIAGNOSIS — Z1231 Encounter for screening mammogram for malignant neoplasm of breast: Secondary | ICD-10-CM | POA: Diagnosis not present

## 2023-10-26 DIAGNOSIS — Z1339 Encounter for screening examination for other mental health and behavioral disorders: Secondary | ICD-10-CM | POA: Diagnosis not present

## 2023-10-26 DIAGNOSIS — Z01419 Encounter for gynecological examination (general) (routine) without abnormal findings: Secondary | ICD-10-CM | POA: Diagnosis not present

## 2023-10-26 NOTE — Progress Notes (Signed)
 GYNECOLOGY ANNUAL PREVENTATIVE CARE ENCOUNTER NOTE  History:     Hannah Foley is a 43 y.o. G0P0000 female here for a routine annual gynecologic exam.  Current complaints: none.   Denies abnormal vaginal bleeding, discharge, pelvic pain, vasomotor symptoms, problems with intercourse or other gynecologic concerns.    Gynecologic History Patient's last menstrual period was 10/20/2023. Contraception: none Last Pap: 05/28/2021. Result was ASCUSwith negative HPV Last Mammogram: 09/29/2022.  Result was normal  Obstetric History OB History  Gravida Para Term Preterm AB Living  0 0 0 0 0 0  SAB IAB Ectopic Multiple Live Births  0 0 0 0 0    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    Past Surgical History:  Procedure Laterality Date   CHOLECYSTECTOMY     DIAGNOSTIC LAPAROSCOPY     Endometriosis   FOOT SURGERY     Cyst removed    Current Outpatient Medications on File Prior to Visit  Medication Sig Dispense Refill   buPROPion (WELLBUTRIN SR) 150 MG 12 hr tablet Take 1 tablet (150 mg total) by mouth 2 (two) times daily. 180 tablet 1   naproxen (NAPROSYN) 500 MG tablet Take 1 tablet (500 mg total) by mouth 2 (two) times daily with a meal. 14 tablet 0   tirzepatide (MOUNJARO) 10 MG/0.5ML Pen Inject 10 mg into the skin once a week. 6 mL 0   meloxicam (MOBIC) 7.5 MG tablet Take 1 tablet (7.5 mg total) by mouth daily. (Patient not taking: Reported on 10/26/2023) 90 tablet 0   valACYclovir (VALTREX) 1000 MG tablet Take 1 tablet (1,000 mg total) by mouth 3 (three) times daily. For shingles (Patient not taking: Reported on 10/26/2023) 21 tablet 0   No current facility-administered medications on file prior to visit.    No Known Allergies  Social History:  reports that she quit smoking about 2 months ago. Her smoking use included cigarettes. She has never used smokeless tobacco. She  reports current alcohol use. She reports that she does not use drugs.  Family History  Problem Relation Age of Onset   Breast cancer Mother 11   Healthy Father    Healthy Brother    Breast cancer Paternal Aunt 59   Cancer Other        breast    The following portions of the patient's history were reviewed and updated as appropriate: allergies, current medications, past family history, past medical history, past social history, past surgical history and problem list.  Review of Systems Pertinent items noted in HPI and remainder of comprehensive ROS otherwise negative.  Physical Exam:  BP 125/76   Pulse 75   Ht 5\' 10"  (1.778 m)   Wt 168 lb (76.2 kg)   LMP 10/20/2023   BMI 24.11 kg/m  CONSTITUTIONAL: Well-developed, well-nourished female in no acute distress.  HENT:  Normocephalic, atraumatic, External right and left ear normal.  EYES: Conjunctivae and EOM are normal. Pupils are equal, round, and reactive to light. No scleral icterus.  NECK: Normal range of motion, supple, no masses.  Normal thyroid.  SKIN: Skin is warm and dry. No rash noted. Not diaphoretic. No erythema. No pallor. MUSCULOSKELETAL: Normal range of motion. No tenderness.  No cyanosis, clubbing, or edema. NEUROLOGIC: Alert and oriented to person, place, and time. Normal reflexes, muscle tone coordination.  PSYCHIATRIC: Normal mood and affect.  Normal behavior. Normal judgment and thought content. CARDIOVASCULAR: Normal heart rate noted, regular rhythm RESPIRATORY: Clear to auscultation bilaterally. Effort and breath sounds normal, no problems with respiration noted. BREASTS: Symmetric in size. No masses, tenderness, skin changes, nipple drainage, or lymphadenopathy bilaterally. Performed in the presence of a chaperone. ABDOMEN: Soft, no distention noted.  No tenderness, rebound or guarding.  PELVIC: Normal appearing external genitalia and urethral meatus; normal appearing vaginal mucosa and cervix.  No abnormal  vaginal discharge noted.  Pap smear obtained.  Normal uterine size, no other palpable masses, no uterine or adnexal tenderness.  Performed in the presence of a chaperone.   Assessment and Plan:    1. Breast cancer screening by mammogram Mammogram scheduled for breast cancer screening. - MM 3D SCREENING MAMMOGRAM BILATERAL BREAST; Future  2. Well woman exam with routine gynecological exam (Primary) - Cytology - PAP Will follow up results of pap smear and manage accordingly. Discussed need for colon cancer screening over the age 71, patient will be 43 later this year. Colonoscopy recommended as this is the gold standard.  Also discussed Cologuard.   Emphasized that positive Cologuard tests will need to be follow up with diagnostic colonoscopy. Patient will decide and let us know her decision later. Routine preventative health maintenance measures emphasized. Please refer to After Visit Summary for other counseling recommendations.      Jaynie Collins, MD, FACOG Obstetrician & Gynecologist, [facility] for [employer], [facility]

## 2023-10-27 ENCOUNTER — Ambulatory Visit
Admission: RE | Admit: 2023-10-27 | Discharge: 2023-10-27 | Disposition: A | Discharge status: Home / Self Care | Source: Ambulatory Visit | Attending: Obstetrics & Gynecology | Admitting: Obstetrics & Gynecology

## 2023-10-27 DIAGNOSIS — Z1231 Encounter for screening mammogram for malignant neoplasm of breast: Secondary | ICD-10-CM

## 2023-10-30 ENCOUNTER — Encounter: Payer: Self-pay | Admitting: Obstetrics & Gynecology

## 2023-10-30 LAB — CYTOLOGY - PAP
Chlamydia: NEGATIVE
Comment: NEGATIVE
Comment: NEGATIVE
Comment: NEGATIVE
Comment: NEGATIVE
Comment: NEGATIVE
Comment: NORMAL
Diagnosis: NEGATIVE
Diagnosis: REACTIVE
HPV 16: NEGATIVE
HPV 18 / 45: NEGATIVE
High risk HPV: POSITIVE — AB
Neisseria Gonorrhea: NEGATIVE
Trichomonas: NEGATIVE

## 2023-11-02 ENCOUNTER — Other Ambulatory Visit: Payer: Self-pay | Admitting: *Deleted

## 2023-11-02 ENCOUNTER — Encounter: Payer: Self-pay | Admitting: Obstetrics & Gynecology

## 2023-11-02 DIAGNOSIS — R87810 Cervical high risk human papillomavirus (HPV) DNA test positive: Secondary | ICD-10-CM | POA: Insufficient documentation

## 2023-11-02 MED ORDER — METRONIDAZOLE 500 MG PO TABS: 500.0000 mg | ORAL_TABLET | Freq: Two times a day (BID) | ORAL | 0 refills | Status: AC

## 2024-01-19 ENCOUNTER — Telehealth

## 2024-01-20 ENCOUNTER — Encounter: Payer: Self-pay | Admitting: Internal Medicine

## 2024-01-20 ENCOUNTER — Telehealth: Admitting: Physician Assistant

## 2024-01-20 DIAGNOSIS — L255 Unspecified contact dermatitis due to plants, except food: Secondary | ICD-10-CM | POA: Diagnosis not present

## 2024-01-20 MED ORDER — TRIAMCINOLONE ACETONIDE 0.1 % EX CREA: 1.0000 | TOPICAL_CREAM | Freq: Two times a day (BID) | CUTANEOUS | 0 refills | Status: DC

## 2024-01-20 MED ORDER — PREDNISONE 10 MG PO TABS: ORAL_TABLET | ORAL | 0 refills | Status: DC

## 2024-01-20 NOTE — Progress Notes (Signed)
 Virtual Visit Consent   Hannah Foley, you are scheduled for a virtual visit with a  provider today. Just as with appointments in the office, your consent must be obtained to participate. Your consent will be active for this visit and any virtual visit you may have with one of our providers in the next 365 days. If you have a MyChart account, a copy of this consent can be sent to you electronically.  As this is a virtual visit, video technology does not allow for your provider to perform a traditional examination. This may limit your provider's ability to fully assess your condition. If your provider identifies any concerns that need to be evaluated in person or the need to arrange testing (such as labs, EKG, etc.), we will make arrangements to do so. Although advances in technology are sophisticated, we cannot ensure that it will always work on either your end or our end. If the connection with a video visit is poor, the visit may have to be switched to a telephone visit. With either a video or telephone visit, we are not always able to ensure that we have a secure connection.  By engaging in this virtual visit, you consent to the provision of healthcare and authorize for your insurance to be billed (if applicable) for the services provided during this visit. Depending on your insurance coverage, you may receive a charge related to this service.  I need to obtain your verbal consent now. Are you willing to proceed with your visit today? Hannah Foley has provided verbal consent on 01/20/2024 for a virtual visit (video or telephone). Hannah Foley, [place]  Date: 01/20/2024 7:32 PM   Virtual Visit via Video Note   I, Hannah Foley, connected with  DARNETTA KESSELMAN  ([id number], [birth year]) on 01/20/24 at  7:30 PM EDT by a video-enabled telemedicine application and verified that I am speaking with the correct person using two identifiers.  Location: Patient: Virtual Visit Location  Patient: Home Provider: Virtual Visit Location Provider: Home Office   I discussed the limitations of evaluation and management by telemedicine and the availability of in person appointments. The patient expressed understanding and agreed to proceed.    History of Present Illness: Hannah Foley is a 43 y.o. who identifies as a female who was assigned female at birth, and is being seen today for concern of poison ivy/oak rash  Sunday. Working in yard. LK  Left foot R leg and thigh.  Today now on her right arm and now on her jaw.  Extremely itchy.   OTC -- Benadryl, Claritin.    HPI: HPI  Problems:  Patient Active Problem List   Diagnosis Date Noted   Papanicolaou smear of cervix with positive high risk human papilloma virus (HPV) test 11/02/2023   HTN (hypertension) 01/07/2022   Hyperlipidemia associated with type 2 diabetes mellitus (HCC) 08/09/2021   Chronic low back pain 01/29/2021   Anxiety 06/09/2018   DM (diabetes mellitus) (HCC) 10/25/2010    Allergies: No Known Allergies Medications:  Current Outpatient Medications:    predniSONE  (DELTASONE ) 10 MG tablet, Take 4 tablets (40 mg total) by mouth daily with breakfast for 4 days, THEN 3 tablets (30 mg total) daily with breakfast for 4 days, THEN 2 tablets (20 mg total) daily with breakfast for 3 days, THEN 1 tablet (10 mg total) daily with breakfast for 3 days., Disp: 37 tablet, Rfl: 0   triamcinolone cream (KENALOG) 0.1 %, Apply 1 Application  topically 2 (two) times daily., Disp: 30 g, Rfl: 0   buPROPion  (WELLBUTRIN  SR) 150 MG 12 hr tablet, Take 1 tablet (150 mg total) by mouth 2 (two) times daily., Disp: 180 tablet, Rfl: 1   meloxicam  (MOBIC ) 7.5 MG tablet, Take 1 tablet (7.5 mg total) by mouth daily. (Patient not taking: Reported on 10/26/2023), Disp: 90 tablet, Rfl: 0   naproxen  (NAPROSYN ) 500 MG tablet, Take 1 tablet (500 mg total) by mouth 2 (two) times daily with a meal., Disp: 14 tablet, Rfl: 0   tirzepatide  (MOUNJARO ) 10  MG/0.5ML Pen, Inject 10 mg into the skin once a week., Disp: 6 mL, Rfl: 0   valACYclovir  (VALTREX ) 1000 MG tablet, Take 1 tablet (1,000 mg total) by mouth 3 (three) times daily. For shingles (Patient not taking: Reported on 10/26/2023), Disp: 21 tablet, Rfl: 0  Observations/Objective: Patient is well-developed, well-nourished in no acute distress.  Resting comfortably at home.  Head is normocephalic, atraumatic.  No labored breathing. Speech is clear and coherent with logical content.  Patient is alert and oriented at baseline.  Erythematous, papulovesicular rash noted on left anterior foot (linear), right lower leg (scattered), Right thigh (linear), arms (scattered) and face (scattered around jaw line)  Assessment and Plan: 1. Rhus dermatitis (Primary) - predniSONE  (DELTASONE ) 10 MG tablet; Take 4 tablets (40 mg total) by mouth daily with breakfast for 4 days, THEN 3 tablets (30 mg total) daily with breakfast for 4 days, THEN 2 tablets (20 mg total) daily with breakfast for 3 days, THEN 1 tablet (10 mg total) daily with breakfast for 3 days.  Dispense: 37 tablet; Refill: 0 - triamcinolone cream (KENALOG) 0.1 %; Apply 1 Application topically 2 (two) times daily.  Dispense: 30 g; Refill: 0  Supportive measures and OTC medications reviewed. Start prednisone  taper giving severity and widespread distribution. Triamcinolone topical per orders. Follow-up in person for any non-resolving, new or worsening symptoms despite treatment.   Follow Up Instructions: I discussed the assessment and treatment plan with the patient. The patient was provided an opportunity to ask questions and all were answered. The patient agreed with the plan and demonstrated an understanding of the instructions.  A copy of instructions were sent to the patient via MyChart unless otherwise noted below.   The patient was advised to call back or seek an in-person evaluation if the symptoms worsen or if the condition fails to improve  as anticipated.    Hannah Maillard, PA-C

## 2024-01-20 NOTE — Patient Instructions (Signed)
  [facility], thank you for joining [name], PA-C for today's virtual visit.  While this provider is not your primary care provider (PCP), if your PCP is located in our provider database this encounter information will be shared with them immediately following your visit.   A [facility] MyChart account gives you access to today's visit and all your visits, tests, and labs performed at [facility] " click here if you don't have a [facility] MyChart account or go to [email or web address]  Consent: (Patient) Hannah Foley provided verbal consent for this virtual visit at the beginning of the encounter.  Current Medications:  Current Outpatient Medications:    buPROPion  (WELLBUTRIN  SR) 150 MG 12 hr tablet, Take 1 tablet (150 mg total) by mouth 2 (two) times daily., Disp: 180 tablet, Rfl: 1   meloxicam  (MOBIC ) 7.5 MG tablet, Take 1 tablet (7.5 mg total) by mouth daily. (Patient not taking: Reported on 10/26/2023), Disp: 90 tablet, Rfl: 0   naproxen  (NAPROSYN ) 500 MG tablet, Take 1 tablet (500 mg total) by mouth 2 (two) times daily with a meal., Disp: 14 tablet, Rfl: 0   tirzepatide  (MOUNJARO ) 10 MG/0.5ML Pen, Inject 10 mg into the skin once a week., Disp: 6 mL, Rfl: 0   valACYclovir  (VALTREX ) 1000 MG tablet, Take 1 tablet (1,000 mg total) by mouth 3 (three) times daily. For shingles (Patient not taking: Reported on 10/26/2023), Disp: 21 tablet, Rfl: 0   Medications ordered in this encounter:  No orders of the defined types were placed in this encounter.    *If you need refills on other medications prior to your next appointment, please contact your pharmacy*  Follow-Up: Call back or seek an in-person evaluation if the symptoms worsen or if the condition fails to improve as anticipated.  [facility] [phone]  Other Instructions Please keep the skin clean and dry.  You can apply a topical witch hazel astringent to help calm the skin and dry up  some of the areas. Ok to continue Benadryl OTC. Take the prednisone  as directed (starting in the morning) and use the topical cream as directed. Follow-up for any non-resolving, new or worsening symptoms despite treatment.     If you have been instructed to have an in-person evaluation today at a local Urgent Care facility, please use the link below. It will take you to a list of all of our available [facility], including address, phone number and hours of operation. Please do not delay care.  [facility]  If you or a family member do not have a primary care provider, use the link below to schedule a visit and establish care. When you choose a [facility] primary care physician or advanced practice provider, you gain a long-term partner in health. Find a Primary Care Provider  Learn more about [facility] in-office and virtual care options:  - Get Care Now

## 2024-01-27 ENCOUNTER — Telehealth: Payer: Self-pay

## 2024-01-27 ENCOUNTER — Encounter: Payer: Self-pay | Admitting: Internal Medicine

## 2024-01-27 ENCOUNTER — Ambulatory Visit: Admitting: Internal Medicine

## 2024-01-27 VITALS — BP 132/78 | Ht 70.0 in | Wt 176.0 lb

## 2024-01-27 DIAGNOSIS — L255 Unspecified contact dermatitis due to plants, except food: Secondary | ICD-10-CM | POA: Diagnosis not present

## 2024-01-27 MED ORDER — METHYLPREDNISOLONE ACETATE 80 MG/ML IJ SUSP
80.0000 mg | Freq: Once | INTRAMUSCULAR | Status: AC
  Administered 2024-01-27: 80 mg via INTRAMUSCULAR

## 2024-01-27 MED ORDER — HYDROXYZINE PAMOATE 50 MG PO CAPS: 50.0000 mg | ORAL_CAPSULE | Freq: Three times a day (TID) | ORAL | 0 refills | Status: DC | PRN

## 2024-01-27 NOTE — Patient Instructions (Signed)
 Poison Ivy Dermatitis Poison ivy dermatitis is redness and soreness of the skin caused by chemicals in the leaves of the poison ivy plant. You may have very bad itching, swelling, a rash, and blisters. What are the causes? Touching a poison ivy plant. Touching something that has the chemical on it. This may include animals or objects that have come in contact with the plant. What increases the risk? Going outdoors often in wooded or [place] areas. Going outdoors without wearing protective clothing, such as closed shoes, long pants, and a long-sleeved shirt. What are the signs or symptoms?  Skin redness. Very bad itching. A rash that often includes bumps and blisters. The rash usually appears 48 hours after exposure, if you have had it before. If this is the first time you have it, the rash may not appear until a week after exposure. Swelling. This may occur if the reaction is very bad. Symptoms usually last for 1-2 weeks. The first time you get this condition, symptoms may last 3-4 weeks. How is this treated? This condition may be treated with: Hydrocortisone cream or calamine lotion to relieve itching. Oatmeal baths to soothe the skin. Medicines, such as over-the-counter antihistamine tablets. Oral steroid medicine for very bad reactions. Follow these instructions at home: Medicines Take or apply over-the-counter and prescription medicines only as told by your doctor. Use hydrocortisone cream or calamine lotion as needed to help with itching. General instructions Do not scratch or rub your skin. Put a cold, wet cloth (cold compress) on the affected areas or take baths in cool water. This will help with itching. Avoid hot baths and showers. Take oatmeal baths as needed. Use colloidal oatmeal. You can get this at a pharmacy or grocery store. Follow the instructions on the package. While you have the rash, wash your clothes right after you wear them. Check the affected area every day  for signs of infection. Check for: More redness, swelling, or pain. Fluid or blood. Warmth. Pus or a bad smell. Keep all follow-up visits. Your doctor may want to see how your skin is doing with treatment. How is this prevented?  Know what poison ivy looks like, so you can avoid it. This plant has three leaves with flowering branches on a single stem. The leaves are glossy. The leaves have uneven edges that come to a point. If you touch poison ivy, wash your skin with soap and water right away. Be sure to wash under your fingernails. When hiking or camping, wear long pants, a long-sleeved shirt, long socks, and hiking boots. You can also use a lotion on your skin that helps to prevent contact with poison ivy. If you think that your clothes or outdoor gear came in contact with poison ivy, rinse them off with a garden hose before you bring them inside your house. When doing yard work or gardening, wear gloves, long sleeves, long pants, and boots. Wash your garden tools and gloves if they come in contact with poison ivy. If you think that your pet has come into contact with poison ivy, wash them with pet shampoo and water. Make sure to wear gloves while washing your pet. Contact a doctor if: You have open sores in the rash area. You have any signs of infection. You have redness that spreads past the rash area. You have a fever. You have a rash over a large area of your body. You have a rash on your eyes, mouth, or genitals. Your rash does not get better after  a few weeks. Get help right away if: Your face swells or your eyes swell shut. You have trouble breathing. You have trouble swallowing. These symptoms may be an emergency. Do not wait to see if the symptoms will go away. Get help right away. Call 911. This information is not intended to replace advice given to you by your health care provider. Make sure you discuss any questions you have with your health care provider. Document  Revised: 01/09/2022 Document Reviewed: 01/09/2022 Elsevier Patient Education  2024 [employer].

## 2024-01-27 NOTE — Telephone Encounter (Signed)
 Copied from CRM [id number]. Topic: Clinical - Medical Advice >> Jan 27, 2024  8:20 AM Essie A wrote: Reason for CRM: Patient has poison oak and had a video visit with Jonn Nett on 01/20/24.  She is on prednisone  but she hasn't gotten any better.  Please return her call [phone] asap.

## 2024-01-27 NOTE — Progress Notes (Signed)
 Subjective:    Patient ID: Hannah Foley, female    DOB: 10-Jun-1981, 43 y.o.   MRN: [id number]  HPI  Discussed the use of AI scribe software for clinical note transcription with the patient, who gave verbal consent to proceed.  Hannah Foley is a 43 year old female who presents with a severe itchy rash unresponsive to treatment.  She has been experiencing a severe itchy rash for approximately a week and a half. The rash began after she cleaned around her fence while wearing long sleeves and shorts, which she suspects may have triggered the condition. The rash is primarily located behind her legs, around her ankles and bilateral forearms.  The itching varies in severity between different areas.  Despite using various treatments, including Benadryl, calamine lotion, oatmeal baths, and a steroid cream, she has not found lasting relief. She has also tried alternative remedies such as a soap from [place] that dries to a film, which is the only thing providing some relief.  She was initially treated through telehealth and prescribed a tapering dose of prednisone , which she started last Thursday. The regimen includes three pills for three days, followed by two pills for two days, and then one pill for one day. Despite this, she reports no significant relief from the itching. She continues to take over-the-counter Benadryl at night but only manages to sleep for about an hour before waking up due to itching.   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 180 tablet 1   meloxicam  (MOBIC ) 7.5 MG tablet Take 1 tablet (7.5 mg total) by mouth daily. (Patient not taking: Reported on 10/26/2023) 90 tablet 0    naproxen  (NAPROSYN ) 500 MG tablet Take 1 tablet (500 mg total) by mouth 2 (two) times daily with a meal. 14 tablet 0   predniSONE  (DELTASONE ) 10 MG tablet Take 4 tablets (40 mg total) by mouth daily with breakfast for 4 days, THEN 3 tablets (30 mg total) daily with breakfast for 4 days, THEN 2 tablets (20 mg total) daily with breakfast for 3 days, THEN 1 tablet (10 mg total) daily with breakfast for 3 days. 37 tablet 0   tirzepatide  (MOUNJARO ) 10 MG/0.5ML Pen Inject 10 mg into the skin once a week. 6 mL 0   triamcinolone  cream (KENALOG ) 0.1 % Apply 1 Application topically 2 (two) times daily. 30 g 0   valACYclovir  (VALTREX ) 1000 MG tablet Take 1 tablet (1,000 mg total) by mouth 3 (three) times daily. For shingles (Patient not taking: Reported on 10/26/2023) 21 tablet 0   No current facility-administered medications for this visit.    No Known Allergies  Family History  Problem Relation Age of Onset   Breast cancer Mother 11   Healthy Father    Breast cancer Maternal Aunt 39   Healthy Brother     Social History   Socioeconomic History   Marital status: Single    Spouse name: Not on file   Number of children: 0   Years of education: Not on file   Highest education level: Master's degree (e.g., MA, MS, MEng, MEd, MSW, MBA)  Occupational History  Occupation: Child psychotherapist, Customer service manager care    Employer: [employer]  Tobacco Use   Smoking status: Former    Current packs/day: 0.00    Types: Cigarettes    Quit date: 2025    Years since quitting: 0.4   Smokeless tobacco: Never  Vaping Use   Vaping status: Never Used  Substance and Sexual Activity   Alcohol use: Yes    Alcohol/week: 0.0 standard drinks of alcohol    Comment: occasional   Drug use: No   Sexual activity: Yes    Partners: Male  Other Topics Concern   Not on file  Social History Narrative   Not on file   Social Drivers of Health   Financial Resource Strain: Low Risk  (09/11/2023)   Overall Financial  Resource Strain (CARDIA)    Difficulty of Paying Living Expenses: Not hard at all  Recent Concern: Financial Resource Strain - Medium Risk (07/29/2023)   Overall Financial Resource Strain (CARDIA)    Difficulty of Paying Living Expenses: Somewhat hard  Food Insecurity: No Food Insecurity (09/11/2023)   Hunger Vital Sign    Worried About Running Out of Food in the Last Year: Never true    Ran Out of Food in the Last Year: Never true  Transportation Needs: No Transportation Needs (09/11/2023)   PRAPARE - Administrator, Civil Service (Medical): No    Lack of Transportation (Non-Medical): No  Physical Activity: Inactive (09/11/2023)   Exercise Vital Sign    Days of Exercise per Week: 0 days    Minutes of Exercise per Session: 30 min  Stress: No Stress Concern Present (09/11/2023)   [employer] of Occupational Health - Occupational Stress Questionnaire    Feeling of Stress : Only a little  Social Connections: Socially Isolated (09/11/2023)   Social Connection and Isolation Panel [NHANES]    Frequency of Communication with Friends and Family: More than three times a week    Frequency of Social Gatherings with Friends and Family: More than three times a week    Attends Religious Services: Never    Database administrator or Organizations: No    Attends Engineer, structural: Not on file    Marital Status: Never married  Intimate Partner Violence: Patient Declined (07/31/2023)   Humiliation, Afraid, Rape, and Kick questionnaire    Fear of Current or Ex-Partner: Patient declined    Emotionally Abused: Patient declined    Physically Abused: Patient declined    Sexually Abused: Patient declined     Constitutional: Denies fever, malaise, fatigue, headache or abrupt weight changes.  HEENT: Denies eye pain, eye redness, ear pain, ringing in the ears, wax buildup, runny nose, nasal congestion, bloody nose, or sore throat. Respiratory: Denies difficulty breathing, shortness  of breath, cough or sputum production.   Cardiovascular: Denies chest pain, chest tightness, palpitations or swelling in the hands or feet.  Skin: Patient reports rash and itching.     No other specific complaints in a complete review of systems (except as listed in HPI above).      Objective:   Physical Exam BP 132/78 (BP Location: Left Arm, Patient Position: Sitting, Cuff Size: Normal)   Ht 5\' 10"  (1.778 m)   Wt 176 lb (79.8 kg)   LMP 12/29/2023 (Approximate)   BMI 25.25 kg/m   Wt Readings from Last 3 Encounters:  10/26/23 168 lb (76.2 kg)  09/11/23 166 lb 3.2 oz (75.4 kg)  07/31/23 162 lb (73.5 kg)  General: Appears her stated age, well developed, well nourished in NAD. Skin: Grouped, crusted vesicular lesions noted bilateral forearms, bilateral legs and around her left ankle. Cardiovascular: Normal rate and rhythm. S1,S2 noted.  No murmur, rubs or gallops noted. Pulmonary/Chest: Normal effort and positive vesicular breath sounds. No respiratory distress. No wheezes, rales or ronchi noted.  Neurological: Alert and oriented.   BMET    Component Value Date/Time   NA 137 04/17/2023 1107   K 4.5 04/17/2023 1107   CL 105 04/17/2023 1107   CO2 25 04/17/2023 1107   GLUCOSE 75 04/17/2023 1107   BUN 9 04/17/2023 1107   CREATININE 0.92 04/17/2023 1107   CALCIUM  9.0 04/17/2023 1107   GFRNONAA 99 01/29/2021 1017   GFRAA 115 01/29/2021 1017    Lipid Panel     Component Value Date/Time   CHOL 153 04/17/2023 1107   TRIG 60 04/17/2023 1107   HDL 43 (L) 04/17/2023 1107   CHOLHDL 3.6 04/17/2023 1107   VLDL 20.2 01/09/2020 0910   LDLCALC 95 04/17/2023 1107    CBC    Component Value Date/Time   WBC 7.1 04/17/2023 1107   RBC 4.88 04/17/2023 1107   HGB 14.2 04/17/2023 1107   HCT 42.9 04/17/2023 1107   PLT 328 04/17/2023 1107   MCV 87.9 04/17/2023 1107   MCH 29.1 04/17/2023 1107   MCHC 33.1 04/17/2023 1107   RDW 12.9 04/17/2023 1107   LYMPHSABS 3,692 01/07/2022 1533    MONOABS 0.3 01/09/2020 0910   EOSABS 150 01/07/2022 1533   BASOSABS 92 01/07/2022 1533    Hgb A1C Lab Results  Component Value Date   HGBA1C 5.2 07/31/2023            Assessment & Plan:   Assessment and Plan    Contact Dermatitis Severe contact dermatitis with intense pruritus, minimal relief from current treatments. Suspected allergen exposure while cleaning. Proposed hydroxyzine for pruritus, Depo Medrol to enhance prednisone . - Administer Depo Medrol injection 80 mg IM x 1. - Prescribe hydroxyzine 50 mg every 8 hours as needed, two tablets at bedtime if needed. - Continue prednisone  taper in total course finished. - Advise raw fatback as topical remedy. - Offer work note if needed.    Schedule an appointment for your annual exam Helayne Lo, NP

## 2024-01-29 ENCOUNTER — Ambulatory Visit (INDEPENDENT_AMBULATORY_CARE_PROVIDER_SITE_OTHER): Payer: Self-pay | Admitting: Internal Medicine

## 2024-01-29 ENCOUNTER — Encounter: Payer: Self-pay | Admitting: Internal Medicine

## 2024-01-29 VITALS — BP 112/68 | Ht 70.0 in | Wt 176.0 lb

## 2024-01-29 DIAGNOSIS — Z6825 Body mass index (BMI) 25.0-25.9, adult: Secondary | ICD-10-CM

## 2024-01-29 DIAGNOSIS — E1165 Type 2 diabetes mellitus with hyperglycemia: Secondary | ICD-10-CM | POA: Diagnosis not present

## 2024-01-29 DIAGNOSIS — Z0001 Encounter for general adult medical examination with abnormal findings: Secondary | ICD-10-CM

## 2024-01-29 DIAGNOSIS — E663 Overweight: Secondary | ICD-10-CM

## 2024-01-29 MED ORDER — BUPROPION HCL ER (XL) 150 MG PO TB24: 150.0000 mg | ORAL_TABLET | Freq: Every day | ORAL | 1 refills | Status: DC

## 2024-01-29 MED ORDER — TIRZEPATIDE 5 MG/0.5ML ~~LOC~~ SOAJ: 5.0000 mg | SUBCUTANEOUS | 0 refills | Status: DC

## 2024-01-29 NOTE — Patient Instructions (Signed)

## 2024-01-29 NOTE — Assessment & Plan Note (Signed)
 Encouraged diet and exercise for weight loss ?

## 2024-01-29 NOTE — Progress Notes (Signed)
 Subjective:    Patient ID: Hannah Foley, female    DOB: 1981-01-30, 43 y.o.   MRN: [id number]  HPI  Patient presents to clinic today for her annual exam.  Flu: 06/2023 Tetanus: 08/2013 COVID: X 3 Pneumovax: 05/2018 Pap smear: 05/2021 Mammogram: 10/2023 Vision screening: annually Dentist: biannually  Diet: She does eat meat. She consumes fruits and veggies. She does eat some fried foods. She drinks mostly coke Zero Exercise: Walking  Review of Systems     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    Current Outpatient Medications  Medication Sig Dispense Refill   buPROPion  (WELLBUTRIN  SR) 150 MG 12 hr tablet Take 1 tablet (150 mg total) by mouth 2 (two) times daily. 180 tablet 1   hydrOXYzine (VISTARIL) 50 MG capsule Take 1 capsule (50 mg total) by mouth 3 (three) times daily as needed. 30 capsule 0   meloxicam  (MOBIC ) 7.5 MG tablet Take 1 tablet (7.5 mg total) by mouth daily. 90 tablet 0   naproxen  (NAPROSYN ) 500 MG tablet Take 1 tablet (500 mg total) by mouth 2 (two) times daily with a meal. 14 tablet 0   predniSONE  (DELTASONE ) 10 MG tablet Take 4 tablets (40 mg total) by mouth daily with breakfast for 4 days, THEN 3 tablets (30 mg total) daily with breakfast for 4 days, THEN 2 tablets (20 mg total) daily with breakfast for 3 days, THEN 1 tablet (10 mg total) daily with breakfast for 3 days. 37 tablet 0   tirzepatide  (MOUNJARO ) 10 MG/0.5ML Pen Inject 10 mg into the skin once a week. (Patient not taking: Reported on 01/27/2024) 6 mL 0   No current facility-administered medications for this visit.    No Known Allergies  Family History  Problem Relation Age of Onset   Breast cancer Mother 64   Healthy Father    Breast cancer Maternal Aunt 46   Healthy Brother     Social History   Socioeconomic History   Marital status: Single    Spouse name: Not on file   Number  of children: 0   Years of education: Not on file   Highest education level: Master's degree (e.g., MA, MS, MEng, MEd, MSW, MBA)  Occupational History   Occupation: Child psychotherapist, Airline pilot memory care    Employer: [employer]  Tobacco Use   Smoking status: Former    Current packs/day: 0.00    Types: Cigarettes    Quit date: 2025    Years since quitting: 0.4   Smokeless tobacco: Never  Vaping Use   Vaping status: Never Used  Substance and Sexual Activity   Alcohol use: Yes    Alcohol/week: 0.0 standard drinks of alcohol    Comment: occasional   Drug use: No   Sexual activity: Yes    Partners: Male  Other Topics Concern   Not on file  Social History Narrative   Not on file   Social Drivers of Health   Financial Resource Strain: Low Risk  (09/11/2023)   Overall Financial Resource Strain (CARDIA)    Difficulty of Paying Living Expenses: Not hard at all  Recent Concern: Financial Resource Strain - Medium Risk (07/29/2023)   Overall Financial Resource Strain (CARDIA)    Difficulty of Paying Living Expenses: Somewhat hard  Food Insecurity: No Food Insecurity (09/11/2023)   Hunger Vital Sign  Worried About Programme researcher, broadcasting/film/video in the Last Year: Never true    Ran Out of Food in the Last Year: Never true  Transportation Needs: No Transportation Needs (09/11/2023)   PRAPARE - Administrator, Civil Service (Medical): No    Lack of Transportation (Non-Medical): No  Physical Activity: Inactive (09/11/2023)   Exercise Vital Sign    Days of Exercise per Week: 0 days    Minutes of Exercise per Session: 30 min  Stress: No Stress Concern Present (09/11/2023)   [employer] of Occupational Health - Occupational Stress Questionnaire    Feeling of Stress : Only a little  Social Connections: Socially Isolated (09/11/2023)   Social Connection and Isolation Panel [NHANES]    Frequency of Communication with Friends and Family: More than three times a week    Frequency of Social  Gatherings with Friends and Family: More than three times a week    Attends Religious Services: Never    Database administrator or Organizations: No    Attends Engineer, structural: Not on file    Marital Status: Never married  Intimate Partner Violence: Patient Declined (07/31/2023)   Humiliation, Afraid, Rape, and Kick questionnaire    Fear of Current or Ex-Partner: Patient declined    Emotionally Abused: Patient declined    Physically Abused: Patient declined    Sexually Abused: Patient declined     Constitutional: Denies fever, malaise, fatigue, headache or abrupt weight changes.  HEENT: Denies eye pain, eye redness, ear pain, ringing in the ears, wax buildup, runny nose, nasal congestion, bloody nose, or sore throat. Respiratory: Denies difficulty breathing, shortness of breath, cough or sputum production.   Cardiovascular: Denies chest pain, chest tightness, palpitations or swelling in the hands or feet.  Gastrointestinal: Denies abdominal pain, bloating, constipation, diarrhea or blood in the stool.  GU: Denies urgency, frequency, pain with urination, burning sensation, blood in urine, odor or discharge. Musculoskeletal: Patient reports intermittent low back pain.  Denies decrease in range of motion, difficulty with gait, muscle pain or joint  swelling.  Skin: Patient reports rash.  Denies ulcercations.  Neurological: Denies dizziness, difficulty with memory, difficulty with speech or problems with balance and coordination.  Psych: Patient has a history of anxiety.  Denies depression, SI/HI.  No other specific complaints in a complete review of systems (except as listed in HPI above).  Objective:   Physical Exam  BP 112/68 (BP Location: Left Arm, Patient Position: Sitting, Cuff Size: Normal)   Ht 5\' 10"  (1.778 m)   Wt 176 lb (79.8 kg)   LMP 12/29/2023 (Approximate)   BMI 25.25 kg/m    Wt Readings from Last 3 Encounters:  01/27/24 176 lb (79.8 kg)  10/26/23 168  lb (76.2 kg)  09/11/23 166 lb 3.2 oz (75.4 kg)    General: Appears her stated age, overweight, in NAD. Skin: Warm, dry and intact.  Poison ivy rash has improved in the last 2 days.  No ulcerations noted. HEENT: Head: normal shape and size; Eyes: sclera white, no icterus, conjunctiva pink, PERRLA and EOMs intact;  Neck:  Neck supple, trachea midline. No masses, lumps or thyromegaly present.  Cardiovascular: Normal rate and rhythm. S1,S2 noted.  No murmur, rubs or gallops noted. No JVD or BLE edema.  Pulmonary/Chest: Normal effort and positive vesicular breath sounds. No respiratory distress. No wheezes, rales or ronchi noted.  Abdomen: Normal bowel sounds.  Musculoskeletal: Strength 5/5 BUE/BLE. No difficulty with gait.  Neurological: Alert and  oriented. Cranial nerves II-XII grossly intact. Coordination normal.  Psychiatric: Mood and affect normal. Behavior is normal. Judgment and thought content normal.     BMET    Component Value Date/Time   NA 137 04/17/2023 1107   K 4.5 04/17/2023 1107   CL 105 04/17/2023 1107   CO2 25 04/17/2023 1107   GLUCOSE 75 04/17/2023 1107   BUN 9 04/17/2023 1107   CREATININE 0.92 04/17/2023 1107   CALCIUM  9.0 04/17/2023 1107   GFRNONAA 99 01/29/2021 1017   GFRAA 115 01/29/2021 1017    Lipid Panel     Component Value Date/Time   CHOL 153 04/17/2023 1107   TRIG 60 04/17/2023 1107   HDL 43 (L) 04/17/2023 1107   CHOLHDL 3.6 04/17/2023 1107   VLDL 20.2 01/09/2020 0910   LDLCALC 95 04/17/2023 1107    CBC    Component Value Date/Time   WBC 7.1 04/17/2023 1107   RBC 4.88 04/17/2023 1107   HGB 14.2 04/17/2023 1107   HCT 42.9 04/17/2023 1107   PLT 328 04/17/2023 1107   MCV 87.9 04/17/2023 1107   MCH 29.1 04/17/2023 1107   MCHC 33.1 04/17/2023 1107   RDW 12.9 04/17/2023 1107   LYMPHSABS 3,692 01/07/2022 1533   MONOABS 0.3 01/09/2020 0910   EOSABS 150 01/07/2022 1533   BASOSABS 92 01/07/2022 1533    Hgb A1C Lab Results  Component Value  Date   HGBA1C 5.2 07/31/2023           Assessment & Plan:   Preventative Health Maintenance:  Flu shot UTD Tetanus declined Encouraged her to get her COVID booster Pneumovax UTD Prevnar 20 declined Pap smear UTD Mammogram UTD Encouraged her to consume a balanced diet and exercise regimen Advised her to see an eye doctor and dentist annually We will check CBC, c-Met, lipid, A1c today  RTC in 6 months, follow-up chronic conditions Helayne Lo, NP

## 2024-02-01 ENCOUNTER — Ambulatory Visit: Payer: Self-pay | Admitting: Internal Medicine

## 2024-03-03 ENCOUNTER — Encounter: Payer: Self-pay | Admitting: Internal Medicine

## 2024-03-03 ENCOUNTER — Other Ambulatory Visit (HOSPITAL_COMMUNITY): Payer: Self-pay

## 2024-03-03 ENCOUNTER — Telehealth: Payer: Self-pay

## 2024-03-03 MED ORDER — TIRZEPATIDE 7.5 MG/0.5ML ~~LOC~~ SOAJ: 7.5000 mg | SUBCUTANEOUS | 0 refills | Status: DC

## 2024-03-07 ENCOUNTER — Other Ambulatory Visit (HOSPITAL_COMMUNITY): Payer: Self-pay

## 2024-03-08 NOTE — Telephone Encounter (Signed)
 Pharmacy Patient Advocate Encounter  Received notification from [employer] PA that Prior Authorization for Mounjaro  has been APPROVED from 03/03/2024 to 03/02/2025

## 2024-05-20 ENCOUNTER — Other Ambulatory Visit: Payer: Self-pay | Admitting: Internal Medicine

## 2024-05-20 NOTE — Telephone Encounter (Signed)
 Requested Prescriptions  Refused Prescriptions Disp Refills   buPROPion  (WELLBUTRIN  XL) 150 MG 24 hr tablet [Pharmacy Med Name: BUPROPION  XL 150MG  TABLETS (24 H)] 90 tablet 1    Sig: TAKE 1 TABLET(150 MG) BY MOUTH DAILY     Psychiatry: Antidepressants - bupropion  Passed - 05/20/2024  4:57 PM      Passed - Cr in normal range and within 360 days    Creat  Date Value Ref Range Status  01/29/2024 0.84 0.50 - 0.99 mg/dL Final   Creatinine, POC  Date Value Ref Range Status  03/09/2018 100 mg/dL Final   Creatinine, Urine  Date Value Ref Range Status  04/17/2023 119 20 - 275 mg/dL Final         Passed - AST in normal range and within 360 days    AST  Date Value Ref Range Status  01/29/2024 12 10 - 30 U/L Final         Passed - ALT in normal range and within 360 days    ALT  Date Value Ref Range Status  01/29/2024 17 6 - 29 U/L Final         Passed - Last BP in normal range    BP Readings from Last 1 Encounters:  01/29/24 112/68         Passed - Valid encounter within last 6 months    Recent Outpatient Visits           3 months ago Encounter for general adult medical examination with abnormal findings   [facility], Angeline ORN, NP   3 months ago Contact dermatitis due to plant   [facility] [place], Angeline ORN, NP

## 2024-06-05 ENCOUNTER — Encounter: Payer: Self-pay | Admitting: Internal Medicine

## 2024-06-06 MED ORDER — TIRZEPATIDE 10 MG/0.5ML ~~LOC~~ SOAJ: 10.0000 mg | SUBCUTANEOUS | 0 refills | Status: AC

## 2024-06-16 ENCOUNTER — Encounter: Payer: Self-pay | Admitting: Internal Medicine

## 2024-06-16 ENCOUNTER — Ambulatory Visit: Admitting: Internal Medicine

## 2024-06-16 VITALS — BP 112/74 | Ht 70.0 in | Wt 192.2 lb

## 2024-06-16 DIAGNOSIS — E663 Overweight: Secondary | ICD-10-CM

## 2024-06-16 DIAGNOSIS — E1169 Type 2 diabetes mellitus with other specified complication: Secondary | ICD-10-CM

## 2024-06-16 DIAGNOSIS — I10 Essential (primary) hypertension: Secondary | ICD-10-CM

## 2024-06-16 DIAGNOSIS — Z6827 Body mass index (BMI) 27.0-27.9, adult: Secondary | ICD-10-CM

## 2024-06-16 DIAGNOSIS — M545 Low back pain, unspecified: Secondary | ICD-10-CM

## 2024-06-16 DIAGNOSIS — E1165 Type 2 diabetes mellitus with hyperglycemia: Secondary | ICD-10-CM | POA: Diagnosis not present

## 2024-06-16 DIAGNOSIS — F419 Anxiety disorder, unspecified: Secondary | ICD-10-CM | POA: Diagnosis not present

## 2024-06-16 DIAGNOSIS — G8929 Other chronic pain: Secondary | ICD-10-CM

## 2024-06-16 DIAGNOSIS — Z7985 Long-term (current) use of injectable non-insulin antidiabetic drugs: Secondary | ICD-10-CM

## 2024-06-16 DIAGNOSIS — E785 Hyperlipidemia, unspecified: Secondary | ICD-10-CM

## 2024-06-16 MED ORDER — HYDROXYZINE PAMOATE 50 MG PO CAPS: 50.0000 mg | ORAL_CAPSULE | Freq: Three times a day (TID) | ORAL | 1 refills | Status: AC | PRN

## 2024-06-16 MED ORDER — BUPROPION HCL ER (XL) 150 MG PO TB24: 150.0000 mg | ORAL_TABLET | Freq: Every day | ORAL | 0 refills | Status: AC

## 2024-06-16 NOTE — Assessment & Plan Note (Signed)
 Encouraged regular stretching and core strengthening Continue meloxicam  7.5 mg daily as needed

## 2024-06-16 NOTE — Assessment & Plan Note (Signed)
Controlled off meds Reinforced DASH diet

## 2024-06-16 NOTE — Assessment & Plan Note (Signed)
 Encouraged diet and exercise for weight loss ?

## 2024-06-16 NOTE — Patient Instructions (Signed)

## 2024-06-16 NOTE — Assessment & Plan Note (Signed)
 Stable on bupropion  150 mg XL daily Continue hydroxyzine  10 mg 3 times daily as needed, take at night if having insomnia Support offered

## 2024-06-16 NOTE — Assessment & Plan Note (Signed)
 C-Met and lipid profile today Discussed LDL goal <70 Encouraged low-fat diet

## 2024-06-16 NOTE — Progress Notes (Signed)
 Subjective:    Patient ID: Hannah Foley, female    DOB: March 23, 1981, 43 y.o.   MRN: [id number]  HPI  Pt presents to the clinic today for 41-month follow up of chronic conditions.  Anxiety: Chronic, managed on buproprion and hydroxyzine . She has been under more stress lately, has been having more trouble sleeping. She is not currently seeing a therapist. She denies depression, SI/HI.  DM 2: Her last A1C was 5.6 %, 01/2024. She is taking mounjaro  as prescribed. She does not check her sugars. She checks her feet routinely. Her last eye exam was 06/2022. Flu 05/2024. Pneumovax 05/2018. Covid x 2.  Chronic low back pain: With numbness in bilateral thighs.  Managed with meloxicam  as needed.  MRI lumbar spine from 2012 reviewed.  She does not follow with orthopedics.  HLD: Her last LDL was 93, triglycerides 66, 01/2024. She is not taking atorvastatin  as prescribed. She tries to consume a low fat diet.  HTN: Her BP today is 112/74 She is not taking any antihypertensive medications at this time. ECG from 06/2009 reviewed.   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 90 tablet 1   hydrOXYzine  (VISTARIL ) 50 MG capsule Take 1 capsule (50 mg total) by mouth 3 (three) times daily as needed. 30 capsule 0   meloxicam  (MOBIC ) 7.5 MG tablet Take 1 tablet (7.5 mg total) by mouth daily. 90 tablet 0   tirzepatide  (MOUNJARO ) 10 MG/0.5ML Pen Inject 10 mg into the skin once a week. 6 mL 0   No current facility-administered medications for this visit.    No Known Allergies  Family History  Problem Relation Age of Onset   Breast cancer Mother 67   Cancer  Mother    Stroke Mother    Healthy Father    Stroke Father    Breast cancer Maternal Aunt 85   Cancer Paternal Aunt    Healthy Brother     Social History   Socioeconomic History   Marital status: Single    Spouse name: Not on file   Number of children: 0   Years of education: Not on file   Highest education level: Master's degree (e.g., MA, MS, MEng, MEd, MSW, MBA)  Occupational History   Occupation: Child psychotherapist, Airline pilot memory care    Employer: [employer]  Tobacco Use   Smoking status: Former    Current packs/day: 0.00    Average packs/day: 1 pack/day for 15.0 years (15.0 ttl pk-yrs)    Types: Cigarettes    Quit date: 2025    Years since quitting: 0.8   Smokeless tobacco: Never  Vaping Use   Vaping status: Never Used  Substance and Sexual Activity   Alcohol use: Not Currently    Comment: occasional   Drug use: No   Sexual activity: Yes    Partners: Male    Birth control/protection: Pill  Other Topics Concern   Not on file  Social History Narrative   Not on file   Social Drivers of Health   Financial Resource Strain: Medium Risk (06/12/2024)   Overall  Financial Resource Strain (CARDIA)    Difficulty of Paying Living Expenses: Somewhat hard  Food Insecurity: No Food Insecurity (06/12/2024)   Hunger Vital Sign    Worried About Running Out of Food in the Last Year: Never true    Ran Out of Food in the Last Year: Never true  Transportation Needs: No Transportation Needs (06/12/2024)   PRAPARE - Administrator, Civil Service (Medical): No    Lack of Transportation (Non-Medical): No  Physical Activity: Insufficiently Active (06/12/2024)   Exercise Vital Sign    Days of Exercise per Week: 3 days    Minutes of Exercise per Session: 30 min  Stress: No Stress Concern Present (06/12/2024)   [employer] of Occupational Health - Occupational Stress Questionnaire    Feeling of Stress: Only a little  Social Connections: Socially Isolated  (06/12/2024)   Social Connection and Isolation Panel    Frequency of Communication with Friends and Family: More than three times a week    Frequency of Social Gatherings with Friends and Family: Three times a week    Attends Religious Services: Never    Active Member of Clubs or Organizations: No    Attends Banker Meetings: Not on file    Marital Status: Never married  Intimate Partner Violence: Patient Declined (07/31/2023)   Humiliation, Afraid, Rape, and Kick questionnaire    Fear of Current or Ex-Partner: Patient declined    Emotionally Abused: Patient declined    Physically Abused: Patient declined    Sexually Abused: Patient declined     Constitutional: Denies fever, malaise, fatigue, headache or abrupt weight changes.  HEENT: Denies eye pain, eye redness, ear pain, ringing in the ears, wax buildup, runny nose, nasal congestion, bloody nose, or sore throat. Respiratory: Denies difficulty breathing, shortness of breath, cough or sputum production.   Cardiovascular: Denies chest pain, chest tightness, palpitations or swelling in the hands or feet.  Gastrointestinal: Denies abdominal pain, bloating, constipation, diarrhea or blood in the stool.  GU: Denies urgency, frequency, pain with urination, burning sensation, blood in urine, odor or discharge. Musculoskeletal: Patient reports intermittent back pain.  Denies decrease in range of motion, difficulty with gait, or joint swelling.  Skin: Denies redness, rashes, lesions or ulcercations.  Neurological: Pt reports numbness of bilateral thighs. Denies dizziness, difficulty with memory, difficulty with speech or problems with balance and coordination.  Psych: Patient has a history of anxiety.  Denies depression, SI/HI.  No other specific complaints in a complete review of systems (except as listed in HPI above).  Objective:   Physical Exam  BP 112/74 (BP Location: Left Arm, Patient Position: Sitting, Cuff Size: Normal)    Ht 5' 10 (1.778 m)   Wt 192 lb 3.2 oz (87.2 kg)   LMP 06/12/2024 (Approximate)   BMI 27.58 kg/m    Wt Readings from Last 3 Encounters:  01/29/24 176 lb (79.8 kg)  01/27/24 176 lb (79.8 kg)  10/26/23 168 lb (76.2 kg)    General: Appears her stated age, overweight, in NAD. Skin: Warm, dry and intact. No ulcerations noted. HEENT: Head: normal shape and size; Eyes: sclera white, no icterus, conjunctiva pink, PERRLA and EOMs intact;  Cardiovascular: Normal rate and rhythm. S1,S2 noted.  No murmur, rubs or gallops noted. No JVD or BLE edema. Pulmonary/Chest: Normal effort and positive vesicular breath sounds. No respiratory distress. No wheezes, rales or ronchi noted.  Musculoskeletal: No difficulty with gait.  Neurological: Alert and oriented. Coordination normal.  Psychiatric: Mood  and affect normal. Behavior is normal. Judgment and thought content normal.    BMET    Component Value Date/Time   NA 137 01/29/2024 0835   K 4.6 01/29/2024 0835   CL 102 01/29/2024 0835   CO2 29 01/29/2024 0835   GLUCOSE 120 (H) 01/29/2024 0835   BUN 11 01/29/2024 0835   CREATININE 0.84 01/29/2024 0835   CALCIUM  9.2 01/29/2024 0835   GFRNONAA 99 01/29/2021 1017   GFRAA 115 01/29/2021 1017    Lipid Panel     Component Value Date/Time   CHOL 187 01/29/2024 0835   TRIG 66 01/29/2024 0835   HDL 79 01/29/2024 0835   CHOLHDL 2.4 01/29/2024 0835   VLDL 20.2 01/09/2020 0910   LDLCALC 93 01/29/2024 0835    CBC    Component Value Date/Time   WBC 10.5 01/29/2024 0835   RBC 4.88 01/29/2024 0835   HGB 13.8 01/29/2024 0835   HCT 43.9 01/29/2024 0835   PLT 295 01/29/2024 0835   MCV 90.0 01/29/2024 0835   MCH 28.3 01/29/2024 0835   MCHC 31.4 (L) 01/29/2024 0835   RDW 12.2 01/29/2024 0835   LYMPHSABS 3,692 01/07/2022 1533   MONOABS 0.3 01/09/2020 0910   EOSABS 150 01/07/2022 1533   BASOSABS 92 01/07/2022 1533    Hgb A1C Lab Results  Component Value Date   HGBA1C 5.6 01/29/2024            Assessment & Plan:     RTC in 8 months for your annual exam Angeline Laura, NP

## 2024-06-16 NOTE — Assessment & Plan Note (Signed)
 A1C and urine microalbumin today Encouraged her to consume a low-carb diet Continue mounjaro  10 mg weekly Encouraged routine eye exam Encouraged routine foot exam Flu shot UTD She declined Prevnar today

## 2024-06-17 ENCOUNTER — Ambulatory Visit: Payer: Self-pay | Admitting: Internal Medicine

## 2024-06-17 LAB — CBC
HCT: 41.1 % (ref 35.0–45.0)
Hemoglobin: 13.7 g/dL (ref 11.7–15.5)
MCH: 28.9 pg (ref 27.0–33.0)
MCHC: 33.3 g/dL (ref 32.0–36.0)
MCV: 86.7 fL (ref 80.0–100.0)
MPV: 10.9 fL (ref 7.5–12.5)
Platelets: 285 Thousand/uL (ref 140–400)
RBC: 4.74 Million/uL (ref 3.80–5.10)
RDW: 12.3 % (ref 11.0–15.0)
WBC: 6.4 Thousand/uL (ref 3.8–10.8)

## 2024-06-17 LAB — COMPREHENSIVE METABOLIC PANEL WITH GFR
AG Ratio: 1.8 (calc) (ref 1.0–2.5)
ALT: 8 U/L (ref 6–29)
AST: 11 U/L (ref 10–35)
Albumin: 4.3 g/dL (ref 3.6–5.1)
Alkaline phosphatase (APISO): 45 U/L (ref 31–125)
BUN: 12 mg/dL (ref 7–25)
CO2: 28 mmol/L (ref 20–32)
Calcium: 9.3 mg/dL (ref 8.6–10.2)
Chloride: 104 mmol/L (ref 98–110)
Creat: 0.97 mg/dL (ref 0.50–0.99)
Globulin: 2.4 g/dL (ref 1.9–3.7)
Glucose, Bld: 93 mg/dL (ref 65–139)
Potassium: 4.4 mmol/L (ref 3.5–5.3)
Sodium: 138 mmol/L (ref 135–146)
Total Bilirubin: 0.4 mg/dL (ref 0.2–1.2)
Total Protein: 6.7 g/dL (ref 6.1–8.1)
eGFR: 73 mL/min/1.73m2 (ref >=60)

## 2024-06-17 LAB — HEMOGLOBIN A1C
Hgb A1c MFr Bld: 5.2 % (ref <5.7)
Mean Plasma Glucose: 103 mg/dL
eAG (mmol/L): 5.7 mmol/L

## 2024-06-17 LAB — MICROALBUMIN / CREATININE URINE RATIO
Creatinine, Urine: 60 mg/dL (ref 20–275)
Microalb, Ur: <0.2 mg/dL

## 2024-06-17 LAB — LIPID PANEL
Cholesterol: 150 mg/dL (ref <200)
HDL: 46 mg/dL — ABNORMAL LOW (ref >=50)
LDL Cholesterol (Calc): 85 mg/dL
Non-HDL Cholesterol (Calc): 104 mg/dL (ref <130)
Total CHOL/HDL Ratio: 3.3 (calc) (ref <5.0)
Triglycerides: 98 mg/dL (ref <150)

## 2024-06-30 LAB — COMPREHENSIVE METABOLIC PANEL WITH GFR
AG Ratio: 1.9 (calc) (ref 1.0–2.5)
ALT: 17 U/L (ref 6–29)
AST: 12 U/L (ref 10–30)
Albumin: 4.2 g/dL (ref 3.6–5.1)
Alkaline phosphatase (APISO): 53 U/L (ref 31–125)
BUN: 11 mg/dL (ref 7–25)
CO2: 29 mmol/L (ref 20–32)
Calcium: 9.2 mg/dL (ref 8.6–10.2)
Chloride: 102 mmol/L (ref 98–110)
Creat: 0.84 mg/dL (ref 0.50–0.99)
Globulin: 2.2 g/dL (ref 1.9–3.7)
Glucose, Bld: 120 mg/dL (ref 65–139)
Potassium: 4.6 mmol/L (ref 3.5–5.3)
Sodium: 137 mmol/L (ref 135–146)
Total Bilirubin: 0.6 mg/dL (ref 0.2–1.2)
Total Protein: 6.4 g/dL (ref 6.1–8.1)
eGFR: 88 mL/min/1.73m2 (ref >=60)

## 2024-06-30 LAB — LIPID PANEL
Cholesterol: 187 mg/dL (ref <200)
HDL: 79 mg/dL (ref >=50)
LDL Cholesterol (Calc): 93 mg/dL
Non-HDL Cholesterol (Calc): 108 mg/dL (ref <130)
Total CHOL/HDL Ratio: 2.4 (calc) (ref <5.0)
Triglycerides: 66 mg/dL (ref <150)

## 2024-06-30 LAB — CBC
HCT: 43.9 % (ref 35.0–45.0)
Hemoglobin: 13.8 g/dL (ref 11.7–15.5)
MCH: 28.3 pg (ref 27.0–33.0)
MCHC: 31.4 g/dL — ABNORMAL LOW (ref 32.0–36.0)
MCV: 90 fL (ref 80.0–100.0)
MPV: 10.5 fL (ref 7.5–12.5)
Platelets: 295 Thousand/uL (ref 140–400)
RBC: 4.88 Million/uL (ref 3.80–5.10)
RDW: 12.2 % (ref 11.0–15.0)
WBC: 10.5 Thousand/uL (ref 3.8–10.8)

## 2024-06-30 LAB — HEMOGLOBIN A1C
Hgb A1c MFr Bld: 5.6 % (ref <5.7)
Mean Plasma Glucose: 114 mg/dL
eAG (mmol/L): 6.3 mmol/L

## 2024-07-20 ENCOUNTER — Encounter

## 2024-07-20 ENCOUNTER — Encounter: Payer: Self-pay | Admitting: Internal Medicine

## 2024-07-20 ENCOUNTER — Telehealth: Admitting: Emergency Medicine

## 2024-07-20 DIAGNOSIS — B029 Zoster without complications: Secondary | ICD-10-CM

## 2024-07-20 MED ORDER — VALACYCLOVIR HCL 1 G PO TABS: 1000.0000 mg | ORAL_TABLET | Freq: Three times a day (TID) | ORAL | 0 refills | Status: AC

## 2024-07-20 NOTE — Progress Notes (Signed)
 Virtual Visit Consent   LASONIA CASINO, you are scheduled for a virtual visit with a [facility] provider today. Just as with appointments in the office, your consent must be obtained to participate. Your consent will be active for this visit and any virtual visit you may have with one of our providers in the next 365 days. If you have a MyChart account, a copy of this consent can be sent to you electronically.  As this is a virtual visit, video technology does not allow for your provider to perform a traditional examination. This may limit your provider's ability to fully assess your condition. If your provider identifies any concerns that need to be evaluated in person or the need to arrange testing (such as labs, EKG, etc.), we will make arrangements to do so. Although advances in technology are sophisticated, we cannot ensure that it will always work on either your end or our end. If the connection with a video visit is poor, the visit may have to be switched to a telephone visit. With either a video or telephone visit, we are not always able to ensure that we have a secure connection.  By engaging in this virtual visit, you consent to the provision of healthcare and authorize for your insurance to be billed (if applicable) for the services provided during this visit. Depending on your insurance coverage, you may receive a charge related to this service.  I need to obtain your verbal consent now. Are you willing to proceed with your visit today? Hannah Foley has provided verbal consent on 07/20/2024 for a virtual visit (video or telephone). Jon CHRISTELLA Belt, NP  Date: 07/20/2024 11:57 AM   Virtual Visit via Video Note   I, Jon CHRISTELLA Belt, connected with  Hannah Foley  ([id number], [birth year]) on 07/20/24 at 11:45 AM EST by a video-enabled telemedicine application and verified that I am speaking with the correct person using two identifiers.  Location: Patient: Virtual Visit Location Patient:  Other: work in [employer] Provider: Virtual Visit Location Provider: Home Office   I discussed the limitations of evaluation and management by telemedicine and the availability of in person appointments. The patient expressed understanding and agreed to proceed.    History of Present Illness: Hannah Foley is a 43 y.o. who identifies as a female who was assigned female at birth, and is being seen today for shingles  Shingles L cheek in the past, last winter and prior. Last night R cheek started itching and today it tingles, feels like when she had shingles in the past but has never had on the R side before.   HPI: HPI  Problems:  Patient Active Problem List   Diagnosis Date Noted   HTN (hypertension) 01/07/2022   Hyperlipidemia associated with type 2 diabetes mellitus (HCC) 08/09/2021   Overweight with body mass index (BMI) of 27 to 27.9 in adult 08/09/2021   Chronic low back pain 01/29/2021   Anxiety 06/09/2018   DM (diabetes mellitus) (HCC) 10/25/2010    Allergies: No Known Allergies Medications:  Current Outpatient Medications:    valACYclovir  (VALTREX ) 1000 MG tablet, Take 1 tablet (1,000 mg total) by mouth 3 (three) times daily for 7 days., Disp: 21 tablet, Rfl: 0   buPROPion  (WELLBUTRIN  XL) 150 MG 24 hr tablet, Take 1 tablet (150 mg total) by mouth daily., Disp: 90 tablet, Rfl: 0   hydrOXYzine  (VISTARIL ) 50 MG capsule, Take 1 capsule (50 mg total) by mouth 3 (three) times daily as  needed., Disp: 90 capsule, Rfl: 1   meloxicam  (MOBIC ) 7.5 MG tablet, Take 1 tablet (7.5 mg total) by mouth daily. (Patient taking differently: Take 7.5 mg by mouth as needed.), Disp: 90 tablet, Rfl: 0   tirzepatide  (MOUNJARO ) 10 MG/0.5ML Pen, Inject 10 mg into the skin once a week., Disp: 6 mL, Rfl: 0  Observations/Objective: Patient is well-developed, well-nourished in no acute distress.  Resting comfortably  at home.  Head is normocephalic, atraumatic.  No labored breathing.  Speech is clear and coherent  with logical content.  Patient is alert and oriented at baseline.  I am not able to see it clearly but there may be one blister/vesicle on R cheek.   Assessment and Plan: 1. Herpes zoster without complication (Primary) - valACYclovir  (VALTREX ) 1000 MG tablet; Take 1 tablet (1,000 mg total) by mouth 3 (three) times daily for 7 days.  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 The patient was provided an opportunity to ask questions and all were answered. The patient agreed with the plan and demonstrated an understanding of the instructions.  A copy of instructions were sent to the patient via MyChart unless otherwise noted below.    The patient was advised to call back or seek an in-person evaluation if the symptoms worsen or if the condition fails to improve as anticipated.    Jon CHRISTELLA Belt, NP

## 2024-07-20 NOTE — Patient Instructions (Signed)
  Hannah Foley, thank you for joining Hannah CHRISTELLA Belt, NP for today's virtual visit.  While this provider is not your primary care provider (PCP), if your PCP is located in our provider database this encounter information will be shared with them immediately following your visit.   A [facility] MyChart account gives you access to today's visit and all your visits, tests, and labs performed at [facility]  click here if you don't have a [facility] MyChart account or go to [email or web address]  Consent: (Patient) Hannah Foley provided verbal consent for this virtual visit at the beginning of the encounter.  Current Medications:  Current Outpatient Medications:    valACYclovir  (VALTREX ) 1000 MG tablet, Take 1 tablet (1,000 mg total) by mouth 3 (three) times daily for 7 days., Disp: 21 tablet, Rfl: 0   buPROPion  (WELLBUTRIN  XL) 150 MG 24 hr tablet, Take 1 tablet (150 mg total) by mouth daily., Disp: 90 tablet, Rfl: 0   hydrOXYzine  (VISTARIL ) 50 MG capsule, Take 1 capsule (50 mg total) by mouth 3 (three) times daily as needed., Disp: 90 capsule, Rfl: 1   meloxicam  (MOBIC ) 7.5 MG tablet, Take 1 tablet (7.5 mg total) by mouth daily. (Patient taking differently: Take 7.5 mg by mouth as needed.), Disp: 90 tablet, Rfl: 0   tirzepatide  (MOUNJARO ) 10 MG/0.5ML Pen, Inject 10 mg into the skin once a week., Disp: 6 mL, Rfl: 0   Medications ordered in this encounter:  Meds ordered this encounter  Medications   valACYclovir  (VALTREX ) 1000 MG tablet    Sig: Take 1 tablet (1,000 mg total) by mouth 3 (three) times daily for 7 days.    Dispense:  21 tablet    Refill:  0     *If you need refills on other medications prior to your next appointment, please contact your pharmacy*  Follow-Up: Call back or seek an in-person evaluation if the symptoms worsen or if the condition fails to improve as anticipated.  [facility] [phone]   If you have been instructed to have an  in-person evaluation today at a local Urgent Care facility, please use the link below. It will take you to a list of all of our available [facility], including address, phone number and hours of operation. Please do not delay care.  [facility]  If you or a family member do not have a primary care provider, use the link below to schedule a visit and establish care. When you choose a [facility] primary care physician or advanced practice provider, you gain a long-term partner in health. Find a Primary Care Provider  Learn more about [facility] in-office and virtual care options: [facility] - Get Care Now

## 2024-08-02 ENCOUNTER — Ambulatory Visit: Admitting: Internal Medicine

## 2024-08-14 ENCOUNTER — Other Ambulatory Visit: Payer: Self-pay | Admitting: Internal Medicine

## 2024-08-17 NOTE — Telephone Encounter (Signed)
 No longer current dosing of this medication Requested Prescriptions  Pending Prescriptions Disp Refills   buPROPion  (WELLBUTRIN  SR) 150 MG 12 hr tablet [Pharmacy Med Name: BUPROPION  SR 150MG  TABLETS (12 H)] 180 tablet 1    Sig: TAKE 1 TABLET(150 MG) BY MOUTH TWICE DAILY     Psychiatry: Antidepressants - bupropion  Passed - 08/17/2024 10:24 AM      Passed - Cr in normal range and within 360 days    Creat  Date Value Ref Range Status  06/16/2024 0.97 0.50 - 0.99 mg/dL Final   Creatinine, POC  Date Value Ref Range Status  03/09/2018 100 mg/dL Final   Creatinine, Urine  Date Value Ref Range Status  06/16/2024 60 20 - 275 mg/dL Final         Passed - AST in normal range and within 360 days    AST  Date Value Ref Range Status  06/16/2024 11 10 - 35 U/L Final         Passed - ALT in normal range and within 360 days    ALT  Date Value Ref Range Status  06/16/2024 8 6 - 29 U/L Final         Passed - Last BP in normal range    BP Readings from Last 1 Encounters:  06/16/24 112/74         Passed - Valid encounter within last 6 months    Recent Outpatient Visits           2 months ago Type 2 diabetes mellitus with hyperglycemia, without long-term current use of insulin  [facility])   [facility] [place], Angeline ORN, NP   6 months ago Encounter for general adult medical examination with abnormal findings   [facility] [place], Angeline ORN, NP   6 months ago Contact dermatitis due to plant   [facility] [place], Angeline ORN, NP

## 2025-01-31 ENCOUNTER — Encounter: Admitting: Internal Medicine
